# Patient Record
Sex: Female | Born: 1985 | Race: White | Hispanic: No | Marital: Married | Smoking: Never smoker
Health system: Southern US, Community
[De-identification: ages and names within clinical notes are randomized; demographics above are authoritative.]

## PROBLEM LIST (undated history)

## (undated) DIAGNOSIS — N200 Calculus of kidney: Secondary | ICD-10-CM

## (undated) DIAGNOSIS — N809 Endometriosis, unspecified: Secondary | ICD-10-CM

## (undated) DIAGNOSIS — F329 Major depressive disorder, single episode, unspecified: Secondary | ICD-10-CM

## (undated) DIAGNOSIS — K5190 Ulcerative colitis, unspecified, without complications: Secondary | ICD-10-CM

## (undated) DIAGNOSIS — K649 Unspecified hemorrhoids: Secondary | ICD-10-CM

## (undated) DIAGNOSIS — R519 Headache, unspecified: Secondary | ICD-10-CM

## (undated) DIAGNOSIS — R51 Headache: Secondary | ICD-10-CM

## (undated) DIAGNOSIS — R112 Nausea with vomiting, unspecified: Secondary | ICD-10-CM

## (undated) DIAGNOSIS — K219 Gastro-esophageal reflux disease without esophagitis: Secondary | ICD-10-CM

## (undated) DIAGNOSIS — N189 Chronic kidney disease, unspecified: Secondary | ICD-10-CM

## (undated) DIAGNOSIS — Z87442 Personal history of urinary calculi: Secondary | ICD-10-CM

## (undated) DIAGNOSIS — R109 Unspecified abdominal pain: Secondary | ICD-10-CM

## (undated) DIAGNOSIS — F419 Anxiety disorder, unspecified: Secondary | ICD-10-CM

## (undated) DIAGNOSIS — G43909 Migraine, unspecified, not intractable, without status migrainosus: Secondary | ICD-10-CM

## (undated) DIAGNOSIS — Z9889 Other specified postprocedural states: Secondary | ICD-10-CM

## (undated) DIAGNOSIS — T7840XA Allergy, unspecified, initial encounter: Secondary | ICD-10-CM

## (undated) DIAGNOSIS — F32A Depression, unspecified: Secondary | ICD-10-CM

## (undated) HISTORY — DX: Endometriosis, unspecified: N80.9

## (undated) HISTORY — DX: Calculus of kidney: N20.0

## (undated) HISTORY — DX: Major depressive disorder, single episode, unspecified: F32.9

## (undated) HISTORY — PX: DIAGNOSTIC LAPAROSCOPY: SUR761

## (undated) HISTORY — PX: TONSILLECTOMY: SHX5217

## (undated) HISTORY — PX: COSMETIC SURGERY: SHX468

## (undated) HISTORY — DX: Unspecified abdominal pain: R10.9

## (undated) HISTORY — DX: Migraine, unspecified, not intractable, without status migrainosus: G43.909

## (undated) HISTORY — DX: Ulcerative colitis, unspecified, without complications: K51.90

## (undated) HISTORY — DX: Anxiety disorder, unspecified: F41.9

## (undated) HISTORY — PX: TONSILLECTOMY: SUR1361

## (undated) HISTORY — DX: Gastro-esophageal reflux disease without esophagitis: K21.9

## (undated) HISTORY — DX: Depression, unspecified: F32.A

## (undated) HISTORY — DX: Allergy, unspecified, initial encounter: T78.40XA

## (undated) HISTORY — PX: ABDOMINAL HYSTERECTOMY: SHX81

## (undated) HISTORY — DX: Unspecified hemorrhoids: K64.9

## (undated) HISTORY — DX: Chronic kidney disease, unspecified: N18.9

---
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
## 2005-02-14 ENCOUNTER — Ambulatory Visit: Payer: Self-pay | Admitting: General Surgery

## 2006-05-23 ENCOUNTER — Ambulatory Visit: Payer: Self-pay | Admitting: Unknown Physician Specialty

## 2006-11-23 ENCOUNTER — Ambulatory Visit: Payer: Self-pay

## 2007-01-18 HISTORY — PX: PELVIC LAPAROSCOPY: SHX162

## 2007-07-13 ENCOUNTER — Ambulatory Visit: Payer: Self-pay | Admitting: Unknown Physician Specialty

## 2007-07-26 ENCOUNTER — Ambulatory Visit: Payer: Self-pay | Admitting: Unknown Physician Specialty

## 2007-11-30 ENCOUNTER — Ambulatory Visit: Payer: Self-pay | Admitting: Obstetrics and Gynecology

## 2007-12-06 ENCOUNTER — Ambulatory Visit: Payer: Self-pay | Admitting: Obstetrics and Gynecology

## 2008-08-17 HISTORY — PX: BUNIONECTOMY: SHX129

## 2008-11-04 ENCOUNTER — Ambulatory Visit: Payer: Self-pay | Admitting: Otolaryngology

## 2009-04-08 ENCOUNTER — Ambulatory Visit: Payer: Self-pay | Admitting: Unknown Physician Specialty

## 2011-01-18 HISTORY — PX: DILATION AND CURETTAGE OF UTERUS: SHX78

## 2011-01-18 HISTORY — PX: APPENDECTOMY: SHX54

## 2011-03-03 ENCOUNTER — Ambulatory Visit: Payer: Self-pay

## 2011-03-03 LAB — HCG, QUANTITATIVE, PREGNANCY: Beta Hcg, Quant.: <1 m[IU]/mL — ABNORMAL LOW

## 2011-03-10 ENCOUNTER — Ambulatory Visit: Payer: Self-pay

## 2011-03-14 LAB — PATHOLOGY REPORT

## 2011-03-29 ENCOUNTER — Ambulatory Visit: Payer: Self-pay | Admitting: General Practice

## 2011-06-24 HISTORY — PX: INTRAUTERINE DEVICE INSERTION: SHX323

## 2012-01-18 HISTORY — PX: COLONOSCOPY: SHX174

## 2012-06-19 ENCOUNTER — Ambulatory Visit: Payer: Self-pay | Admitting: Oncology

## 2012-06-19 LAB — CBC CANCER CENTER
Basophil #: 0.1 x10 3/mm (ref 0.0–0.1)
Eosinophil #: 0.1 x10 3/mm (ref 0.0–0.7)
HGB: 12.9 g/dL (ref 12.0–16.0)
Lymphocyte #: 3.3 x10 3/mm (ref 1.0–3.6)
Platelet: 270 x10 3/mm (ref 150–440)
WBC: 8.1 x10 3/mm (ref 3.6–11.0)

## 2012-06-19 LAB — PROTIME-INR
INR: 0.9
Prothrombin Time: 12.8 secs (ref 11.5–14.7)

## 2012-07-04 ENCOUNTER — Ambulatory Visit (INDEPENDENT_AMBULATORY_CARE_PROVIDER_SITE_OTHER): Payer: BC Managed Care – PPO | Admitting: Certified Nurse Midwife

## 2012-07-04 ENCOUNTER — Encounter: Payer: Self-pay | Admitting: Certified Nurse Midwife

## 2012-07-04 VITALS — BP 90/60 | HR 68 | Temp 98.6°F | Resp 16 | Ht 66.5 in | Wt 127.0 lb

## 2012-07-04 DIAGNOSIS — N760 Acute vaginitis: Secondary | ICD-10-CM

## 2012-07-04 DIAGNOSIS — Z202 Contact with and (suspected) exposure to infections with a predominantly sexual mode of transmission: Secondary | ICD-10-CM

## 2012-07-04 DIAGNOSIS — N390 Urinary tract infection, site not specified: Secondary | ICD-10-CM

## 2012-07-04 LAB — POCT URINALYSIS DIPSTICK
Ketones, UA: NEGATIVE
Leukocytes, UA: NEGATIVE
Protein, UA: NEGATIVE
Urobilinogen, UA: NEGATIVE
pH, UA: 5

## 2012-07-04 NOTE — Progress Notes (Signed)
27 y.o.SingleCaucasian female G0P0000 with a 14 day(s) history of the following:discharge described as grey, milky and with odor and slight vaginal soreness Sexually active: yes Last sexual activity:6 days ago. Pt also reports the following associated symptoms: urinary frequency and urinary urgency Patient has tried Diflucan X 2 she had on hand with minimal relief. History IC so thought urniary symptoms from that.  Denies fever or chills or pain with urination. New partner without condom use, desires GC,Chlamydia use.  Contraception Mirena IUD use  O: Healthy female WDWN Affect: normal, orientation x3 Skin: warm and dry Abdomen: not tender, negative suprapubic    Exam:  [id number]:[id number], Urethra, Skene's normal, slight edema noted of labia with healing abrasions, no lesions                Vag:no lesions, discharge: copious, malodorous and grey in color, pH 5.5, wet prep done                Cx:  normal appearance and non tender IUD string noted                Uterus:non-tender, normal shape and consistency, retroverted                Adnexa: normal adnexa and no mass, fullness, tenderness  Wet Prep shows:clue cells, negative for yeast or trich   A::Bacterial vaginosis 2-Urinary frequency history of IC R/O UTI 3-STD screening 4-Contraception Mirena IUD  P: Reviewed findings and importance of condom use especially with IUD contraception Rx Metrogel see order Lab:GC, Chlamydia Urine culture, aware of signs and symptoms of UTI  Symptomatic local care discussed. Transport planner distributed. Vaginal antibiotic see orders. Discussed safe sex.  RV prn   Reviewed, TL

## 2012-07-06 LAB — IPS N GONORRHOEA AND CHLAMYDIA BY PCR

## 2012-07-06 LAB — URINE CULTURE: Colony Count: NO GROWTH

## 2012-07-17 ENCOUNTER — Ambulatory Visit: Payer: Self-pay | Admitting: Oncology

## 2012-08-03 ENCOUNTER — Ambulatory Visit: Payer: Self-pay | Admitting: General Practice

## 2012-08-13 ENCOUNTER — Encounter: Payer: Self-pay | Admitting: *Deleted

## 2012-08-24 ENCOUNTER — Encounter: Payer: Self-pay | Admitting: Certified Nurse Midwife

## 2012-08-24 ENCOUNTER — Ambulatory Visit (INDEPENDENT_AMBULATORY_CARE_PROVIDER_SITE_OTHER): Payer: BC Managed Care – PPO | Admitting: Certified Nurse Midwife

## 2012-08-24 VITALS — BP 106/56 | HR 74 | Resp 14 | Ht 67.0 in | Wt 128.0 lb

## 2012-08-24 DIAGNOSIS — Z01419 Encounter for gynecological examination (general) (routine) without abnormal findings: Secondary | ICD-10-CM

## 2012-08-24 NOTE — Patient Instructions (Signed)
General topics  Next pap or exam is  due in 1 year Take a Women's multivitamin Take 1200 mg. of calcium daily - prefer dietary If any concerns in interim to call back  Breast Self-Awareness Practicing breast self-awareness may pick up problems early, prevent significant medical complications, and possibly save your life. By practicing breast self-awareness, you can become familiar with how your breasts look and feel and if your breasts are changing. This allows you to notice changes early. It can also offer you some reassurance that your breast health is good. One way to learn what is normal for your breasts and whether your breasts are changing is to do a breast self-exam. If you find a lump or something that was not present in the past, it is best to contact your caregiver right away. Other findings that should be evaluated by your caregiver include nipple discharge, especially if it is bloody; skin changes or reddening; areas where the skin seems to be pulled in (retracted); or new lumps and bumps. Breast pain is seldom associated with cancer (malignancy), but should also be evaluated by a caregiver. BREAST SELF-EXAM The best time to examine your breasts is 5 7 days after your menstrual period is over.  ExitCare Patient Information 2013 [employer].   Exercise to Stay Healthy Exercise helps you become and stay healthy. EXERCISE IDEAS AND TIPS Choose exercises that:  You enjoy.  Fit into your day. You do not need to exercise really hard to be healthy. You can do exercises at a slow or medium level and stay healthy. You can:  Stretch before and after working out.  Try yoga, Pilates, or tai chi.  Lift weights.  Walk fast, swim, jog, run, climb stairs, bicycle, dance, or rollerskate.  Take aerobic classes. Exercises that burn about 150 calories:  Running 1  miles in 15 minutes.  Playing volleyball for 45 to 60 minutes.  Washing and waxing a car for 45 to 60  minutes.  Playing touch football for 45 minutes.  Walking 1  miles in 35 minutes.  Pushing a stroller 1  miles in 30 minutes.  Playing basketball for 30 minutes.  Raking leaves for 30 minutes.  Bicycling 5 miles in 30 minutes.  Walking 2 miles in 30 minutes.  Dancing for 30 minutes.  Shoveling snow for 15 minutes.  Swimming laps for 20 minutes.  Walking up stairs for 15 minutes.  Bicycling 4 miles in 15 minutes.  Gardening for 30 to 45 minutes.  Jumping rope for 15 minutes.  Washing windows or floors for 45 to 60 minutes. Document Released: 02/05/2010 Document Revised: 03/28/2011 Document Reviewed: 02/05/2010 ExitCare Patient Information 2013 [employer].   Other topics ( that may be useful information):    Sexually Transmitted Disease Sexually transmitted disease (STD) refers to any infection that is passed from person to person during sexual activity. This may happen by way of saliva, semen, blood, vaginal mucus, or urine. Common STDs include:  Gonorrhea.  Chlamydia.  Syphilis.  HIV/AIDS.  Genital herpes.  Hepatitis B and C.  Trichomonas.  Human papillomavirus (HPV).  Pubic lice. CAUSES  An STD may be spread by bacteria, virus, or parasite. A person can get an STD by:  Sexual intercourse with an infected person.  Sharing sex toys with an infected person.  Sharing needles with an infected person.  Having intimate contact with the genitals, mouth, or rectal areas of an infected person. SYMPTOMS  Some people may not have any symptoms, but   they can still pass the infection to others. Different STDs have different symptoms. Symptoms include:  Painful or bloody urination.  Pain in the pelvis, abdomen, vagina, anus, throat, or eyes.  Skin rash, itching, irritation, growths, or sores (lesions). These usually occur in the genital or anal area.  Abnormal vaginal discharge.  Penile discharge in men.  Soft, flesh-colored skin growths in the  genital or anal area.  Fever.  Pain or bleeding during sexual intercourse.  Swollen glands in the groin area.  Yellow skin and eyes (jaundice). This is seen with hepatitis. DIAGNOSIS  To make a diagnosis, your caregiver may:  Take a medical history.  Perform a physical exam.  Take a specimen (culture) to be examined.  Examine a sample of discharge under a microscope.  Perform blood test TREATMENT   Chlamydia, gonorrhea, trichomonas, and syphilis can be cured with antibiotic medicine.  Genital herpes, hepatitis, and HIV can be treated, but not cured, with prescribed medicines. The medicines will lessen the symptoms.  Genital warts from HPV can be treated with medicine or by freezing, burning (electrocautery), or surgery. Warts may come back.  HPV is a virus and cannot be cured with medicine or surgery.However, abnormal areas may be followed very closely by your caregiver and may be removed from the cervix, vagina, or vulva through office procedures or surgery. If your diagnosis is confirmed, your recent sexual partners need treatment. This is true even if they are symptom-free or have a negative culture or evaluation. They should not have sex until their caregiver says it is okay. HOME CARE INSTRUCTIONS  All sexual partners should be informed, tested, and treated for all STDs.  Take your antibiotics as directed. Finish them even if you start to feel better.  Only take over-the-counter or prescription medicines for pain, discomfort, or fever as directed by your caregiver.  Rest.  Eat a balanced diet and drink enough fluids to keep your urine clear or pale yellow.  Do not have sex until treatment is completed and you have followed up with your caregiver. STDs should be checked after treatment.  Keep all follow-up appointments, Pap tests, and blood tests as directed by your caregiver.  Only use latex condoms and water-soluble lubricants during sexual activity. Do not use  petroleum jelly or oils.  Avoid alcohol and illegal drugs.  Get vaccinated for HPV and hepatitis. If you have not received these vaccines in the past, talk to your caregiver about whether one or both might be right for you.  Avoid risky sex practices that can break the skin. The only way to avoid getting an STD is to avoid all sexual activity.Latex condoms and dental dams (for oral sex) will help lessen the risk of getting an STD, but will not completely eliminate the risk. SEEK MEDICAL CARE IF:   You have a fever.  You have any new or worsening symptoms. Document Released: 03/26/2002 Document Revised: 03/28/2011 Document Reviewed: 04/02/2010 ExitCare Patient Information 2013 [employer].    Domestic Abuse You are being battered or abused if someone close to you hits, pushes, or physically hurts you in any way. You also are being abused if you are forced into activities. You are being sexually abused if you are forced to have sexual contact of any kind. You are being emotionally abused if you are made to feel worthless or if you are constantly threatened. It is important to remember that help is available. No one has the right to abuse you. PREVENTION OF FURTHER   ABUSE  Learn the warning signs of danger. This varies with situations but may include: the use of alcohol, threats, isolation from friends and family, or forced sexual contact. Leave if you feel that violence is going to occur.  If you are attacked or beaten, report it to the police so the abuse is documented. You do not have to press charges. The police can protect you while you or the attackers are leaving. Get the officer's name and badge number and a copy of the report.  Find someone you can trust and tell them what is happening to you: your caregiver, a nurse, clergy member, close friend or family member. Feeling ashamed is natural, but remember that you have done nothing wrong. No one deserves abuse. Document Released:  01/01/2000 Document Revised: 03/28/2011 Document Reviewed: 03/11/2010 ExitCare Patient Information 2013 [employer].    How Much is Too Much Alcohol? Drinking too much alcohol can cause injury, accidents, and health problems. These types of problems can include:   Car crashes.  Falls.  Family fighting (domestic violence).  Drowning.  Fights.  Injuries.  Burns.  Damage to certain organs.  Having a baby with birth defects. ONE DRINK CAN BE TOO MUCH WHEN YOU ARE:  Working.  Pregnant or breastfeeding.  Taking medicines. Ask your doctor.  Driving or planning to drive. If you or someone you know has a drinking problem, get help from a doctor.  Document Released: 10/30/2008 Document Revised: 03/28/2011 Document Reviewed: 10/30/2008 ExitCare Patient Information 2013 [employer].   Smoking Hazards Smoking cigarettes is extremely bad for your health. Tobacco smoke has over 200 known poisons in it. There are over 60 chemicals in tobacco smoke that cause cancer. Some of the chemicals found in cigarette smoke include:   Cyanide.  Benzene.  Formaldehyde.  Methanol (wood alcohol).  Acetylene (fuel used in welding torches).  Ammonia. Cigarette smoke also contains the poisonous gases nitrogen oxide and carbon monoxide.  Cigarette smokers have an increased risk of many serious medical problems and Smoking causes approximately:  90% of all lung cancer deaths in men.  80% of all lung cancer deaths in women.  90% of deaths from chronic obstructive lung disease. Compared with nonsmokers, smoking increases the risk of:  Coronary heart disease by 2 to 4 times.  Stroke by 2 to 4 times.  Men developing lung cancer by 23 times.  Women developing lung cancer by 13 times.  Dying from chronic obstructive lung diseases by 12 times.  . Smoking is the most preventable cause of death and disease in our society.  WHY IS SMOKING ADDICTIVE?  Nicotine is the chemical  agent in tobacco that is capable of causing addiction or dependence.  When you smoke and inhale, nicotine is absorbed rapidly into the bloodstream through your lungs. Nicotine absorbed through the lungs is capable of creating a powerful addiction. Both inhaled and non-inhaled nicotine may be addictive.  Addiction studies of cigarettes and spit tobacco show that addiction to nicotine occurs mainly during the teen years, when young people begin using tobacco products. WHAT ARE THE BENEFITS OF QUITTING?  There are many health benefits to quitting smoking.   Likelihood of developing cancer and heart disease decreases. Health improvements are seen almost immediately.  Blood pressure, pulse rate, and breathing patterns start returning to normal soon after quitting. QUITTING SMOKING   American Lung Association - 1-800-LUNGUSA  American Cancer Society - 1-800-ACS-2345 Document Released: 02/11/2004 Document Revised: 03/28/2011 Document Reviewed: 10/15/2008 ExitCare Patient Information 2013 ExitCare[employer]   LL[employer].   Stress Management Stress is a state of physical or mental tension that often results from changes in your life or normal routine. Some common causes of stress are:  Death of a loved one.  Injuries or severe illnesses.  Getting fired or changing jobs.  Moving into a new home. Other causes may be:  Sexual problems.  Business or financial losses.  Taking on a large debt.  Regular conflict with someone at home or at work.  Constant tiredness from lack of sleep. It is not just bad things that are stressful. It may be stressful to:  Win the lottery.  Get married.  Buy a new car. The amount of stress that can be easily tolerated varies from person to person. Changes generally cause stress, regardless of the types of change. Too much stress can affect your health. It may lead to physical or emotional problems. Too little stress (boredom) may also become stressful. SUGGESTIONS TO  REDUCE STRESS:  Talk things over with your family and friends. It often is helpful to share your concerns and worries. If you feel your problem is serious, you may want to get help from a professional counselor.  Consider your problems one at a time instead of lumping them all together. Trying to take care of everything at once may seem impossible. List all the things you need to do and then start with the most important one. Set a goal to accomplish 2 or 3 things each day. If you expect to do too many in a single day you will naturally fail, causing you to feel even more stressed.  Do not use alcohol or drugs to relieve stress. Although you may feel better for a short time, they do not remove the problems that caused the stress. They can also be habit forming.  Exercise regularly - at least 3 times per week. Physical exercise can help to relieve that "uptight" feeling and will relax you.  The shortest distance between despair and hope is often a good night's sleep.  Go to bed and get up on time allowing yourself time for appointments without being rushed.  Take a short "time-out" period from any stressful situation that occurs during the day. Close your eyes and take some deep breaths. Starting with the muscles in your face, tense them, hold it for a few seconds, then relax. Repeat this with the muscles in your neck, shoulders, hand, stomach, back and legs.  Take good care of yourself. Eat a balanced diet and get plenty of rest.  Schedule time for having fun. Take a break from your daily routine to relax. HOME CARE INSTRUCTIONS   Call if you feel overwhelmed by your problems and feel you can no longer manage them on your own.  Return immediately if you feel like hurting yourself or someone else. Document Released: 06/29/2000 Document Revised: 03/28/2011 Document Reviewed: 02/19/2007 ExitCare Patient Information 2013 [employer].   

## 2012-08-24 NOTE — Progress Notes (Signed)
27 y.o. G0P0000 Single Caucasian Fe here for annual exam. Periods so much better. No cramping at all!!  No partner at present, no STD concerns, screened in JU[place] 2014 negative. Happy with IUD choice. No. health issues today.  No LMP recorded.     Per patient 3/14     Sexually active: yes  The current method of family planning is OCP (estrogen/progesterone) and IUD.    Exercising: yes  cardio, weight training 5x/wk Smoker:  no  Health Maintenance: Pap:  08/17/11-NEG MMG:  none Colonoscopy:  03/2012 BMD:   none TDaP:  02/2010 Labs: PCP   reports that she has never smoked. She does not have any smokeless tobacco history on file. She reports that she does not drink alcohol or use illicit drugs.  Past Medical History  Diagnosis Date  . Ulcerative colitis   . Endometriosis   . GERD (gastroesophageal reflux disease)   . IC (interstitial cystitis)     Past Surgical History  Procedure Laterality Date  . Bunionectomy  08, 10  . Tonsillectomy    . Hernia repair  27  . Dilation and curettage of uterus  2013    polyp  . Appendectomy  2013    with laparoscopy @ [facility]  . Pelvic laparoscopy  2009    times 2    Current Outpatient Prescriptions  Medication Sig Dispense Refill  . ibuprofen (ADVIL,MOTRIN) 200 MG tablet Take 200 mg by mouth every 6 (six) hours as needed for pain.      Marland Kitchen levonorgestrel (MIRENA) 20 MCG/24HR IUD 1 each by Intrauterine route once.      . Meth-Hyo-M Bl-Na Phos-Ph Sal (URIBEL PO) Take by mouth as needed.      . Norethindrone-Ethinyl Estradiol-Fe Biphas (LO LOESTRIN FE) 1 MG-10 MCG / 10 MCG tablet Take 1 tablet by mouth daily.      . pantoprazole (PROTONIX) 40 MG tablet Take 40 mg by mouth daily as needed.       No current facility-administered medications for this visit.    Family History  Problem Relation Age of Onset  . Breast cancer Mother 48  . Cancer Paternal Grandmother     colon  . Diabetes Paternal Grandmother     ROS:  Pertinent items are  noted in HPI.  Otherwise, a comprehensive ROS was negative.  Exam:   There were no vitals taken for this visit.    Ht Readings from Last 3 Encounters:  07/04/12 5' 6.5" (1.689 m)    General appearance: alert, cooperative and appears stated age Head: Normocephalic, without obvious abnormality, atraumatic Neck: no adenopathy, supple, symmetrical, trachea midline and thyroid normal to inspection and palpation Lungs: clear to auscultation bilaterally Breasts: normal appearance, no masses or tenderness, No nipple retraction or dimpling, No nipple discharge or bleeding, No axillary or supraclavicular adenopathy Heart: regular rate and rhythm Abdomen: soft, non-tender; no masses,  no organomegaly Extremities: extremities normal, atraumatic, no cyanosis or edema Skin: Skin color, texture, turgor normal. No rashes or lesions Lymph nodes: Cervical, supraclavicular, and axillary nodes normal. No abnormal inguinal nodes palpated Neurologic: Grossly normal   Pelvic: External genitalia:  no lesions              Urethra:  normal appearing urethra with no masses, tenderness or lesions              Bartholin's and Skene's: normal                 Vagina: normal appearing  vagina with normal color and discharge, no lesions              Cervix: normal, IUD string visualized              Pap taken: no Bimanual Exam:  Uterus:  normal size, contour, position, consistency, mobility, non-tender and anteverted              Adnexa: normal adnexa and no mass, fullness, tenderness               Rectovaginal: Confirms               Anus:  normal sphincter tone, no lesions  A:  Well Woman with normal exam  Contraception Mirena IUD  History of Menorrhagia on OCP with PCP in her area   for  Management    P:  Reviewed health and wellness pertinent to exam  Aware of warning signs with IUD  Declines Rx  Pap smear as per guidelines   pap smear not taken today  counseled on breast self exam, STD prevention,  use and side effects of OCP's, adequate intake of calcium and vitamin D, diet and exercise  return annually or prn  An After Visit Summary was printed and given to the patient.

## 2012-08-25 NOTE — Progress Notes (Signed)
Note reviewed, agree with plan.  Maaliyah Adolph, MD  

## 2012-08-29 ENCOUNTER — Encounter: Payer: Self-pay | Admitting: General Surgery

## 2012-08-29 ENCOUNTER — Ambulatory Visit (INDEPENDENT_AMBULATORY_CARE_PROVIDER_SITE_OTHER): Payer: BC Managed Care – PPO | Admitting: General Surgery

## 2012-08-29 VITALS — BP 100/68 | HR 74 | Resp 12 | Ht 67.0 in | Wt 129.0 lb

## 2012-08-29 DIAGNOSIS — R109 Unspecified abdominal pain: Secondary | ICD-10-CM | POA: Insufficient documentation

## 2012-08-29 DIAGNOSIS — K649 Unspecified hemorrhoids: Secondary | ICD-10-CM | POA: Insufficient documentation

## 2012-08-29 DIAGNOSIS — R1011 Right upper quadrant pain: Secondary | ICD-10-CM

## 2012-08-29 NOTE — Progress Notes (Signed)
Patient ID: Bethany Harrison, female   DOB: 22-Apr-1985, 27 y.o.   MRN: [id number]  Chief Complaint  Patient presents with  . Other    Pain in old surgical site of abdomen    HPI Bethany Harrison is a 27 y.o. female who presents for an evaluation of abdomen pain located in old surgical site. The surgical site is from laparoscopic surgery for endometriosis.The patient states this has been ongoing for approximately 1 year. The pain comes and goes. The pain has not gotten worse but continues to to be bothersome.  The patient underwent laparoscopy and incidental appendectomy at [facility] last year. She's had tenderness in the right upper quadrant port site since that time. She reports that she was evaluated by the treating physician on at least 2 occasions without any satisfactory explanation.  The discomfort is local, usually worse with vigorous activities such as exercise.  She has had no GI symptoms.  HPI  Past Medical History  Diagnosis Date  . Ulcerative colitis   . Endometriosis   . GERD (gastroesophageal reflux disease)   . IC (interstitial cystitis)   . Kidney stone   . Hemorrhoid   . Abdominal pain     Past Surgical History  Procedure Laterality Date  . Bunionectomy  08, 10  . Tonsillectomy    . Hernia repair  47  . Dilation and curettage of uterus  2013    polyp  . Appendectomy  2013    with laparoscopy @ [facility]  . Pelvic laparoscopy  2009    times 2  . Colonoscopy  2014    Family History  Problem Relation Age of Onset  . Breast cancer Mother 88  . Cancer Paternal Grandmother     colon  . Diabetes Paternal Grandmother     Social History History  Substance Use Topics  . Smoking status: Never Smoker   . Smokeless tobacco: Not on file  . Alcohol Use: Yes    Allergies  Allergen Reactions  . Sulfa Antibiotics Nausea Only    Current Outpatient Prescriptions  Medication Sig Dispense Refill  . levonorgestrel (MIRENA) 20 MCG/24HR IUD 1 each by Intrauterine  route once.      . Meth-Hyo-M Bl-Na Phos-Ph Sal (URIBEL PO) Take by mouth as needed.      . Norethindrone-Ethinyl Estradiol-Fe Biphas (LO LOESTRIN FE) 1 MG-10 MCG / 10 MCG tablet Take 1 tablet by mouth daily.      . pantoprazole (PROTONIX) 40 MG tablet Take 40 mg by mouth daily as needed.       No current facility-administered medications for this visit.    Review of Systems Review of Systems  Constitutional: Negative.   Respiratory: Negative.   Cardiovascular: Negative.   Gastrointestinal: Positive for abdominal pain.    Blood pressure 100/68, pulse 74, resp. rate 12, height 5' 7"  (1.702 m), weight 129 lb (58.514 kg).  Physical Exam Physical Exam  Constitutional: She is oriented to person, place, and time. She appears well-developed and well-nourished.  Neck: No thyromegaly present.  Cardiovascular: Normal rate, regular rhythm and normal heart sounds.   No murmur heard. Pulmonary/Chest: Effort normal and breath sounds normal.  Abdominal: Soft. Normal appearance and bowel sounds are normal.  Lymphadenopathy:    She has no cervical adenopathy.  Neurological: She is alert and oriented to person, place, and time.  Skin: Skin is warm and dry.  The port sites are all well healed without focal thickening or point tenderness. No evidence of abdominal  wall hernias.  Data Reviewed CT scan of the abdomen and pelvis dated July 04, 2012 was reviewed. No area of soft tissue abnormality or muscular abnormality in the area of the right upper quadrant port site was identified. Mild changes are appreciated the hypogastric site. No bowel hernias were identified.  Assessment    Abdominal wall pain likely secondary to scarring between the lateral bowel wall musculature.     Plan    Observation for management were reviewed: 1) observation versus 2) local corticosteroid injection. The patient reports the pain is not severe enough at this time toward injection.  She contact the office if she has  any concerns.        Robert Bellow 08/29/2012, 10:45 PM

## 2012-08-29 NOTE — Patient Instructions (Signed)
Patient to return as needed. 

## 2012-11-22 ENCOUNTER — Other Ambulatory Visit: Payer: Self-pay

## 2013-03-15 ENCOUNTER — Emergency Department: Payer: Self-pay | Admitting: Emergency Medicine

## 2013-07-22 ENCOUNTER — Ambulatory Visit (INDEPENDENT_AMBULATORY_CARE_PROVIDER_SITE_OTHER): Payer: BC Managed Care – PPO | Admitting: Nurse Practitioner

## 2013-07-22 ENCOUNTER — Encounter: Payer: Self-pay | Admitting: Nurse Practitioner

## 2013-07-22 VITALS — BP 110/66 | HR 80 | Temp 99.0°F | Ht 67.0 in | Wt 121.0 lb

## 2013-07-22 DIAGNOSIS — N760 Acute vaginitis: Secondary | ICD-10-CM

## 2013-07-22 DIAGNOSIS — A499 Bacterial infection, unspecified: Secondary | ICD-10-CM

## 2013-07-22 DIAGNOSIS — B9689 Other specified bacterial agents as the cause of diseases classified elsewhere: Secondary | ICD-10-CM

## 2013-07-22 MED ORDER — METRONIDAZOLE 0.75 % VA GEL: 1.0000 | Freq: Every day | VAGINAL | Status: DC

## 2013-07-22 NOTE — Patient Instructions (Signed)
Bacterial Vaginosis Bacterial vaginosis is a vaginal infection that occurs when the normal balance of bacteria in the vagina is disrupted. It results from an overgrowth of certain bacteria. This is the most common vaginal infection in women of childbearing age. Treatment is important to prevent complications, especially in pregnant women, as it can cause a premature delivery. CAUSES  Bacterial vaginosis is caused by an increase in harmful bacteria that are normally present in smaller amounts in the vagina. Several different kinds of bacteria can cause bacterial vaginosis. However, the reason that the condition develops is not fully understood. RISK FACTORS Certain activities or behaviors can put you at an increased risk of developing bacterial vaginosis, including:  Having a new sex partner or multiple sex partners.  Douching.  Using an intrauterine device (IUD) for contraception. Women do not get bacterial vaginosis from toilet seats, bedding, swimming pools, or contact with objects around them. SIGNS AND SYMPTOMS  Some women with bacterial vaginosis have no signs or symptoms. Common symptoms include:  Grey vaginal discharge.  A fishlike odor with discharge, especially after sexual intercourse.  Itching or burning of the vagina and vulva.  Burning or pain with urination. DIAGNOSIS  Your health care provider will take a medical history and examine the vagina for signs of bacterial vaginosis. A sample of vaginal fluid may be taken. Your health care provider will look at this sample under a microscope to check for bacteria and abnormal cells. A vaginal pH test may also be done.  TREATMENT  Bacterial vaginosis may be treated with antibiotic medicines. These may be given in the form of a pill or a vaginal cream. A second round of antibiotics may be prescribed if the condition comes back after treatment.  HOME CARE INSTRUCTIONS   Only take over-the-counter or prescription medicines as  directed by your health care provider.  If antibiotic medicine was prescribed, take it as directed. Make sure you finish it even if you start to feel better.  Do not have sex until treatment is completed.  Tell all sexual partners that you have a vaginal infection. They should see their health care provider and be treated if they have problems, such as a mild rash or itching.  Practice safe sex by using condoms and only having one sex partner. SEEK MEDICAL CARE IF:   Your symptoms are not improving after 3 days of treatment.  You have increased discharge or pain.  You have a fever. MAKE SURE YOU:   Understand these instructions.  Will watch your condition.  Will get help right away if you are not doing well or get worse. FOR MORE INFORMATION  Centers for Disease Control and Prevention, Division of STD Prevention: www.cdc.gov/std American Sexual Health Association (ASHA): www.ashastd.org  Document Released: 01/03/2005 Document Revised: 10/24/2012 Document Reviewed: 08/15/2012 [employer] Patient Information 2015 [employer]. This information is not intended to replace advice given to you by your health care provider. Make sure you discuss any questions you have with your health care provider.  

## 2013-07-22 NOTE — Progress Notes (Signed)
28 y.o.Single Caucasian female [id number] with a 6 day(s) history of the following:discharge described as white, clear and and always spotting since IUD and local irritation.   Sexually active: Yes.   Last sexual activity:2 weeks ago.   No change in partner and always uses condoms.  No past problems with condoms or latex allergy.  She has these same symptoms usually every summer when she is busy and getting hot.  Pt also reports the following associated symptoms: none.  Has no UTI symptoms, fever or chills.   Patient has tried over the counter oatmeal baths x 2 days treatment with minimal relief.     Exam:  ERD:EYCXKG                YJE:HUDJSHFWY: scant, bloody and thin, blood, wet prep done                Cx:  normal appearance, IUD string visible                Uterus:normal size                Adnexa: no mass, fullness, tenderness  Wet Prep shows:PH: 5.5; NSS: + clue cells and RBC's, KOH: no yeast   Dx:     Bacterial Vaginosis  Mirena IUD   TX   :Vaginal antibiotic see orders.  Metrogel vaginal cream HS X 5  She has Diflucan RX on hand in case of yeast vaginitis

## 2013-07-23 NOTE — Progress Notes (Signed)
Encounter reviewed by Dr. Brook Silva.  

## 2013-07-26 ENCOUNTER — Telehealth: Payer: Self-pay | Admitting: Certified Nurse Midwife

## 2013-07-26 NOTE — Telephone Encounter (Signed)
Pt was seen on 07/22/13 and treated for bacterial infection and feels like she is not better after using the gel is this normal?

## 2013-07-26 NOTE — Telephone Encounter (Signed)
Spoke with patient. Advised patient will need to complete full five says of metrogel and that relief will be gradual. Advised we are treating with medication for one week and it may take body another week to regain balance. Advised is patient is still having symptoms after this point or if symptoms are worsening to give [place] a call back. Patient agreeable and verbalizes understanding.  Routing to provider for final review. Patient agreeable to disposition. Will close encounter

## 2013-07-30 ENCOUNTER — Telehealth: Payer: Self-pay | Admitting: *Deleted

## 2013-07-30 NOTE — Telephone Encounter (Signed)
Patient calling to speak with nurse about her symptoms "still not getting any better." Please advise.

## 2013-07-30 NOTE — Telephone Encounter (Signed)
She needs to use her Diflucan (she has on hand) as it sound like she has a yeast vaginitis following the Metrogel.  If needs RX for 2 tabs of Diflucan OK.  Again if not better to call back.

## 2013-07-30 NOTE — Telephone Encounter (Signed)
See next phone note for triage info since this encounter is already closed.

## 2013-07-30 NOTE — Telephone Encounter (Signed)
See phone note from 07-26-13 for prev call info. Patient seen 07-22-13 with Patty and given Metrogel X5 nights. She called 07-26-13 to say she was not better. She was instructed to give it more time and call back if not better. Patient calling today and states she is better but certainly not resolved. Thick white vaginal discharge, external irrtiation and discomfort with wiping.  Denies itching unless area is touched. Very irritated and tender. Denies pain or fever.  Please advise.

## 2013-07-30 NOTE — Telephone Encounter (Signed)
Call to patient with Bethany Harrison's instruction. Patient states she is on Diflucan from PCP for another issue.  Takes 2 tabs of 150mg  q week for three weeks, total of 6 tabs. Took this last Thursday and is scheduled to repeat it this Thursday and next. Advised since already treating with diflucan, may not need to do anything else. May need to give more time to allow this to clear and all meds to work. Will review with Bethany Harrison and call her back if additional instructions.   Please advise.

## 2013-08-01 NOTE — Telephone Encounter (Signed)
Patty out of office till Monday 08-05-13. Routed to dr Sabra Heck for review. Anything else needed?

## 2013-08-01 NOTE — Telephone Encounter (Signed)
Patty, was there any other info you wanted to tell patient. She is on weekly Diflucan so i did not give her any additional.

## 2013-08-02 ENCOUNTER — Ambulatory Visit (INDEPENDENT_AMBULATORY_CARE_PROVIDER_SITE_OTHER): Payer: BC Managed Care – PPO | Admitting: Certified Nurse Midwife

## 2013-08-02 ENCOUNTER — Encounter: Payer: Self-pay | Admitting: Certified Nurse Midwife

## 2013-08-02 VITALS — BP 110/74 | HR 68 | Resp 16 | Ht 67.0 in | Wt 122.0 lb

## 2013-08-02 DIAGNOSIS — N760 Acute vaginitis: Secondary | ICD-10-CM

## 2013-08-02 NOTE — Telephone Encounter (Signed)
Message left to return call to Alean Kromer at [phone].    

## 2013-08-02 NOTE — Progress Notes (Signed)
Reviewed personally.  M. Suzanne Shahzaib Azevedo, MD.  

## 2013-08-02 NOTE — Telephone Encounter (Signed)
Spoke with patient. She continues with vaginal discharge and irriation. Has taken Diflucan x 4.  Agreeable to office visit but cannot come for morning appointment. Offered office visit for 1530 with Regina Eck CNM, patient agreeable.  Routing to Dr. Sabra Heck and Regina Eck CNM  for update. Will close encounter.

## 2013-08-02 NOTE — Telephone Encounter (Signed)
Can you call and see if she wants to be seen today.

## 2013-08-02 NOTE — Progress Notes (Signed)
28 y.o. single white female g0p0 here with complaint of vaginal symptoms of increase discharge. Describes discharge as white with some brownish color, which is normal for her with IUD. Onset of symptoms 3 days ago. Denies new personal products. Patient was treated one week ago for BV with Metrogel and experienced relief of symptoms, but noticed some irritation and white discharge and felt she had a yeast infection. Patient on Diflucan for Tinea Vesicolor with dermatology. No STD concerns or partner change. Patient treated previous in year for staph infection and feels immune system down with all the medications. Denies abdominal pain or urinary issues   O:Healthy female WDWN Affect: normal, orientation x 3  Exam: Abdomen:non tender Lymph node: no enlargement or tenderness Pelvic exam: External genital: no redness or tenderness BUS: negative Vagina: scant white brownish non odorous discharge noted. Ph:3.5   ,Wet prep taken Cervix: normal, non tender, IUD string noted Uterus: normal, non tender Adnexa:normal, non tender, no masses or fullness noted   Wet Prep results: negative for yeast or BV, lactobacillus noted   A:BV resolved Normal vaginal discharge   P:Discussed findings of negative results. Patient reassured. Discussed Aveeno or baking soda sitz bath for comfort. Avoid moist clothes or pads for extended period of time. If working out in gym clothes or swim suits for long periods of time change underwear or bottoms of swimsuit if possible. Patient encouraged to limit thong use (wears all the time)  Encouraged starting on oral refrigerated probiotic daily to increase immune factors. Questions addressed  Rv prn

## 2013-08-17 LAB — HM MAMMOGRAPHY

## 2013-08-27 ENCOUNTER — Other Ambulatory Visit: Payer: Self-pay

## 2013-08-27 ENCOUNTER — Ambulatory Visit (INDEPENDENT_AMBULATORY_CARE_PROVIDER_SITE_OTHER): Payer: BC Managed Care – PPO | Admitting: Certified Nurse Midwife

## 2013-08-27 ENCOUNTER — Encounter: Payer: Self-pay | Admitting: Certified Nurse Midwife

## 2013-08-27 VITALS — BP 110/70 | HR 72 | Resp 16 | Ht 66.75 in | Wt 124.0 lb

## 2013-08-27 DIAGNOSIS — N6320 Unspecified lump in the left breast, unspecified quadrant: Secondary | ICD-10-CM

## 2013-08-27 DIAGNOSIS — Z01419 Encounter for gynecological examination (general) (routine) without abnormal findings: Secondary | ICD-10-CM

## 2013-08-27 DIAGNOSIS — N63 Unspecified lump in unspecified breast: Secondary | ICD-10-CM

## 2013-08-27 DIAGNOSIS — Z124 Encounter for screening for malignant neoplasm of cervix: Secondary | ICD-10-CM

## 2013-08-27 DIAGNOSIS — Z0000 Encounter for general adult medical examination without abnormal findings: Secondary | ICD-10-CM

## 2013-08-27 NOTE — Patient Instructions (Addendum)
General topics  Next pap or exam is  due in 1 year Take a Women's multivitamin Take 1200 mg. of calcium daily - prefer dietary If any concerns in interim to call back  Breast Self-Awareness Practicing breast self-awareness may pick up problems early, prevent significant medical complications, and possibly save your life. By practicing breast self-awareness, you can become familiar with how your breasts look and feel and if your breasts are changing. This allows you to notice changes early. It can also offer you some reassurance that your breast health is good. One way to learn what is normal for your breasts and whether your breasts are changing is to do a breast self-exam. If you find a lump or something that was not present in the past, it is best to contact your caregiver right away. Other findings that should be evaluated by your caregiver include nipple discharge, especially if it is bloody; skin changes or reddening; areas where the skin seems to be pulled in (retracted); or new lumps and bumps. Breast pain is seldom associated with cancer (malignancy), but should also be evaluated by a caregiver. BREAST SELF-EXAM The best time to examine your breasts is 5 7 days after your menstrual period is over.  ExitCare Patient Information 2013 [employer].   Exercise to Stay Healthy Exercise helps you become and stay healthy. EXERCISE IDEAS AND TIPS Choose exercises that:  You enjoy.  Fit into your day. You do not need to exercise really hard to be healthy. You can do exercises at a slow or medium level and stay healthy. You can:  Stretch before and after working out.  Try yoga, Pilates, or tai chi.  Lift weights.  Walk fast, swim, jog, run, climb stairs, bicycle, dance, or rollerskate.  Take aerobic classes. Exercises that burn about 150 calories:  Running 1  miles in 15 minutes.  Playing volleyball for 45 to 60 minutes.  Washing and waxing a car for 45 to 60  minutes.  Playing touch football for 45 minutes.  Walking 1  miles in 35 minutes.  Pushing a stroller 1  miles in 30 minutes.  Playing basketball for 30 minutes.  Raking leaves for 30 minutes.  Bicycling 5 miles in 30 minutes.  Walking 2 miles in 30 minutes.  Dancing for 30 minutes.  Shoveling snow for 15 minutes.  Swimming laps for 20 minutes.  Walking up stairs for 15 minutes.  Bicycling 4 miles in 15 minutes.  Gardening for 30 to 45 minutes.  Jumping rope for 15 minutes.  Washing windows or floors for 45 to 60 minutes. Document Released: 02/05/2010 Document Revised: 03/28/2011 Document Reviewed: 02/05/2010 [employer] Patient Information 2013 [employer].   Other topics ( that may be useful information):    Sexually Transmitted Disease Sexually transmitted disease (STD) refers to any infection that is passed from person to person during sexual activity. This may happen by way of saliva, semen, blood, vaginal mucus, or urine. Common STDs include:  Gonorrhea.  Chlamydia.  Syphilis.  HIV/AIDS.  Genital herpes.  Hepatitis B and C.  Trichomonas.  Human papillomavirus (HPV).  Pubic lice. CAUSES  An STD may be spread by bacteria, virus, or parasite. A person can get an STD by:  Sexual intercourse with an infected person.  Sharing sex toys with an infected person.  Sharing needles with an infected person.  Having intimate contact with the genitals, mouth, or rectal areas of an infected person. SYMPTOMS  Some people may not have any symptoms, but   they can still pass the infection to others. Different STDs have different symptoms. Symptoms include:  Painful or bloody urination.  Pain in the pelvis, abdomen, vagina, anus, throat, or eyes.  Skin rash, itching, irritation, growths, or sores (lesions). These usually occur in the genital or anal area.  Abnormal vaginal discharge.  Penile discharge in men.  Soft, flesh-colored skin growths in the  genital or anal area.  Fever.  Pain or bleeding during sexual intercourse.  Swollen glands in the groin area.  Yellow skin and eyes (jaundice). This is seen with hepatitis. DIAGNOSIS  To make a diagnosis, your caregiver may:  Take a medical history.  Perform a physical exam.  Take a specimen (culture) to be examined.  Examine a sample of discharge under a microscope.  Perform blood test TREATMENT   Chlamydia, gonorrhea, trichomonas, and syphilis can be cured with antibiotic medicine.  Genital herpes, hepatitis, and HIV can be treated, but not cured, with prescribed medicines. The medicines will lessen the symptoms.  Genital warts from HPV can be treated with medicine or by freezing, burning (electrocautery), or surgery. Warts may come back.  HPV is a virus and cannot be cured with medicine or surgery.However, abnormal areas may be followed very closely by your caregiver and may be removed from the cervix, vagina, or vulva through office procedures or surgery. If your diagnosis is confirmed, your recent sexual partners need treatment. This is true even if they are symptom-free or have a negative culture or evaluation. They should not have sex until their caregiver says it is okay. HOME CARE INSTRUCTIONS  All sexual partners should be informed, tested, and treated for all STDs.  Take your antibiotics as directed. Finish them even if you start to feel better.  Only take over-the-counter or prescription medicines for pain, discomfort, or fever as directed by your caregiver.  Rest.  Eat a balanced diet and drink enough fluids to keep your urine clear or pale yellow.  Do not have sex until treatment is completed and you have followed up with your caregiver. STDs should be checked after treatment.  Keep all follow-up appointments, Pap tests, and blood tests as directed by your caregiver.  Only use latex condoms and water-soluble lubricants during sexual activity. Do not use  petroleum jelly or oils.  Avoid alcohol and illegal drugs.  Get vaccinated for HPV and hepatitis. If you have not received these vaccines in the past, talk to your caregiver about whether one or both might be right for you.  Avoid risky sex practices that can break the skin. The only way to avoid getting an STD is to avoid all sexual activity.Latex condoms and dental dams (for oral sex) will help lessen the risk of getting an STD, but will not completely eliminate the risk. SEEK MEDICAL CARE IF:   You have a fever.  You have any new or worsening symptoms. Document Released: 03/26/2002 Document Revised: 03/28/2011 Document Reviewed: 04/02/2010 [facility] Patient Information 2013 [place].    Domestic Abuse You are being battered or abused if someone close to you hits, pushes, or physically hurts you in any way. You also are being abused if you are forced into activities. You are being sexually abused if you are forced to have sexual contact of any kind. You are being emotionally abused if you are made to feel worthless or if you are constantly threatened. It is important to remember that help is available. No one has the right to abuse you. PREVENTION OF FURTHER  ABUSE  Learn the warning signs of danger. This varies with situations but may include: the use of alcohol, threats, isolation from friends and family, or forced sexual contact. Leave if you feel that violence is going to occur.  If you are attacked or beaten, report it to the police so the abuse is documented. You do not have to press charges. The police can protect you while you or the attackers are leaving. Get the officer's name and badge number and a copy of the report.  Find someone you can trust and tell them what is happening to you: your caregiver, a nurse, clergy member, close friend or family member. Feeling ashamed is natural, but remember that you have done nothing wrong. No one deserves abuse. Document Released:  01/01/2000 Document Revised: 03/28/2011 Document Reviewed: 03/11/2010 Exit[employer] Patient Information 2013 [employer].    How Much is Too Much Alcohol? Drinking too much alcohol can cause injury, accidents, and health problems. These types of problems can include:   Car crashes.  Falls.  Family fighting (domestic violence).  Drowning.  Fights.  Injuries.  Burns.  Damage to certain organs.  Having a baby with birth defects. ONE DRINK CAN BE TOO MUCH WHEN YOU ARE:  Working.  Pregnant or breastfeeding.  Taking medicines. Ask your doctor.  Driving or planning to drive. If you or someone you know has a drinking problem, get help from a doctor.  Document Released: 10/30/2008 Document Revised: 03/28/2011 Document Reviewed: 10/30/2008 Exit[employer] Patient Information 2013 [employer].   Smoking Hazards Smoking cigarettes is extremely bad for your health. Tobacco smoke has over 200 known poisons in it. There are over 60 chemicals in tobacco smoke that cause cancer. Some of the chemicals found in cigarette smoke include:   Cyanide.  Benzene.  Formaldehyde.  Methanol (wood alcohol).  Acetylene (fuel used in welding torches).  Ammonia. Cigarette smoke also contains the poisonous gases nitrogen oxide and carbon monoxide.  Cigarette smokers have an increased risk of many serious medical problems and Smoking causes approximately:  90% of all lung cancer deaths in men.  80% of all lung cancer deaths in women.  90% of deaths from chronic obstructive lung disease. Compared with nonsmokers, smoking increases the risk of:  Coronary heart disease by 2 to 4 times.  Stroke by 2 to 4 times.  Men developing lung cancer by 23 times.  Women developing lung cancer by 13 times.  Dying from chronic obstructive lung diseases by 12 times.  . Smoking is the most preventable cause of death and disease in our society.  WHY IS SMOKING ADDICTIVE?  Nicotine is the chemical  agent in tobacco that is capable of causing addiction or dependence.  When you smoke and inhale, nicotine is absorbed rapidly into the bloodstream through your lungs. Nicotine absorbed through the lungs is capable of creating a powerful addiction. Both inhaled and non-inhaled nicotine may be addictive.  Addiction studies of cigarettes and spit tobacco show that addiction to nicotine occurs mainly during the teen years, when young people begin using tobacco products. WHAT ARE THE BENEFITS OF QUITTING?  There are many health benefits to quitting smoking.   Likelihood of developing cancer and heart disease decreases. Health improvements are seen almost immediately.  Blood pressure, pulse rate, and breathing patterns start returning to normal soon after quitting. QUITTING SMOKING   American Lung Association - 1-800-LUNGUSA  American Cancer Society - 1-800-ACS-2345 Document Released: 02/11/2004 Document Revised: 03/28/2011 Document Reviewed: 10/15/2008 ExitCare Patient Information 2013 ExitCare,   LLC.   Stress Management Stress is a state of physical or mental tension that often results from changes in your life or normal routine. Some common causes of stress are:  Death of a loved one.  Injuries or severe illnesses.  Getting fired or changing jobs.  Moving into a new home. Other causes may be:  Sexual problems.  Business or financial losses.  Taking on a large debt.  Regular conflict with someone at home or at work.  Constant tiredness from lack of sleep. It is not just bad things that are stressful. It may be stressful to:  Win the lottery.  Get married.  Buy a new car. The amount of stress that can be easily tolerated varies from person to person. Changes generally cause stress, regardless of the types of change. Too much stress can affect your health. It may lead to physical or emotional problems. Too little stress (boredom) may also become stressful. SUGGESTIONS TO  REDUCE STRESS:  Talk things over with your family and friends. It often is helpful to share your concerns and worries. If you feel your problem is serious, you may want to get help from a professional counselor.  Consider your problems one at a time instead of lumping them all together. Trying to take care of everything at once may seem impossible. List all the things you need to do and then start with the most important one. Set a goal to accomplish 2 or 3 things each day. If you expect to do too many in a single day you will naturally fail, causing you to feel even more stressed.  Do not use alcohol or drugs to relieve stress. Although you may feel better for a short time, they do not remove the problems that caused the stress. They can also be habit forming.  Exercise regularly - at least 3 times per week. Physical exercise can help to relieve that "uptight" feeling and will relax you.  The shortest distance between despair and hope is often a good night's sleep.  Go to bed and get up on time allowing yourself time for appointments without being rushed.  Take a short "time-out" period from any stressful situation that occurs during the day. Close your eyes and take some deep breaths. Starting with the muscles in your face, tense them, hold it for a few seconds, then relax. Repeat this with the muscles in your neck, shoulders, hand, stomach, back and legs.  Take good care of yourself. Eat a balanced diet and get plenty of rest.  Schedule time for having fun. Take a break from your daily routine to relax. HOME CARE INSTRUCTIONS   Call if you feel overwhelmed by your problems and feel you can no longer manage them on your own.  Return immediately if you feel like hurting yourself or someone else. Document Released: 06/29/2000 Document Revised: 03/28/2011 Document Reviewed: 02/19/2007 [facility] Patient Information 2013 [place].       General topics  Next pap or exam is  due in 1  year Take a Women's multivitamin Take 1200 mg. of calcium daily - prefer dietary If any concerns in interim to call back  Breast Self-Awareness Practicing breast self-awareness may pick up problems early, prevent significant medical complications, and possibly save your life. By practicing breast self-awareness, you can become familiar with how your breasts look and feel and if your breasts are changing. This allows you to notice changes early. It can also offer you some reassurance that your breast  health is good. One way to learn what is normal for your breasts and whether your breasts are changing is to do a breast self-exam. If you find a lump or something that was not present in the past, it is best to contact your caregiver right away. Other findings that should be evaluated by your caregiver include nipple discharge, especially if it is bloody; skin changes or reddening; areas where the skin seems to be pulled in (retracted); or new lumps and bumps. Breast pain is seldom associated with cancer (malignancy), but should also be evaluated by a caregiver. BREAST SELF-EXAM The best time to examine your breasts is 5 7 days after your menstrual period is over.  ExitCare Patient Information 2013 [employer]Care[employer].   Exercise to Stay Healthy Exercise helps you become and stay healthy. EXERCISE IDEAS AND TIPS Choose exercises that:  You enjoy.  Fit into your day. You do not need to exercise really hard to be healthy. You can do exercises at a slow or medium level and stay healthy. You can:  Stretch before and after working out.  Try yoga, Pilates, or tai chi.  Lift weights.  Walk fast, swim, jog, run, climb stairs, bicycle, dance, or rollerskate.  Take aerobic classes. Exercises that burn about 150 calories:  Running 1  miles in 15 minutes.  Playing volleyball for 45 to 60 minutes.  Washing and waxing a car for 45 to 60 minutes.  Playing touch football for 45 minutes.  Walking 1   miles in 35 minutes.  Pushing a stroller 1  miles in 30 minutes.  Playing basketball for 30 minutes.  Raking leaves for 30 minutes.  Bicycling 5 miles in 30 minutes.  Walking 2 miles in 30 minutes.  Dancing for 30 minutes.  Shoveling snow for 15 minutes.  Swimming laps for 20 minutes.  Walking up stairs for 15 minutes.  Bicycling 4 miles in 15 minutes.  Gardening for 30 to 45 minutes.  Jumping rope for 15 minutes.  Washing windows or floors for 45 to 60 minutes. Document Released: 02/05/2010 Document Revised: 03/28/2011 Document Reviewed: 02/05/2010 ExitCare Patient Information 2013 [employer]Care, [employer].   Other topics ( that may be useful information):    Sexually Transmitted Disease Sexually transmitted disease (STD) refers to any infection that is passed from person to person during sexual activity. This may happen by way of saliva, semen, blood, vaginal mucus, or urine. Common STDs include:  Gonorrhea.  Chlamydia.  Syphilis.  HIV/AIDS.  Genital herpes.  Hepatitis B and C.  Trichomonas.  Human papillomavirus (HPV).  Pubic lice. CAUSES  An STD may be spread by bacteria, virus, or parasite. A person can get an STD by:  Sexual intercourse with an infected person.  Sharing sex toys with an infected person.  Sharing needles with an infected person.  Having intimate contact with the genitals, mouth, or rectal areas of an infected person. SYMPTOMS  Some people may not have any symptoms, but they can still pass the infection to others. Different STDs have different symptoms. Symptoms include:  Painful or bloody urination.  Pain in the pelvis, abdomen, vagina, anus, throat, or eyes.  Skin rash, itching, irritation, growths, or sores (lesions). These usually occur in the genital or anal area.  Abnormal vaginal discharge.  Penile discharge in men.  Soft, flesh-colored skin growths in the genital or anal area.  Fever.  Pain or bleeding during  sexual intercourse.  Swollen glands in the groin area.  Yellow skin and eyes (jaundice).   This is seen with hepatitis. DIAGNOSIS  To make a diagnosis, your caregiver may:  Take a medical history.  Perform a physical exam.  Take a specimen (culture) to be examined.  Examine a sample of discharge under a microscope.  Perform blood test TREATMENT   Chlamydia, gonorrhea, trichomonas, and syphilis can be cured with antibiotic medicine.  Genital herpes, hepatitis, and HIV can be treated, but not cured, with prescribed medicines. The medicines will lessen the symptoms.  Genital warts from HPV can be treated with medicine or by freezing, burning (electrocautery), or surgery. Warts may come back.  HPV is a virus and cannot be cured with medicine or surgery.However, abnormal areas may be followed very closely by your caregiver and may be removed from the cervix, vagina, or vulva through office procedures or surgery. If your diagnosis is confirmed, your recent sexual partners need treatment. This is true even if they are symptom-free or have a negative culture or evaluation. They should not have sex until their caregiver says it is okay. HOME CARE INSTRUCTIONS  All sexual partners should be informed, tested, and treated for all STDs.  Take your antibiotics as directed. Finish them even if you start to feel better.  Only take over-the-counter or prescription medicines for pain, discomfort, or fever as directed by your caregiver.  Rest.  Eat a balanced diet and drink enough fluids to keep your urine clear or pale yellow.  Do not have sex until treatment is completed and you have followed up with your caregiver. STDs should be checked after treatment.  Keep all follow-up appointments, Pap tests, and blood tests as directed by your caregiver.  Only use latex condoms and water-soluble lubricants during sexual activity. Do not use petroleum jelly or oils.  Avoid alcohol and illegal  drugs.  Get vaccinated for HPV and hepatitis. If you have not received these vaccines in the past, talk to your caregiver about whether one or both might be right for you.  Avoid risky sex practices that can break the skin. The only way to avoid getting an STD is to avoid all sexual activity.Latex condoms and dental dams (for oral sex) will help lessen the risk of getting an STD, but will not completely eliminate the risk. SEEK MEDICAL CARE IF:   You have a fever.  You have any new or worsening symptoms. Document Released: 03/26/2002 Document Revised: 03/28/2011 Document Reviewed: 04/02/2010 [facility] Patient Information 2013 [place].    Domestic Abuse You are being battered or abused if someone close to you hits, pushes, or physically hurts you in any way. You also are being abused if you are forced into activities. You are being sexually abused if you are forced to have sexual contact of any kind. You are being emotionally abused if you are made to feel worthless or if you are constantly threatened. It is important to remember that help is available. No one has the right to abuse you. PREVENTION OF FURTHER ABUSE  Learn the warning signs of danger. This varies with situations but may include: the use of alcohol, threats, isolation from friends and family, or forced sexual contact. Leave if you feel that violence is going to occur.  If you are attacked or beaten, report it to the police so the abuse is documented. You do not have to press charges. The police can protect you while you or the attackers are leaving. Get the officer's name and badge number and a copy of the report.  Find someone you  can trust and tell them what is happening to you: your caregiver, a nurse, clergy member, close friend or family member. Feeling ashamed is natural, but remember that you have done nothing wrong. No one deserves abuse. Document Released: 01/01/2000 Document Revised: 03/28/2011 Document  Reviewed: 03/11/2010 [facility] Patient Information 2013 [place].    How Much is Too Much Alcohol? Drinking too much alcohol can cause injury, accidents, and health problems. These types of problems can include:   Car crashes.  Falls.  Family fighting (domestic violence).  Drowning.  Fights.  Injuries.  Burns.  Damage to certain organs.  Having a baby with birth defects. ONE DRINK CAN BE TOO MUCH WHEN YOU ARE:  Working.  Pregnant or breastfeeding.  Taking medicines. Ask your doctor.  Driving or planning to drive. If you or someone you know has a drinking problem, get help from a doctor.  Document Released: 10/30/2008 Document Revised: 03/28/2011 Document Reviewed: 10/30/2008 [facility] Patient Information 2013 [place].   Smoking Hazards Smoking cigarettes is extremely bad for your health. Tobacco smoke has over 200 known poisons in it. There are over 60 chemicals in tobacco smoke that cause cancer. Some of the chemicals found in cigarette smoke include:   Cyanide.  Benzene.  Formaldehyde.  Methanol (wood alcohol).  Acetylene (fuel used in welding torches).  Ammonia. Cigarette smoke also contains the poisonous gases nitrogen oxide and carbon monoxide.  Cigarette smokers have an increased risk of many serious medical problems and Smoking causes approximately:  90% of all lung cancer deaths in men.  80% of all lung cancer deaths in women.  90% of deaths from chronic obstructive lung disease. Compared with nonsmokers, smoking increases the risk of:  Coronary heart disease by 2 to 4 times.  Stroke by 2 to 4 times.  Men developing lung cancer by 23 times.  Women developing lung cancer by 13 times.  Dying from chronic obstructive lung diseases by 12 times.  . Smoking is the most preventable cause of death and disease in our society.  WHY IS SMOKING ADDICTIVE?  Nicotine is the chemical agent in tobacco that is capable of causing addiction  or dependence.  When you smoke and inhale, nicotine is absorbed rapidly into the bloodstream through your lungs. Nicotine absorbed through the lungs is capable of creating a powerful addiction. Both inhaled and non-inhaled nicotine may be addictive.  Addiction studies of cigarettes and spit tobacco show that addiction to nicotine occurs mainly during the teen years, when young people begin using tobacco products. WHAT ARE THE BENEFITS OF QUITTING?  There are many health benefits to quitting smoking.   Likelihood of developing cancer and heart disease decreases. Health improvements are seen almost immediately.  Blood pressure, pulse rate, and breathing patterns start returning to normal soon after quitting. QUITTING SMOKING   American Lung Association - 1-800-LUNGUSA  American Cancer Society - 1-800-ACS-2345 Document Released: 02/11/2004 Document Revised: 03/28/2011 Document Reviewed: 10/15/2008 [facility] Patient Information 2013 [place].   Stress Management Stress is a state of physical or mental tension that often results from changes in your life or normal routine. Some common causes of stress are:  Death of a loved one.  Injuries or severe illnesses.  Getting fired or changing jobs.  Moving into a new home. Other causes may be:  Sexual problems.  Business or financial losses.  Taking on a large debt.  Regular conflict with someone at home or at work.  Constant tiredness from lack of sleep. It is not  just bad things that are stressful. It may be stressful to:  Win the lottery.  Get married.  Buy a new car. The amount of stress that can be easily tolerated varies from person to person. Changes generally cause stress, regardless of the types of change. Too much stress can affect your health. It may lead to physical or emotional problems. Too little stress (boredom) may also become stressful. SUGGESTIONS TO REDUCE STRESS:  Talk things over with your family and  friends. It often is helpful to share your concerns and worries. If you feel your problem is serious, you may want to get help from a professional counselor.  Consider your problems one at a time instead of lumping them all together. Trying to take care of everything at once may seem impossible. List all the things you need to do and then start with the most important one. Set a goal to accomplish 2 or 3 things each day. If you expect to do too many in a single day you will naturally fail, causing you to feel even more stressed.  Do not use alcohol or drugs to relieve stress. Although you may feel better for a short time, they do not remove the problems that caused the stress. They can also be habit forming.  Exercise regularly - at least 3 times per week. Physical exercise can help to relieve that "uptight" feeling and will relax you.  The shortest distance between despair and hope is often a good night's sleep.  Go to bed and get up on time allowing yourself time for appointments without being rushed.  Take a short "time-out" period from any stressful situation that occurs during the day. Close your eyes and take some deep breaths. Starting with the muscles in your face, tense them, hold it for a few seconds, then relax. Repeat this with the muscles in your neck, shoulders, hand, stomach, back and legs.  Take good care of yourself. Eat a balanced diet and get plenty of rest.  Schedule time for having fun. Take a break from your daily routine to relax. HOME CARE INSTRUCTIONS   Call if you feel overwhelmed by your problems and feel you can no longer manage them on your own.  Return immediately if you feel like hurting yourself or someone else. Document Released: 06/29/2000 Document Revised: 03/28/2011 Document Reviewed: 02/19/2007 [facility] Patient Information 2013 [place].

## 2013-08-27 NOTE — Progress Notes (Signed)
28 y.o. G0P0 Single Caucasian Fe here for annual exam. Periods scant with Mirena IUD. Desires STD screening Gonorrhea/chlamydia only. Busy with work. Noted left breast lump about one month ago, saw PCP in her area and felt it was not anything to be concerned about and felt related to scant periods with IUD. Patient was to go back in one month, but preferred check here. She is still able to feel area and is concerned with her mothers history of breast cancer at age 77. Denies nipple discharge or skin change. Does drink caffeine frequently. No pain or tenderness noted. No other health issues today.  No LMP recorded. Patient is not currently having periods (Reason: IUD).          Sexually active: Yes.    The current method of family planning is IUD.    Exercising: Yes.    weights & cardio Smoker:  no  Health Maintenance: Pap: 08-17-11 neg MMG:  none Colonoscopy:  2014 BMD:   none TDaP:  2012 Labs: none Self breast exam: not done   reports that she has never smoked. She does not have any smokeless tobacco history on file. She reports that she drinks alcohol. She reports that she does not use illicit drugs.  Past Medical History  Diagnosis Date  . Ulcerative colitis   . Endometriosis   . GERD (gastroesophageal reflux disease)   . IC (interstitial cystitis)   . Kidney stone   . Hemorrhoid   . Abdominal pain     Past Surgical History  Procedure Laterality Date  . Bunionectomy  08, 10  . Tonsillectomy    . Hernia repair  25  . Dilation and curettage of uterus  2013    polyp  . Appendectomy  2013    with laparoscopy @ [facility]  . Pelvic laparoscopy  2009    times 2  . Colonoscopy  2014  . Intrauterine device insertion  06/24/2011    Mirena IUD    Current Outpatient Prescriptions  Medication Sig Dispense Refill  . buPROPion (WELLBUTRIN SR) 150 MG 12 hr tablet Take 150 mg by mouth daily.      Marland Kitchen levonorgestrel (MIRENA) 20 MCG/24HR IUD 1 each by Intrauterine route once.      .  pantoprazole (PROTONIX) 40 MG tablet Take 40 mg by mouth daily as needed.      . Probiotic Product (PROBIOTIC PO) Take by mouth daily.      Marland Kitchen topiramate (TOPAMAX) 25 MG tablet Take 25 mg by mouth daily.       No current facility-administered medications for this visit.    Family History  Problem Relation Age of Onset  . Breast cancer Mother 21  . Cancer Paternal Grandmother     colon  . Diabetes Paternal Grandmother     ROS:  Pertinent items are noted in HPI.  Otherwise, a comprehensive ROS was negative.  Exam:   BP 110/70  Pulse 72  Resp 16  Ht 5' 6.75" (1.695 m)  Wt 124 lb (56.246 kg)  BMI 19.58 kg/m2 Height: 5' 6.75" (169.5 cm)  Ht Readings from Last 3 Encounters:  08/27/13 5' 6.75" (1.695 m)  08/02/13 5\' 7"  (1.702 m)  07/22/13 5\' 7"  (1.702 m)    General appearance: alert, cooperative and appears stated age Head: Normocephalic, without obvious abnormality, atraumatic Neck: no adenopathy, supple, symmetrical, trachea midline and thyroid normal to inspection and palpation and non-palpable Lungs: clear to auscultation bilaterally Breasts: normal appearance, no masses or tenderness,  No nipple retraction or dimpling, No nipple discharge or bleeding, No axillary or supraclavicular adenopathy right breast. Left breast no skin change or nipple discharge, 1-2 cm cystic feel moveable mass at 12-1 o'clock at aerola edge, no axillary lymph node tenderness or enlargement. Heart: regular rate and rhythm Abdomen: soft, non-tender; no masses,  no organomegaly Extremities: extremities normal, atraumatic, no cyanosis or edema Skin: Skin color, texture, turgor normal. No rashes or lesions Lymph nodes: Cervical, supraclavicular, and axillary nodes normal. No abnormal inguinal nodes palpated Neurologic: Grossly normal   Pelvic: External genitalia:  no lesions              Urethra:  normal appearing urethra with no masses, tenderness or lesions              Bartholin's and Skene's: normal                  Vagina: normal appearing vagina with normal color and discharge, no lesions, removed piece of retained tampon              Cervix: non tender, normal, IUD string noted              Pap taken: Yes.   Bimanual Exam:  Uterus:  normal size, contour, position, consistency, mobility, non-tender and anteverted              Adnexa: normal adnexa and no mass, fullness, tenderness               Rectovaginal: Confirms               Anus:  Norma appearance     A. Well Woman with normal exam  Contraception Mirena IUD  Left breast cystic feel mass  Family History of breast cancer  STD screening  P:   Reviewed health and wellness pertinent to exam  Aware of waring signs with IUD  Discussed [place] evaluation with possible diagnostic mammogram if indicated. Patient agreeable. Patient will be contacted with information and date of appointment.  Pap smear taken today with HPV reflex  Lab GC,Chalmydia only   counseled on breast self exam, adequate intake of calcium and vitamin D, diet and exercise  return annually or prn  An After Visit Summary was printed and given to the patient.

## 2013-08-28 ENCOUNTER — Telehealth: Payer: Self-pay

## 2013-08-28 NOTE — Telephone Encounter (Signed)
Order to your desk for review and sign to fax to [place] so that we can schedule.

## 2013-08-28 NOTE — Telephone Encounter (Signed)
Message copied by Jasmine Awe on Wed Aug 28, 2013 10:51 AM ------      Message from: Regina Eck      Created: Tue Aug 27, 2013  5:28 PM       Please schedule above patient for left breast US and possible diagnostic mammogram and inform of time and place.      [place] please. Mother had breast cancer      Debbi ------

## 2013-08-28 NOTE — Progress Notes (Signed)
Reviewed personally.  M. Suzanne Mayzee Reichenbach, MD.  

## 2013-08-29 LAB — IPS N GONORRHOEA AND CHLAMYDIA BY PCR

## 2013-08-29 NOTE — Telephone Encounter (Signed)
Hilda from [facility] calling with pt's appointment. It is scheduled for 10:30 on Tuesday but if that is not good she can put her back in for today.

## 2013-08-29 NOTE — Telephone Encounter (Signed)
Spoke with Lenell Antu at [facility] who states their earliest available is next Friday at 7:45am. Asked if we could get patient seen sooner. Patient given work in appointment today at 10:30am. Spoke with patient to advised of this. Patient states she has an appointment for 8/18 at 10:30am. Called back to [facility] to verify as this was not mentioned upon scheduling for today. Advised patient does have appointment for 8/18 at 10:30am. Spoke with patient again patient would like to keep appointment for 8/18 at 10:30am.   Routing to provider for final review. Patient agreeable to disposition. Will close encounter

## 2013-08-30 LAB — IPS PAP TEST WITH HPV

## 2013-09-11 ENCOUNTER — Telehealth: Payer: Self-pay | Admitting: Emergency Medicine

## 2013-09-11 NOTE — Telephone Encounter (Signed)
Discussed results with patient from handwritten note from [facility] on [facility] Mammography reports from Diagnostic Mammogram and Ultrasound completed.  Advised breast imaging benign, patient aware.  Advised to continue monthly self breast exams. If notes any changes, needs office visit for evaluation.  Start screening mammograms in 5 years. Recommended 3D mammograms as breast tissue is category D, extremely dense.  Patient agreeable to instructions and verbalized understanding.  Routing to provider for final review. Patient agreeable to disposition. Will close encounter

## 2014-01-01 ENCOUNTER — Encounter: Payer: Self-pay | Admitting: Certified Nurse Midwife

## 2014-05-11 NOTE — Op Note (Signed)
PATIENT NAME:  Bethany Harrison, Bethany Harrison MR#:  [id number] DATE OF BIRTH:  [birth year]  DATE OF PROCEDURE:  03/10/2011  PREOPERATIVE DIAGNOSES: 1. Abnormal uterine bleeding. 2. Possible endometrial polyp.  POSTOPERATIVE DIAGNOSES:  1. Abnormal uterine bleeding. 2. Possible endometrial polyp.  OPERATION PERFORMED: Dilation and curettage, hysteroscopy.  SURGEON: Wonda Cheng. Rosenow, MD  OPERATIVE FINDINGS: Uterus sounded to 7 cm and dilated with ease. Visualization of the uterine cavity was somewhat limited secondary to bleeding; however, both tubal ostia were visualized. A possible endometrial polyp was visualized; however, with exploration using the uterine polyp forceps, I was unable to retrieve this.  DESCRIPTION OF PROCEDURE: After adequate general anesthesia, the patient was prepped and draped in routine fashion. The cervix was grasped with a Jacob's tenaculum and sounded to 7 cm. The cervix was dilated with ease. The uterine cavity was systematically visualized. Both tubal ostia were visible. Visualization was less than ideal secondary to bleeding. A possible endometrial polyp was visualized; however, I was unable to find an endometrial polyp with polyp forceps. Uterine curettage was performed with return of a small amount of tissue. The patient tolerated the procedure well and left the Operating Room in good condition. Sponge and needle counts were said to be correct at the end of the procedure. ____________________________ Wonda Cheng. Laurey Morale, MD pjr:slb D: 03/10/2011 13:03:58 ET      T: 03/10/2011 13:09:42 ET        JOB#: [id number] cc: Wonda Cheng. Laurey Morale, MD, <Dictator> Rosina Lowenstein MD ELECTRONICALLY SIGNED 03/23/2011 7:24

## 2014-06-26 ENCOUNTER — Encounter: Payer: Self-pay | Admitting: Obstetrics and Gynecology

## 2014-06-26 ENCOUNTER — Encounter
Admission: RE | Admit: 2014-06-26 | Discharge: 2014-06-26 | Disposition: A | Discharge status: Home / Self Care | Payer: BLUE CROSS/BLUE SHIELD | Source: Ambulatory Visit | Attending: Obstetrics and Gynecology | Admitting: Obstetrics and Gynecology

## 2014-06-26 ENCOUNTER — Ambulatory Visit (INDEPENDENT_AMBULATORY_CARE_PROVIDER_SITE_OTHER): Payer: BLUE CROSS/BLUE SHIELD | Admitting: Obstetrics and Gynecology

## 2014-06-26 ENCOUNTER — Other Ambulatory Visit: Payer: Self-pay

## 2014-06-26 VITALS — BP 102/72 | HR 59 | Ht 60.7 in | Wt 130.3 lb

## 2014-06-26 DIAGNOSIS — Z975 Presence of (intrauterine) contraceptive device: Secondary | ICD-10-CM | POA: Insufficient documentation

## 2014-06-26 DIAGNOSIS — K219 Gastro-esophageal reflux disease without esophagitis: Secondary | ICD-10-CM | POA: Diagnosis not present

## 2014-06-26 DIAGNOSIS — Z01818 Encounter for other preprocedural examination: Secondary | ICD-10-CM

## 2014-06-26 DIAGNOSIS — N809 Endometriosis, unspecified: Secondary | ICD-10-CM | POA: Diagnosis not present

## 2014-06-26 DIAGNOSIS — N200 Calculus of kidney: Secondary | ICD-10-CM | POA: Diagnosis not present

## 2014-06-26 DIAGNOSIS — K649 Unspecified hemorrhoids: Secondary | ICD-10-CM | POA: Diagnosis not present

## 2014-06-26 DIAGNOSIS — K5190 Ulcerative colitis, unspecified, without complications: Secondary | ICD-10-CM | POA: Insufficient documentation

## 2014-06-26 DIAGNOSIS — N3010 Interstitial cystitis (chronic) without hematuria: Secondary | ICD-10-CM | POA: Insufficient documentation

## 2014-06-26 DIAGNOSIS — Z01812 Encounter for preprocedural laboratory examination: Secondary | ICD-10-CM | POA: Insufficient documentation

## 2014-06-26 DIAGNOSIS — N800 Endometriosis of uterus: Secondary | ICD-10-CM | POA: Diagnosis not present

## 2014-06-26 HISTORY — DX: Headache: R51

## 2014-06-26 HISTORY — DX: Headache, unspecified: R51.9

## 2014-06-26 LAB — CBC WITH DIFFERENTIAL/PLATELET
BASOS ABS: 0 10*3/uL (ref 0–0.1)
BASOS PCT: 1 %
Eosinophils Absolute: 0 10*3/uL (ref 0–0.7)
Eosinophils Relative: 1 %
HEMATOCRIT: 39.2 % (ref 35.0–47.0)
HEMOGLOBIN: 13.3 g/dL (ref 12.0–16.0)
Lymphocytes Relative: 44 %
Lymphs Abs: 2.7 10*3/uL (ref 1.0–3.6)
MCH: 33.7 pg (ref 26.0–34.0)
MCHC: 34 g/dL (ref 32.0–36.0)
MCV: 99.1 fL (ref 80.0–100.0)
Monocytes Absolute: 0.3 10*3/uL (ref 0.2–0.9)
Monocytes Relative: 5 %
NEUTROS PCT: 49 %
Neutro Abs: 3.1 10*3/uL (ref 1.4–6.5)
PLATELETS: 282 10*3/uL (ref 150–440)
RBC: 3.96 MIL/uL (ref 3.80–5.20)
RDW: 12 % (ref 11.5–14.5)
WBC: 6.3 10*3/uL (ref 3.6–11.0)

## 2014-06-26 LAB — RAPID HIV SCREEN (HIV 1/2 AB+AG)
HIV 1/2 Antibodies: NONREACTIVE
HIV-1 P24 Antigen - HIV24: NONREACTIVE

## 2014-06-26 NOTE — Progress Notes (Signed)
Subjective:  PRE-OP H and P DOS: 06/30/2014   Patient is a 29 y.o. G0P62fmale scheduled for Operative laparoscopy with excision and fulguration of endometriosis, chromopertubation of fallopian tubes, and Mirena IUD removal.  The patient has been using Mirena IUD for contraception, cycle regulation, and dysmenorrhea control; she is in a monogamous relationship. She has a long history of symptomatic endometriosis in the form of chronic pelvic pain and unstable bladder symptoms.  She was initially diagnosed with endometriosis in 2009 through laparoscopy with peritoneal biopsies.  I performed the surgery at that time which demonstrated endometriosis in the cul-de-sac, left uterosacral ligament, and bladder.  She has since completed a six-month course of Lupron therapy shortly after diagnosis, she has been treated with multiple agents including Depo-Provera, various OCP regimens, which seems to be ineffective, and currently the Mirena IUD.  The patient has undergone hysteroscopy/D&C in the past without definitive pathology being identified; this did not regulate her heavy cycles. [facility] and had repeat laparoscopy done at that time; the op note from that surgery confirmed endometriosis on peritoneal biopsy with the endometriosis being seen in the right uterosacral ligament and on the bowel serosa; appendix was removed and demonstrated no pathology.  [facility].  Further discussion with the patient shows that she is interested in possibly attempting conception to have children.  Prior to definitive surgical management.  She will be getting engaged in the next several months with the intention of getting pregnant shortly thereafter.  The patient does have significant unstable bladder symptoms in the form of dysuria, intermittent frequency, and  worsening of symptoms just prior to menses.  She does not have nocturia or urgency.  She does not have chronic UTI's.  The patient does note that bowel movements tend to be worse just prior to entering menses.  She does have pre-menstrual tenesmus and an increased frequency of stools without evidence of blood per rectum or melena.  The patient's dysmenorrhea is sent for cramping with low back pain with radiation into the buttocks and left leg.  She also is experiencing deep thrusting dyspareunia.  These symptoms do coincide with the identification of endometriosis on laparoscopy.  Biopsies back in 2009.  Because the patient is still having significant symptoms of pain from endometriosis, and because she is interested in childbearing, she is opting to proceed with this third of laparoscopy to assess persistence of disease, treatment of disease, and evaluation of the fallopian tubes through chromopertubation.  She desires to attempt conception.  Shortly after proceeding with laparoscopy.   Pertinent Gynecological History: See HPI   Menstrual History: OB History    Gravida Para Term Preterm AB TAB SAB Ectopic Multiple Living   0               Menarche age: not stated Patient's last menstrual period was 05/28/2014 (exact date).    Past Medical History  Diagnosis Date  . Ulcerative colitis   . Endometriosis   . GERD (gastroesophageal reflux disease)   . IC (interstitial cystitis)   . Kidney stone   . Hemorrhoid   . Abdominal pain     Past Surgical History  Procedure Laterality Date  . Bunionectomy  08, 10  . Tonsillectomy    . Hernia repair  845 . Dilation and curettage of uterus  2013    polyp  .  Appendectomy  2013    with laparoscopy @ [facility]  . Pelvic laparoscopy  2009    times 2  . Colonoscopy  2014  . Intrauterine device insertion  06/24/2011    Mirena IUD    OB History  Gravida Para Term Preterm AB SAB TAB Ectopic Multiple Living  0                 History    Social History  . Marital Status: Single    Spouse Name: N/A  . Number of Children: N/A  . Years of Education: N/A   Occupational History  . [employer]   Social History Main Topics  . Smoking status: Never Smoker   . Smokeless tobacco: Never Used  . Alcohol Use: 0.0 oz/week    0 Standard drinks or equivalent per week     Comment: 3 a month  . Drug Use: No  . Sexual Activity:    Partners: Male    Birth Control/ Protection: IUD   Other Topics Concern  . None   Social History Narrative    Family History  Problem Relation Age of Onset  . Breast cancer Mother 21  . Cancer Paternal Grandmother     colon  . Diabetes Paternal Grandmother      (Not in a hospital admission)  Allergies  Allergen Reactions  . Sulfa Antibiotics Nausea Only    Review of Systems Constitutional: No recent fever/chills/sweats Respiratory: No recent cough/bronchitis Cardiovascular: No chest pain Gastrointestinal: No recent nausea/vomiting/diarrhea Genitourinary: No UTI symptoms Hematologic/lymphatic:No history of coagulopathy or recent blood thinner use    Objective:    BP 102/72 mmHg  Pulse 59  Ht 5' 0.7" (1.542 m)  Wt 130 lb 5 oz (59.109 kg)  BMI 24.86 kg/m2  LMP 05/28/2014 (Exact Date)  General:   Normal  Skin:   normal  HEENT:  Normal  Neck:  Supple without Adenopathy or Thyromegaly  Lungs:   Heart:              Breasts:   Abdomen:  Pelvis:  M/S   Extremeties:  Neuro:    clear to auscultation bilaterally   Normal without murmur   Not Examined   soft, non-tender; bowel sounds normal; no masses,  no organomegaly   Exam deferred to OR  No CVAT  Warm/Dry   Normal          Assessment:   1.  Symptomatic endometriosis   Plan:  1.  Laparoscopy with excision and fulguration of endometriosis. 2.  Chromopertubation of the fallopian tubes. 3.  Removal of Mirena IUD  Preoperative counseling: The patient is understanding of the planned procedures and is  aware of and is accepting of all surgical risks which include but are not limited to bleeding, infection, pelvic organ injury, need for repair, blood clots disorders, anesthesia risks, etc.  All questions have been answered.  Informed consent is given.  Patient is ready and willing to proceed with surgery as scheduled.  [name], MD

## 2014-06-26 NOTE — Patient Instructions (Signed)
  Your procedure is scheduled W[place]:SHUO 13, 2016 (M0nday) Report to Same Day Surgery. To find out your arrival time please call [phone] between 1PM - 3PM on June 27, 2014 (Friday).  Remember: Instructions that are not followed completely may result in serious medical risk, up to and including death, or upon the discretion of your surgeon and anesthesiologist your surgery may need to be rescheduled.    __x__ 1. Do not eat food or drink liquids after midnight. No gum chewing or hard candies.     __x_ 2. No Alcohol for 24 hours before or after surgery.   ____ 3. Bring all medications with you on the day of surgery if instructed.    __x__ 4. Notify your doctor if there is any change in your medical condition     (cold, fever, infections).     Do not wear jewelry, make-up, hairpins, clips or nail polish.  Do not wear lotions, powders, or perfumes. You may wear deodorant.  Do not shave 48 hours prior to surgery. Men may shave face and neck.  Do not bring valuables to the hospital.    [facility] is not responsible for any belongings or valuables.               Contacts, dentures or bridgework may not be worn into surgery.  Leave your suitcase in the car. After surgery it may be brought to your room.  For patients admitted to the hospital, discharge time is determined by your                treatment team.   Patients discharged the day of surgery will not be allowed to drive home.   Please read over the following fact sheets that you were given:   Surgical Site Infection Prevention   ____ Take these medicines the morning of surgery with A SIP OF WATER:    1. Protonix  2.   3.   4.  5.  6.  ____ Fleet Enema (as directed)   __x__ Use CHG Soap as directed  ____ Use inhalers on the day of surgery  ____ Stop metformin 2 days prior to surgery    ____ Take 1/2 of usual insulin dose the night before surgery and none on the morning of surgery.   ____ Stop Coumadin/Plavix/aspirin  on   ____ Stop Anti-inflammatories on    ____ Stop supplements until after surgery.    ____ Bring C-Pap to the hospital.

## 2014-06-27 LAB — RPR: RPR Ser Ql: NONREACTIVE

## 2014-06-30 ENCOUNTER — Encounter: Payer: Self-pay | Admitting: *Deleted

## 2014-06-30 ENCOUNTER — Ambulatory Visit
Admission: RE | Admit: 2014-06-30 | Discharge: 2014-06-30 | Disposition: A | Discharge status: Home / Self Care | Payer: BLUE CROSS/BLUE SHIELD | Source: Ambulatory Visit | Attending: Obstetrics and Gynecology | Admitting: Obstetrics and Gynecology

## 2014-06-30 ENCOUNTER — Ambulatory Visit: Payer: BLUE CROSS/BLUE SHIELD | Admitting: Anesthesiology

## 2014-06-30 ENCOUNTER — Encounter
Admission: RE | Disposition: A | Discharge status: Home / Self Care | Payer: Self-pay | Source: Ambulatory Visit | Attending: Obstetrics and Gynecology

## 2014-06-30 DIAGNOSIS — N946 Dysmenorrhea, unspecified: Secondary | ICD-10-CM | POA: Insufficient documentation

## 2014-06-30 DIAGNOSIS — R350 Frequency of micturition: Secondary | ICD-10-CM | POA: Insufficient documentation

## 2014-06-30 DIAGNOSIS — N803 Endometriosis of pelvic peritoneum: Secondary | ICD-10-CM | POA: Insufficient documentation

## 2014-06-30 DIAGNOSIS — N809 Endometriosis, unspecified: Secondary | ICD-10-CM

## 2014-06-30 DIAGNOSIS — K5190 Ulcerative colitis, unspecified, without complications: Secondary | ICD-10-CM | POA: Insufficient documentation

## 2014-06-30 DIAGNOSIS — K219 Gastro-esophageal reflux disease without esophagitis: Secondary | ICD-10-CM | POA: Diagnosis not present

## 2014-06-30 DIAGNOSIS — Z800 Family history of malignant neoplasm of digestive organs: Secondary | ICD-10-CM | POA: Diagnosis not present

## 2014-06-30 DIAGNOSIS — Z882 Allergy status to sulfonamides status: Secondary | ICD-10-CM | POA: Diagnosis not present

## 2014-06-30 DIAGNOSIS — Z803 Family history of malignant neoplasm of breast: Secondary | ICD-10-CM | POA: Diagnosis not present

## 2014-06-30 DIAGNOSIS — N941 Dyspareunia: Secondary | ICD-10-CM | POA: Insufficient documentation

## 2014-06-30 DIAGNOSIS — R300 Dysuria: Secondary | ICD-10-CM | POA: Insufficient documentation

## 2014-06-30 DIAGNOSIS — Z87442 Personal history of urinary calculi: Secondary | ICD-10-CM | POA: Insufficient documentation

## 2014-06-30 DIAGNOSIS — Z833 Family history of diabetes mellitus: Secondary | ICD-10-CM | POA: Diagnosis not present

## 2014-06-30 HISTORY — PX: IUD REMOVAL: SHX5392

## 2014-06-30 HISTORY — PX: LAPAROSCOPY: SHX197

## 2014-06-30 HISTORY — PX: CHROMOPERTUBATION: SHX6288

## 2014-06-30 SURGERY — CHROMOPERTUBATION, FALLOPIAN TUBE
Anesthesia: General

## 2014-06-30 MED ORDER — FENTANYL CITRATE (PF) 100 MCG/2ML IJ SOLN
INTRAMUSCULAR | Status: DC | PRN
  Administered 2014-06-30: 50 ug via INTRAVENOUS
  Administered 2014-06-30: 100 ug via INTRAVENOUS
  Administered 2014-06-30: 50 ug via INTRAVENOUS

## 2014-06-30 MED ORDER — ROCURONIUM BROMIDE 100 MG/10ML IV SOLN
INTRAVENOUS | Status: DC | PRN
  Administered 2014-06-30: 40 mg via INTRAVENOUS

## 2014-06-30 MED ORDER — KETOROLAC TROMETHAMINE 30 MG/ML IJ SOLN
INTRAMUSCULAR | Status: DC | PRN
  Administered 2014-06-30: 30 mg via INTRAVENOUS

## 2014-06-30 MED ORDER — DEXAMETHASONE SODIUM PHOSPHATE 4 MG/ML IJ SOLN
INTRAMUSCULAR | Status: DC | PRN
  Administered 2014-06-30: 10 mg via INTRAVENOUS

## 2014-06-30 MED ORDER — FENTANYL CITRATE (PF) 100 MCG/2ML IJ SOLN
25.0000 ug | INTRAMUSCULAR | Status: AC | PRN
  Administered 2014-06-30 (×6): 25 ug via INTRAVENOUS

## 2014-06-30 MED ORDER — METHYLENE BLUE 1 % INJ SOLN
INTRAMUSCULAR | Status: AC
  Filled 2014-06-30: qty 10

## 2014-06-30 MED ORDER — BUPIVACAINE-EPINEPHRINE (PF) 0.5% -1:200000 IJ SOLN
INTRAMUSCULAR | Status: DC | PRN
  Administered 2014-06-30: 9 mL

## 2014-06-30 MED ORDER — FENTANYL CITRATE (PF) 100 MCG/2ML IJ SOLN
INTRAMUSCULAR | Status: AC
  Administered 2014-06-30: 25 ug via INTRAVENOUS
  Filled 2014-06-30: qty 2

## 2014-06-30 MED ORDER — PROPOFOL 10 MG/ML IV BOLUS
INTRAVENOUS | Status: DC | PRN
  Administered 2014-06-30: 30 mg via INTRAVENOUS
  Administered 2014-06-30: 150 mg via INTRAVENOUS

## 2014-06-30 MED ORDER — IBUPROFEN 800 MG PO TABS: 800.0000 mg | ORAL_TABLET | Freq: Three times a day (TID) | ORAL | Status: DC

## 2014-06-30 MED ORDER — LACTATED RINGERS IV SOLN
INTRAVENOUS | Status: DC | PRN
  Administered 2014-06-30: 10:00:00 via INTRAVENOUS

## 2014-06-30 MED ORDER — OXYCODONE-ACETAMINOPHEN 5-325 MG PO TABS: 1.0000 | ORAL_TABLET | ORAL | Status: DC | PRN

## 2014-06-30 MED ORDER — EPHEDRINE SULFATE 50 MG/ML IJ SOLN
INTRAMUSCULAR | Status: DC | PRN
  Administered 2014-06-30: 10 mg via INTRAVENOUS

## 2014-06-30 MED ORDER — ONDANSETRON HCL 4 MG/2ML IJ SOLN
INTRAMUSCULAR | Status: DC | PRN
  Administered 2014-06-30: 4 mg via INTRAVENOUS

## 2014-06-30 MED ORDER — MIDAZOLAM HCL 2 MG/2ML IJ SOLN
INTRAMUSCULAR | Status: DC | PRN
  Administered 2014-06-30: 2 mg via INTRAVENOUS

## 2014-06-30 MED ORDER — SUGAMMADEX SODIUM 200 MG/2ML IV SOLN
INTRAVENOUS | Status: DC | PRN
  Administered 2014-06-30: 120 mg via INTRAVENOUS

## 2014-06-30 MED ORDER — OXYCODONE-ACETAMINOPHEN 5-325 MG PO TABS
ORAL_TABLET | ORAL | Status: AC
  Administered 2014-06-30: 1
  Filled 2014-06-30: qty 1

## 2014-06-30 MED ORDER — BUPIVACAINE-EPINEPHRINE (PF) 0.5% -1:200000 IJ SOLN
INTRAMUSCULAR | Status: AC
  Filled 2014-06-30: qty 30

## 2014-06-30 MED ORDER — SCOPOLAMINE 1 MG/3DAYS TD PT72
MEDICATED_PATCH | TRANSDERMAL | Status: AC
  Filled 2014-06-30: qty 1

## 2014-06-30 MED ORDER — HYDROMORPHONE HCL 1 MG/ML IJ SOLN: 0.2500 mg | INTRAMUSCULAR | Status: DC | PRN

## 2014-06-30 MED ORDER — SODIUM CHLORIDE 0.9 % IJ SOLN
INTRAMUSCULAR | Status: AC
  Filled 2014-06-30: qty 50

## 2014-06-30 MED ORDER — ONDANSETRON HCL 4 MG/2ML IJ SOLN: 4.0000 mg | Freq: Once | INTRAMUSCULAR | Status: DC | PRN

## 2014-06-30 SURGICAL SUPPLY — 33 items
BLADE SURG SZ11 CARB STEEL (BLADE) ×3 IMPLANT
BNDG ADH 2 X3.75 FABRIC TAN LF (GAUZE/BANDAGES/DRESSINGS) IMPLANT
CANISTER SUCT 1200ML W/VALVE (MISCELLANEOUS) ×3 IMPLANT
CATH ROBINSON RED A/P 16FR (CATHETERS) ×3 IMPLANT
CHLORAPREP W/TINT 26ML (MISCELLANEOUS) ×3 IMPLANT
GLOVE BIO SURGEON STRL SZ8 (GLOVE) ×6 IMPLANT
GLOVE INDICATOR 8.0 STRL GRN (GLOVE) ×6 IMPLANT
GOWN STRL REUS W/ TWL LRG LVL3 (GOWN DISPOSABLE) ×1 IMPLANT
GOWN STRL REUS W/ TWL XL LVL3 (GOWN DISPOSABLE) ×1 IMPLANT
GOWN STRL REUS W/TWL LRG LVL3 (GOWN DISPOSABLE) ×2
GOWN STRL REUS W/TWL XL LVL3 (GOWN DISPOSABLE) ×2
IRRIGATION STRYKERFLOW (MISCELLANEOUS) ×1 IMPLANT
IRRIGATOR STRYKERFLOW (MISCELLANEOUS) ×3
IV LACTATED RINGERS 1000ML (IV SOLUTION) ×3 IMPLANT
KIT RM TURNOVER CYSTO AR (KITS) ×3 IMPLANT
LABEL OR SOLS (LABEL) IMPLANT
NS IRRIG 1000ML POUR BTL (IV SOLUTION) IMPLANT
NS IRRIG 500ML POUR BTL (IV SOLUTION) ×3 IMPLANT
PACK GYN LAPAROSCOPIC (MISCELLANEOUS) ×3 IMPLANT
PAD OB MATERNITY 4.3X12.25 (PERSONAL CARE ITEMS) ×3 IMPLANT
PAD PREP 24X41 OB/GYN DISP (PERSONAL CARE ITEMS) ×3 IMPLANT
POUCH ENDO CATCH 10MM SPEC (MISCELLANEOUS) IMPLANT
SCISSORS METZENBAUM CVD 33 (INSTRUMENTS) IMPLANT
SLEEVE ENDOPATH XCEL 5M (ENDOMECHANICALS) ×3 IMPLANT
SUT PLAIN 4 0 FS 2 27 (SUTURE) IMPLANT
SUT VIC AB 0 CT2 27 (SUTURE) IMPLANT
SUT VIC AB 0 UR5 27 (SUTURE) IMPLANT
SUT VIC AB 4-0 SH 27 (SUTURE) ×2
SUT VIC AB 4-0 SH 27XANBCTRL (SUTURE) ×1 IMPLANT
SYR 30ML LL (SYRINGE) ×3 IMPLANT
TROCAR XCEL NON-BLD 5MMX100MML (ENDOMECHANICALS) ×3 IMPLANT
TUBING ART PRESS 48 MALE/FEM (TUBING) ×3 IMPLANT
TUBING INSUFFLATOR HI FLOW (MISCELLANEOUS) ×3 IMPLANT

## 2014-06-30 NOTE — Progress Notes (Signed)
Date of Initial H&P: 06/26/2014  History reviewed, patient examined, no change in status, stable for surgery.

## 2014-06-30 NOTE — Anesthesia Procedure Notes (Signed)
Procedure Name: Intubation Date/Time: 06/30/2014 9:50 AM Performed by: Jonna Clark Pre-anesthesia Checklist: Emergency Drugs available, Patient identified, Suction available, Patient being monitored and Timeout performed Patient Re-evaluated:Patient Re-evaluated prior to inductionOxygen Delivery Method: Circle system utilized Preoxygenation: Pre-oxygenation with 100% oxygen Intubation Type: IV induction Ventilation: Mask ventilation without difficulty Laryngoscope Size: Miller and 2 Grade View: Grade I Tube type: Oral Tube size: 7.0 mm Number of attempts: 1 Placement Confirmation: ETT inserted through vocal cords under direct vision,  positive ETCO2 and breath sounds checked- equal and bilateral Secured at: 22 cm Tube secured with: Tape Dental Injury: Teeth and Oropharynx as per pre-operative assessment

## 2014-06-30 NOTE — Discharge Instructions (Signed)

## 2014-06-30 NOTE — Anesthesia Preprocedure Evaluation (Signed)
Anesthesia Evaluation  Patient identified by MRN, date of birth, ID band Patient awake    Reviewed: Allergy & Precautions, NPO status , Patient's Chart, lab work & pertinent test results  History of Anesthesia Complications (+) PONV  Airway Mallampati: I  TM Distance: >3 FB Neck ROM: Full    Dental no notable dental hx.    Pulmonary neg pulmonary ROS,  breath sounds clear to auscultation  Pulmonary exam normal       Cardiovascular Exercise Tolerance: Good negative cardio ROS Normal cardiovascular examRhythm:Regular Rate:Normal     Neuro/Psych  Headaches, negative psych ROS   GI/Hepatic Neg liver ROS, PUD, GERD-  Medicated and Controlled,  Endo/Other  negative endocrine ROS  Renal/GU negative Renal ROS  negative genitourinary   Musculoskeletal negative musculoskeletal ROS (+)   Abdominal   Peds negative pediatric ROS (+)  Hematology negative hematology ROS (+)   Anesthesia Other Findings   Reproductive/Obstetrics negative OB ROS                             Anesthesia Physical Anesthesia Plan  ASA: II  Anesthesia Plan: General   Post-op Pain Management:    Induction: Intravenous  Airway Management Planned: Oral ETT  Additional Equipment:   Intra-op Plan:   Post-operative Plan: Extubation in OR  Informed Consent: I have reviewed the patients History and Physical, chart, labs and discussed the procedure including the risks, benefits and alternatives for the proposed anesthesia with the patient or authorized representative who has indicated his/her understanding and acceptance.   Dental advisory given  Plan Discussed with: CRNA and Surgeon  Anesthesia Plan Comments:         Anesthesia Quick Evaluation

## 2014-06-30 NOTE — Op Note (Signed)
Bethany Harrison PROCEDURE DATE: 06/30/2014 10:44 AM  PREOPERATIVE DIAGNOSIS: endometriosis POSTOPERATIVE DIAGNOSIS: same as pre op PROCEDURE: Procedure(s): CHROMOPERTUBATION (N/A)  with excision of endoemtriosis and biopsy (N/A) INTRAUTERINE DEVICE (IUD) REMOVAL (N/A) SURGEON:  Dr. Hassell Done A Mattheus Rauls ASSISTANT: none ANESTHESIA: General Endotracheal  INDICATIONS: 29 y.o. G0P0 with history of chronic pelvic pain concerning for endometriosis desiring surgical evaluation.   Please see preoperative notes for further details.   FINDINGS:  Small uterus, normal ovaries and fallopian tubes bilaterally.  There is diffuse evidence of red-colored endometriosis lesions and scarring in the posterior cul-de-sac; and thin adhesions which were lysed.  Peritoneal biopsies were taken and sent to pathology. No other abdominal/pelvic abnormality.  Normal upper abdomen. Fallopian tubes were patent bilaterally on chromopertubation.   I/O's: Total I/O In: -  Out: 50 [Urine:50] SPECIMENS: Peritoneal biopsies-cul-de-sac COMPLICATIONS: None immediate COUNTS:  YES  PROCEDURE IN DETAIL: The patient was brought to the operating room where she was placed in the supine position.  General endotracheal anesthesia was induced without difficulty.  She was placed in the dorsal lithotomy position using bumblebee stirrups.  A Chlorpro and Betadine, abdominal, perineal, intravaginal prep and drape was performed in standard fashion.  The timeout was completed.  The red Robison catheter was used to drain the bladder of clear urine.  A weighted speculum was placed into the vagina and a Cohen's cannula was placed onto the cervix to facilitate uterine manipulation and chromopertubation. Connection tubing was attached to the end of the cannula along with a 30 mL syringe filled with methylene blue dye diluted in normal saline.. Laparoscopy was performed in standard fashion.  The Opti view 5 mm trocar and sleeve were placed through a 5  mm subumbilical incision directly into the abdominal pelvic cavity, without evidence of bowel or vascular injury. One additional 5 mm port was placed in the lower abdomen under direct visualization.  The above noted findings were photo documented. Cul-de-sac lesions were excised using biopsy forceps. Additional adhesions between the bowel and right lower quadrant where also lysed. Minimal bleeding was encountered. Chromopertubation was then performed with evidence of bilateral spill from the fallopian tubes. The pelvis was then copiously irrigated with saline solution and the irrigant fluid was aspirated. Upon completion of the procedures and inspection for hemostasis, the surgery was complete with all instrumentation being removed from the abdominal pelvic cavity.  The pneumoperitoneum was released.  The incisions were closed sutures. Tegaderm dressing applied.  The patient was awakened, extubated, and taken to the recovery room in satisfactory condition.  [name], MD [facility]

## 2014-06-30 NOTE — Transfer of Care (Signed)
Immediate Anesthesia Transfer of Care Note  Patient: Bethany Harrison  Procedure(s) Performed: Procedure(s): CHROMOPERTUBATION (N/A)  with excision of endoemtriosis and biopsy (N/A) INTRAUTERINE DEVICE (IUD) REMOVAL (N/A)  Patient Location: PACU  Anesthesia Type:General  Level of Consciousness: awake, alert  and oriented  Airway & Oxygen Therapy: Patient Spontanous Breathing and Patient connected to face mask oxygen  Post-op Assessment: Report given to RN and Post -op Vital signs reviewed and stable  Post vital signs: Reviewed and stable  Last Vitals:  Filed Vitals:   06/30/14 1054  BP: 113/67  Pulse: 103  Temp: 36.5 C  Resp: 20    Complications: No apparent anesthesia complications

## 2014-06-30 NOTE — Anesthesia Postprocedure Evaluation (Signed)
  Anesthesia Post-op Note  Patient: Bethany Harrison  Procedure(s) Performed: Procedure(s): CHROMOPERTUBATION (N/A)  with excision of endoemtriosis and biopsy (N/A) INTRAUTERINE DEVICE (IUD) REMOVAL (N/A)  Anesthesia type:General  Patient location: PACU  Post pain: Pain level controlled  Post assessment: Post-op Vital signs reviewed, Patient's Cardiovascular Status Stable, Respiratory Function Stable, Patent Airway and No signs of Nausea or vomiting  Post vital signs: Reviewed and stable  Last Vitals:  Filed Vitals:   06/30/14 1140  BP: 101/60  Pulse: 75  Temp: 36.9 C  Resp: 12    Level of consciousness: awake, alert  and patient cooperative  Complications: No apparent anesthesia complications

## 2014-07-01 LAB — SURGICAL PATHOLOGY

## 2014-07-02 LAB — POCT PREGNANCY, URINE: Preg Test, Ur: NEGATIVE

## 2014-07-08 ENCOUNTER — Encounter: Payer: Self-pay | Admitting: Obstetrics and Gynecology

## 2014-07-08 ENCOUNTER — Ambulatory Visit (INDEPENDENT_AMBULATORY_CARE_PROVIDER_SITE_OTHER): Payer: BLUE CROSS/BLUE SHIELD | Admitting: Obstetrics and Gynecology

## 2014-07-08 VITALS — BP 105/64 | HR 73 | Ht 67.0 in | Wt 129.7 lb

## 2014-07-08 DIAGNOSIS — N809 Endometriosis, unspecified: Secondary | ICD-10-CM

## 2014-07-08 DIAGNOSIS — Z9889 Other specified postprocedural states: Secondary | ICD-10-CM

## 2014-07-08 DIAGNOSIS — Z09 Encounter for follow-up examination after completed treatment for conditions other than malignant neoplasm: Secondary | ICD-10-CM

## 2014-07-08 DIAGNOSIS — R102 Pelvic and perineal pain unspecified side: Secondary | ICD-10-CM

## 2014-07-08 NOTE — Progress Notes (Signed)
Patient ID: Bethany Harrison, female   DOB: 06-12-1985, 29 y.o.   MRN: [id number]  8 day post op  s/p lap with excision fulguration, chromopertubation fallopian tubes, remove iud. Incision- healing No fevers Bowel/bladder normal Spotting only after surgery No pain med needed  Subjective:     Bethany Harrison is a 29 y.o. female who presents to the clinic 1 weeks status post lysis of adhesions, laparoscopy and Chromopertubation for infertility, pelvic pain and endometriosis. Eating a regular diet without difficulty. Bowel movements are normal. The patient is not having any pain.  The following portions of the patient's history were reviewed and updated as appropriate: allergies, current medications, past family history, past medical history, past social history, past surgical history and problem list.  Review of Systems Pertinent items are noted in HPI.    Objective:    BP 105/64 mmHg  Pulse 73  Ht 5' 7"  (1.702 m)  Wt 129 lb 11.2 oz (58.832 kg)  BMI 20.31 kg/m2  LMP 06/27/2014 (Exact Date) General:  alert and cooperative  Abdomen: bowel sounds active, non-tender, no abnormal masses  Incision:   healing well, no dehiscence, incision well approximated     Assessment:    Doing well postoperatively. Operative findings again reviewed. Pathology report discussed.    Plan:    1. Start PNV's. 2. Wound care discussed. 3. Activity restrictions: Wait 1 cycle before trying to conceive. 4. Anticipated return to work: 1-2 weeks. 5. Follow up: 2 months for Annual Exam.

## 2014-07-22 ENCOUNTER — Ambulatory Visit: Payer: BLUE CROSS/BLUE SHIELD | Admitting: Obstetrics and Gynecology

## 2014-09-02 ENCOUNTER — Ambulatory Visit: Payer: Self-pay | Admitting: Certified Nurse Midwife

## 2014-09-04 ENCOUNTER — Encounter: Payer: Self-pay | Admitting: Obstetrics and Gynecology

## 2014-09-04 ENCOUNTER — Ambulatory Visit (INDEPENDENT_AMBULATORY_CARE_PROVIDER_SITE_OTHER): Payer: BLUE CROSS/BLUE SHIELD | Admitting: Obstetrics and Gynecology

## 2014-09-04 VITALS — BP 94/63 | HR 82 | Ht 65.5 in | Wt 131.8 lb

## 2014-09-04 DIAGNOSIS — Z01419 Encounter for gynecological examination (general) (routine) without abnormal findings: Secondary | ICD-10-CM | POA: Diagnosis not present

## 2014-09-04 DIAGNOSIS — N809 Endometriosis, unspecified: Secondary | ICD-10-CM | POA: Diagnosis not present

## 2014-09-04 DIAGNOSIS — N3010 Interstitial cystitis (chronic) without hematuria: Secondary | ICD-10-CM | POA: Diagnosis not present

## 2014-09-04 NOTE — Progress Notes (Signed)
Patient ID: Bethany Harrison, female   DOB: 06/10/1985, 29 y.o.   MRN: [id number] ANNUAL [facility] C[facility] GYN  ENCOUNTER NOTE  Subjective:       Bethany Harrison is a 29 y.o. G0P0 female here for a routine annual gynecologic exam.  Current complaints: 1.  none  2.  Endometriosis. 3.  Possible interstitial cystitis.  Patient is a 29 year old white female, para 0, using condoms for contraception, who presents for routine physical.  Past medical history is complex.  Patient has long history of symptomatic endometriosis, form of chronic pelvic pain and unstable bladder symptoms. She was initially diagnosed with endometriosis in 2009 through laparoscopy with peritoneal biopsies.  I performed the procedure.  At that time which demonstrated endometriosis on pathology in biopsies of the cul-de-sac, left uterosacral ligament, and bladder.  She has since completed a six-month course of Lupron therapy shortly after diagnosis.  She has been treated with multiple agents in the past including Depo-Provera, various OCP regimens, which seemed ineffective, and most recently the Mirena IUD; the IUD was taken out in the past 6 months. The patient has undergone hysteroscopy/D&C in the past without definitive pathology being identified; this did not regulate her heavy menstrual cycles. The patient also has been seen at [facility] in there.  Chronic pain clinic and had repeat laparoscopy done; note is unavailable for review.  Findings at that time. Patient has most recently been at [facility] where discussion of possible hysterectomy ensued.  Further discussion with the patient at her last visit demonstrates that she is interested in possibly attempting conception to have children.  Prior to definitive surgical management. Patient has had interval laparoscopy inJune 2016 with laparoscopy showing endometriosis type lesions in the cul-de-sac; biopsies showed benign fibroadenomatous tissue without obvious  endometriosis;, her Mirena IUD was removed at that time; Chromopertubation of the fallopian tubes demonstrated the tubes to be patent..  The patient does have significant unstable bladder symptoms in the form of dysuria, intermittent frequency, and a worsening of symptoms just prior to menses.  She does not have nocturia or urgency.  She does not have chronic UTIs.  Evaluation by urology has not demonstrated any endometriosis within the bladder on cystoscopy.  The patient does note bowel movements, which tend to be worse just prior to menses onset.  She does have premenstrual tenesmus and has increased frequency of stools without evidence of blood per rectum or melena.  The patient's dysmenorrhea is typically central cramping with low back pain with radiation into the buttocks and left leg.  She also is experiencing deep thrusting dyspareunia.  The patient does have family history of breast cancer and mom was diagnosed at age 28; she has had a mammogram, which was benign in the past several years.  There is no family history of ovarian cancer.     Gynecologic History Patient's last menstrual period was 08/23/2014 (exact date). Contraception: none Last Pap: 2015 wnl. Results were: normal Last mammogram: n/a. Results were: n/a  Obstetric History OB History  Gravida Para Term Preterm AB SAB TAB Ectopic Multiple Living  0                 Past Medical History  Diagnosis Date  . Ulcerative colitis   . Endometriosis   . GERD (gastroesophageal reflux disease)   . IC (interstitial cystitis)   . Kidney stone   . Hemorrhoid   . Abdominal pain   . Headache     Past Surgical History  Procedure Laterality Date  . Bunionectomy  08, 10  . Tonsillectomy    . Dilation and curettage of uterus  2013    polyp  . Appendectomy  2013    with laparoscopy @ [facility]  . Pelvic laparoscopy  2009    times 2  . Colonoscopy  2014  . Intrauterine device insertion  06/24/2011    Mirena IUD  .  Tonsillectomy    . Diagnostic laparoscopy    . Hernia repair  77    umbilical  . Chromopertubation N/A 06/30/2014    Procedure: CHROMOPERTUBATION;  Surgeon: [name], MD;  Location: [facility] ORS;  Service: Gynecology;  Laterality: N/A;  . Laparoscopy N/A 06/30/2014    Procedure:  with excision of endoemtriosis and biopsy;  Surgeon: [name], MD;  Location: [facility] ORS;  Service: Gynecology;  Laterality: N/A;  . Iud removal N/A 06/30/2014    Procedure: INTRAUTERINE DEVICE (IUD) REMOVAL;  Surgeon: [name], MD;  Location: [facility] ORS;  Service: Gynecology;  Laterality: N/A;    Current Outpatient Prescriptions on File Prior to Visit  Medication Sig Dispense Refill  . butalbital-acetaminophen-caffeine (FIORICET, ESGIC) 50-325-40 MG per tablet Take 1 tablet by mouth daily as needed for headache.     . fluticasone (FLONASE) 50 MCG/ACT nasal spray Place 2 sprays into both nostrils as needed for allergies or rhinitis.    . Meth-Hyo-M Bl-Na Phos-Ph Sal (URIBEL PO) Take 1 tablet by mouth daily as needed (bladder pain).     . topiramate (TOPAMAX) 25 MG tablet Take 25 mg by mouth daily.     No current facility-administered medications on file prior to visit.    Allergies  Allergen Reactions  . Sulfa Antibiotics Nausea Only    Social History   Social History  . Marital Status: Single    Spouse Name: N/A  . Number of Children: N/A  . Years of Education: N/A   Occupational History  . [employer]   Social History Main Topics  . Smoking status: Never Smoker   . Smokeless tobacco: Never Used  . Alcohol Use: 0.0 oz/week    0 Standard drinks or equivalent per week     Comment: 3 a month  . Drug Use: No  . Sexual Activity:    Partners: Male    Birth Control/ Protection: None   Other Topics Concern  . Not on file   Social History Narrative    Family History  Problem Relation Age of Onset  . Breast cancer Mother 19  . Cancer Paternal Grandmother      colon  . Diabetes Paternal Grandmother   . Heart disease Neg Hx   . Ovarian cancer Neg Hx     The following portions of the patient's history were reviewed and updated as appropriate: allergies, current medications, past family history, past medical history, past social history, past surgical history and problem list.  Review of Systems ROS Review of Systems - General ROS: negative for - chills, fatigue, fever, hot flashes, night sweats, weight gain or weight loss Psychological ROS: negative for - anxiety, decreased libido, depression, mood swings, physical abuse or sexual abuse Ophthalmic ROS: negative for - blurry vision, eye pain or loss of vision ENT ROS: negative for - headaches, hearing change, visual changes or vocal changes Allergy and Immunology ROS: negative for - hives, itchy/watery eyes or seasonal allergies Hematological and Lymphatic ROS: negative for - bleeding problems, bruising, swollen lymph nodes or weight loss Endocrine ROS:  negative for - galactorrhea, hair pattern changes, hot flashes, malaise/lethargy, mood swings, palpitations, polydipsia/polyuria, skin changes, temperature intolerance or unexpected weight changes Breast ROS: negative for - new or changing breast lumps or nipple discharge Respiratory ROS: negative for - cough or shortness of breath Cardiovascular ROS: negative for - chest pain, irregular heartbeat, palpitations or shortness of breath Gastrointestinal ROS: no abdominal pain, change in bowel habits, or black or bloody stools Genito-Urinary ROS: no dysuria, trouble voiding, or hematuria Musculoskeletal ROS: negative for - joint pain or joint stiffness Neurological ROS: negative for - bowel and bladder control changes Dermatological ROS: negative for rash and skin lesion changes   Objective:   BP 94/63 mmHg  Pulse 82  Ht 5' 5.5" (1.664 m)  Wt 131 lb 12.8 oz (59.784 kg)  BMI 21.59 kg/m2  LMP 08/23/2014 (Exact Date) CONSTITUTIONAL: Well-developed,  well-nourished female in no acute distress.  PSYCHIATRIC: Normal mood and affect. Normal behavior. Normal judgment and thought content. [place]: Alert and oriented to person, place, and time. Normal muscle tone coordination. No cranial nerve deficit noted. HENT:  Normocephalic, atraumatic, External right and left ear normal. Oropharynx is clear and moist EYES: Conjunctivae and EOM are normal. Pupils are equal, round, and reactive to light. No scleral icterus.  NECK: Normal range of motion, supple, no masses.  Normal thyroid.  SKIN: Skin is warm and dry. No rash noted. Not diaphoretic. No erythema. No pallor. CARDIOVASCULAR: Normal heart rate noted, regular rhythm, no murmur. RESPIRATORY: Clear to auscultation bilaterally. Effort and breath sounds normal, no problems with respiration noted. BREASTS: Symmetric in size. No masses, skin changes, nipple drainage, or lymphadenopathy. ABDOMEN: Soft, normal bowel sounds, no distention noted.  No tenderness, rebound or guarding.  BLADDER: Normal PELVIC:  External Genitalia: Normal  BUS: Normal  Vagina: Normal  Cervix: Normal; minimal cervical motion tenderness  Uterus: Normal; minimal uterine tenderness; midplane, normal size, shape  Adnexa: Normal without palpable mass; slight left lower quadrant tenderness  RV: External Exam NormaI  MUSCULOSKELETAL: Normal range of motion. No tenderness.  No cyanosis, clubbing, or edema.  2+ distal pulses. LYMPHATIC: No Axillary, Supraclavicular, or Inguinal Adenopathy.    Assessment:   Annual gynecologic examination 29 y.o. Contraception: Condoms Normal BMI Endometriosis, stable off medication  Plan:  Pap: Pap Co Test Mammogram: n/a Stool Guaiac Testing:  Not Ordered Labs: thru employer Routine preventative health maintenance measures emphasized: Exercise/Diet/Weight control, Tobacco Warnings, Alcohol/Substance use risks, Stress Management and Safe Sex Continue with prenatal vitamins  daily. Contraception-condoms. Return after 12 months of attempted conception if unsuccessful, because of the history of endometriosis and her partner's low sperm count. Return to [place], [place] [name], MD

## 2014-09-04 NOTE — Patient Instructions (Signed)
1.  Pap smear done. 2.  Continue self breast exam monthly. 3.  Continue taking prenatal vitamins. 4.  Return in 1 year or as needed.

## 2014-09-07 LAB — PAP IG AND HPV HIGH-RISK: PAP Smear Comment: 0

## 2014-12-24 ENCOUNTER — Ambulatory Visit: Payer: BLUE CROSS/BLUE SHIELD | Admitting: Podiatry

## 2015-01-18 HISTORY — PX: BREAST CYST EXCISION: SHX579

## 2015-01-22 ENCOUNTER — Other Ambulatory Visit: Payer: Self-pay | Admitting: Family Medicine

## 2015-01-22 DIAGNOSIS — N63 Unspecified lump in unspecified breast: Secondary | ICD-10-CM

## 2015-01-30 ENCOUNTER — Ambulatory Visit
Admission: RE | Admit: 2015-01-30 | Discharge: 2015-01-30 | Disposition: A | Discharge status: Home / Self Care | Payer: BLUE CROSS/BLUE SHIELD | Source: Ambulatory Visit | Attending: Family Medicine | Admitting: Family Medicine

## 2015-01-30 DIAGNOSIS — N63 Unspecified lump in unspecified breast: Secondary | ICD-10-CM

## 2015-01-30 DIAGNOSIS — L723 Sebaceous cyst: Secondary | ICD-10-CM | POA: Insufficient documentation

## 2015-05-25 ENCOUNTER — Other Ambulatory Visit: Payer: Self-pay | Admitting: Family Medicine

## 2015-05-25 DIAGNOSIS — R599 Enlarged lymph nodes, unspecified: Secondary | ICD-10-CM

## 2015-05-27 ENCOUNTER — Ambulatory Visit
Admission: RE | Admit: 2015-05-27 | Discharge: 2015-05-27 | Disposition: A | Discharge status: Home / Self Care | Payer: BLUE CROSS/BLUE SHIELD | Source: Ambulatory Visit | Attending: Family Medicine | Admitting: Family Medicine

## 2015-05-27 DIAGNOSIS — R599 Enlarged lymph nodes, unspecified: Secondary | ICD-10-CM

## 2015-09-07 NOTE — Progress Notes (Signed)
ANNUAL PREVENTATIVE CARE GYN  ENCOUNTER NOTE  Subjective:       Bethany Harrison is a 30 y.o. G0P0 female here for a routine annual gynecologic exam.  Current complaints: 1.   Discuss hyst  Patient has long history of symptomatic endometriosis and interstitial cystitis. Review of medical history:    Patient has long history of symptomatic endometriosis, form of chronic pelvic pain and unstable bladder symptoms. She was initially diagnosed with endometriosis in 2009 through laparoscopy with peritoneal biopsies.  I performed the procedure.  At that time which demonstrated endometriosis on pathology in biopsies of the cul-de-sac, left uterosacral ligament, and bladder.  She has since completed a six-month course of Lupron therapy shortly after diagnosis.  She has been treated with multiple agents in the past including Depo-Provera, various OCP regimens, which seemed ineffective, and most recently the Mirena IUD; the IUD was taken out in the past 6 months. The patient has undergone hysteroscopy/D&C in the past without definitive pathology being identified; this did not regulate her heavy menstrual cycles. The patient also has been seen at [facility] in there.  Chronic pain clinic and had repeat laparoscopy done; note is unavailable for review.  Findings at that time. Patient has most recently been at [facility] where discussion of possible hysterectomy ensued.  Further discussion with the patient at her last visit demonstrates that she is interested in possibly attempting conception to have children.  Prior to definitive surgical management. Patient has had interval laparoscopy inJune 2016 with laparoscopy showing endometriosis type lesions in the cul-de-sac; biopsies showed benign fibroadenomatous tissue without obvious endometriosis;, her Mirena IUD was removed at that time; Chromopertubation of the fallopian tubes demonstrated the tubes to be patent..  The patient does have significant  unstable bladder symptoms in the form of dysuria, intermittent frequency, and a worsening of symptoms just prior to menses.  She does not have nocturia or urgency.  She does not have chronic UTIs.  Evaluation by urology has not demonstrated any endometriosis within the bladder on cystoscopy.  The patient does note bowel movements, which tend to be worse just prior to menses onset.  She does have premenstrual tenesmus and has increased frequency of stools without evidence of blood per rectum or melena.  The patient's dysmenorrhea is typically central cramping with low back pain with radiation into the buttocks and left leg.  She also is experiencing deep thrusting dyspareunia.   Gynecologic History No LMP recorded. Contraception:none Last Pap: 08/2014 neg/neg. Results were: normal Last mammogram: n/a. Results were:   Obstetric History OB History  Gravida Para Term Preterm AB Living  0            SAB TAB Ectopic Multiple Live Births                   Past Medical History:  Diagnosis Date  . Abdominal pain   . Endometriosis   . GERD (gastroesophageal reflux disease)   . Headache   . Hemorrhoid   . IC (interstitial cystitis)   . Kidney stone   . Ulcerative colitis     Past Surgical History:  Procedure Laterality Date  . APPENDECTOMY  2013   with laparoscopy @ [facility]  . BUNIONECTOMY  08, 10  . CHROMOPERTUBATION N/A 06/30/2014   Procedure: CHROMOPERTUBATION;  Surgeon: [name], MD;  Location: [facility] ORS;  Service: Gynecology;  Laterality: N/A;  . COLONOSCOPY  2014  . DIAGNOSTIC LAPAROSCOPY    . DILATION AND  CURETTAGE OF UTERUS  2013   polyp  . HERNIA REPAIR  89   umbilical  . INTRAUTERINE DEVICE INSERTION  06/24/2011   Mirena IUD  . IUD REMOVAL N/A 06/30/2014   Procedure: INTRAUTERINE DEVICE (IUD) REMOVAL;  Surgeon: [name], MD;  Location: [facility] ORS;  Service: Gynecology;  Laterality: N/A;  . LAPAROSCOPY N/A 06/30/2014   Procedure:  with excision  of endoemtriosis and biopsy;  Surgeon: [name], MD;  Location: [facility] ORS;  Service: Gynecology;  Laterality: N/A;  . PELVIC LAPAROSCOPY  2009   times 2  . TONSILLECTOMY    . TONSILLECTOMY      Current Outpatient Prescriptions on File Prior to Visit  Medication Sig Dispense Refill  . butalbital-acetaminophen-caffeine (FIORICET, ESGIC) 50-325-40 MG per tablet Take 1 tablet by mouth daily as needed for headache.     . fluticasone (FLONASE) 50 MCG/ACT nasal spray Place 2 sprays into both nostrils as needed for allergies or rhinitis.    . Meth-Hyo-M Bl-Na Phos-Ph Sal (URIBEL PO) Take 1 tablet by mouth daily as needed (bladder pain).     . topiramate (TOPAMAX) 25 MG tablet Take 25 mg by mouth daily.     No current facility-administered medications on file prior to visit.     Allergies  Allergen Reactions  . Sulfa Antibiotics Nausea Only    Social History   Social History  . Marital status: Single    Spouse name: N/A  . Number of children: N/A  . Years of education: N/A   Occupational History  . [employer]   Social History Main Topics  . Smoking status: Never Smoker  . Smokeless tobacco: Never Used  . Alcohol use 0.0 oz/week     Comment: 3 a month  . Drug use: No  . Sexual activity: Yes    Partners: Male    Birth control/ protection: None   Other Topics Concern  . Not on file   Social History Narrative  . No narrative on file    Family History  Problem Relation Age of Onset  . Breast cancer Mother 76  . Cancer Paternal Grandmother     colon  . Diabetes Paternal Grandmother   . Heart disease Neg Hx   . Ovarian cancer Neg Hx     The following portions of the patient's history were reviewed and updated as appropriate: allergies, current medications, past family history, past medical history, past social history, past surgical history and problem list.  Review of Systems ROS Review of Systems - General ROS: negative for - chills, fatigue,  fever, hot flashes, night sweats, weight gain or weight loss Psychological ROS: negative for - anxiety, decreased libido, depression, mood swings, physical abuse or sexual abuse Ophthalmic ROS: negative for - blurry vision, eye pain or loss of vision ENT ROS: negative for - headaches, hearing change, visual changes or vocal changes Allergy and Immunology ROS: negative for - hives, itchy/watery eyes or seasonal allergies Hematological and Lymphatic ROS: negative for - bleeding problems, bruising, swollen lymph nodes or weight loss Endocrine ROS: negative for - galactorrhea, hair pattern changes, hot flashes, malaise/lethargy, mood swings, palpitations, polydipsia/polyuria, skin changes, temperature intolerance or unexpected weight changes Breast ROS: negative for - new or changing breast lumps or nipple discharge Respiratory ROS: negative for - cough or shortness of breath Cardiovascular ROS: negative for - chest pain, irregular heartbeat, palpitations or shortness of breath Gastrointestinal ROS: no abdominal pain, change in bowel habits, or black or bloody  stools Genito-Urinary ROS: no dysuria, trouble voiding, or hematuria Musculoskeletal ROS: negative for - joint pain or joint stiffness Neurological ROS: negative for - bowel and bladder control changes Dermatological ROS: negative for rash and skin lesion changes   Objective:   BP 137/80   Pulse 93   Ht 5\' 5"  (1.651 m)   Wt 135 lb 11.2 oz (61.6 kg)   LMP 08/28/2015 (Exact Date)   BMI 22.58 kg/m  CONSTITUTIONAL: Well-developed, well-nourished female in no acute distress.  PSYCHIATRIC: Normal mood and affect. Normal behavior. Normal judgment and thought content. [place]: Alert and oriented to person, place, and time. Normal muscle tone coordination. No cranial nerve deficit noted. HENT:  Normocephalic, atraumatic, External right and left ear normal. Oropharynx is clear and moist EYES: Conjunctivae and EOM are normal. No scleral  icterus.  NECK: Normal range of motion, supple, no masses.  Normal thyroid.  SKIN: Skin is warm and dry. No rash noted. Not diaphoretic. No erythema. No pallor. CARDIOVASCULAR: Normal heart rate noted, regular rhythm, no murmur. RESPIRATORY: Clear to auscultation bilaterally. Effort and breath sounds normal, no problems with respiration noted. BREASTS: Symmetric in size. No masses, skin changes, nipple drainage, or lymphadenopathy. ABDOMEN: Soft, normal bowel sounds, no distention noted.  No tenderness, rebound or guarding.  BLADDER: Normal PELVIC:  External Genitalia: Normal  BUS: Normal  Vagina: Normal  Cervix: Normal; mild cervical motion tenderness 1/4  Uterus: Normal; midplane to retroverted; mildly tender 1/4; mobile  Adnexa: Normal; nonpalpable and nontender  RV: External Exam NormaI  MUSCULOSKELETAL: Normal range of motion. No tenderness.  No cyanosis, clubbing, or edema.  2+ distal pulses. LYMPHATIC: No Axillary, Supraclavicular, or Inguinal Adenopathy.    Assessment:   Annual gynecologic examination 30 y.o. Contraception:none Normal BMI Problem List Items Addressed This Visit    None    Visit Diagnoses    Well woman exam with routine gynecological exam    -  Primary   Endometriosis        Symptomatic endometriosis; patient contemplating hysterectomy given the fact that she and spouse had unprotected intercourse for 6 years without success (husband and low sperm count). At this time the couple is content with not having children and is willing to proceed with improving quality of life through definitive surgery. Patient is an LAVH candidate  Plan:  Pap: due 08/2017 Mammogram: due age 54 Stool Guaiac Testing:  Not Indicated Labs: thru pcp Routine preventative health maintenance measures emphasized: Exercise/Diet/Weight control, Tobacco Warnings, Alcohol/Substance use risks, Stress Management and Peer Pressure Issues Patient is to contact us if definitive surgery for  hysterectomy is desired. LAVH RSO and left salpingectomy is recommended if procedure is to be completed. Return to [place], [place]  [name], MD  Note: This dictation was prepared with Dragon dictation along with smaller phrase technology. Any transcriptional errors that result from this process are unintentional.

## 2015-09-10 ENCOUNTER — Ambulatory Visit (INDEPENDENT_AMBULATORY_CARE_PROVIDER_SITE_OTHER): Payer: BLUE CROSS/BLUE SHIELD | Admitting: Obstetrics and Gynecology

## 2015-09-10 ENCOUNTER — Encounter: Payer: Self-pay | Admitting: Obstetrics and Gynecology

## 2015-09-10 VITALS — BP 137/80 | HR 93 | Ht 65.0 in | Wt 135.7 lb

## 2015-09-10 DIAGNOSIS — N809 Endometriosis, unspecified: Secondary | ICD-10-CM | POA: Diagnosis not present

## 2015-09-10 DIAGNOSIS — Z01419 Encounter for gynecological examination (general) (routine) without abnormal findings: Secondary | ICD-10-CM | POA: Diagnosis not present

## 2015-09-10 NOTE — Patient Instructions (Signed)
1. No Pap smear today (not indicated) 2. Self breast exam encouraged monthly basis 3. Continue with healthy eating and exercise 4. Return in 1 year for annual exam 5. Discussed the pros and cons of hysterectomy for management of symptomatic endometriosis. Patient is a candidate for LAVH RSO and left salpingectomy. Patient is to call if she wishes to set up surgery  Laparoscopically Assisted Vaginal Hysterectomy A laparoscopically assisted vaginal hysterectomy (LAVH) is a surgical procedure to remove the uterus and cervix, and sometimes the ovaries and fallopian tubes. During an LAVH, some of the surgical removal is done through the vagina, and the rest is done through a few small surgical cuts (incisions) in the abdomen.  This procedure is usually considered in women when a vaginal hysterectomy is not an option. Your health care provider will discuss the risks and benefits of the different surgical techniques at your appointment. Generally, recovery time is faster and there are fewer complications after laparoscopic procedures than after open incisional procedures. LET [facility] CARE PROVIDER KNOW ABOUT:   Any allergies you have.  All medicines you are taking, including vitamins, herbs, eye drops, creams, and over-the-counter medicines.  Previous problems you or members of your family have had with the use of anesthetics.  Any blood disorders you have.  Previous surgeries you have had.  Medical conditions you have. RISKS AND COMPLICATIONS Generally, this is a safe procedure. However, as with any procedure, complications can occur. Possible complications include:  Allergies to medicines.  Difficulty breathing.  Bleeding.  Infection.  Damage to other structures near your uterus and cervix. BEFORE THE PROCEDURE  Ask your health care provider about changing or stopping your regular medicines.  Take certain medicines, such as a colon-emptying preparation, as directed.  Do not eat  or drink anything for at least 8 hours before your surgery.  Stop smoking if you smoke. Stopping will improve your health after surgery.  Arrange for a ride home after surgery and for help at home during recovery. PROCEDURE   An IV tube will be put into one of your veins in order to give you fluids and medicines.  You will receive medicines to relax you and medicines that make you sleep (general anesthetic).  You may have a flexible tube (catheter) put into your bladder to drain urine.  You may have a tube put through your nose or mouth that goes into your stomach (nasogastric tube). The nasogastric tube removes digestive fluids and prevents you from feeling nauseated and from vomiting.  Tight-fitting (compression) stockings will be placed on your legs to promote circulation.  Three to four small incisions will be made in your abdomen. An incision also will be made in your vagina. Probes and tools will be inserted into the small incisions. The uterus and cervix are removed (and possibly your ovaries and fallopian tubes) through your vagina as well as through the small incisions that were made in the abdomen.  Your vagina is then sewn back to normal. AFTER THE PROCEDURE  You may have a liquid diet temporarily. You will most likely return to, and tolerate, your usual diet the day after surgery.  You will be passing urine through a catheter. It will be removed the day after surgery.  Your temperature, breathing rate, heart rate, blood pressure, and oxygen level will be monitored regularly.  You will still wear compression stockings on your legs until you are able to move around.  You will use a special device or do breathing exercises  to keep your lungs clear.  You will be encouraged to walk as soon as possible.   This information is not intended to replace advice given to you by your health care provider. Make sure you discuss any questions you have with your health care provider.    Document Released: 12/23/2010 Document Revised: 01/24/2014 Document Reviewed: 07/19/2012 Elsevier Interactive Patient Education [employer].

## 2016-03-15 ENCOUNTER — Encounter: Payer: Self-pay | Admitting: *Deleted

## 2016-03-15 ENCOUNTER — Emergency Department
Admission: EM | Admit: 2016-03-15 | Discharge: 2016-03-15 | Disposition: A | Discharge status: Home / Self Care | Payer: BLUE CROSS/BLUE SHIELD | Attending: Emergency Medicine | Admitting: Emergency Medicine

## 2016-03-15 DIAGNOSIS — G43801 Other migraine, not intractable, with status migrainosus: Secondary | ICD-10-CM | POA: Insufficient documentation

## 2016-03-15 DIAGNOSIS — G43909 Migraine, unspecified, not intractable, without status migrainosus: Secondary | ICD-10-CM | POA: Diagnosis present

## 2016-03-15 MED ORDER — PROCHLORPERAZINE EDISYLATE 5 MG/ML IJ SOLN
10.0000 mg | Freq: Once | INTRAMUSCULAR | Status: AC
  Administered 2016-03-15: 10 mg via INTRAVENOUS

## 2016-03-15 MED ORDER — KETOROLAC TROMETHAMINE 30 MG/ML IJ SOLN
30.0000 mg | Freq: Once | INTRAMUSCULAR | Status: AC
  Administered 2016-03-15: 30 mg via INTRAVENOUS

## 2016-03-15 MED ORDER — DIPHENHYDRAMINE HCL 50 MG/ML IJ SOLN
25.0000 mg | Freq: Once | INTRAMUSCULAR | Status: AC
  Administered 2016-03-15: 25 mg via INTRAVENOUS

## 2016-03-15 MED ORDER — PROCHLORPERAZINE EDISYLATE 5 MG/ML IJ SOLN
INTRAMUSCULAR | Status: AC
  Filled 2016-03-15: qty 2

## 2016-03-15 MED ORDER — DIPHENHYDRAMINE HCL 50 MG/ML IJ SOLN
INTRAMUSCULAR | Status: AC
  Filled 2016-03-15: qty 1

## 2016-03-15 MED ORDER — SODIUM CHLORIDE 0.9 % IV BOLUS (SEPSIS)
1000.0000 mL | Freq: Once | INTRAVENOUS | Status: AC
  Administered 2016-03-15: 1000 mL via INTRAVENOUS

## 2016-03-15 MED ORDER — KETOROLAC TROMETHAMINE 30 MG/ML IJ SOLN
INTRAMUSCULAR | Status: AC
  Filled 2016-03-15: qty 1

## 2016-03-15 NOTE — ED Provider Notes (Signed)
[facility] Emergency Department Provider Note  ____________________________________________  Time seen: Approximately 9:30 PM  I have reviewed the triage vital signs and the nursing notes.   HISTORY  Chief Complaint Migraine    HPI Bethany Harrison is a 31 y.o. female with a history of migraines presenting with a migraine. The patient reports that she has a hormonal trigger to her migraines and just is finishing her period. Since yesterday, she has had a progressively worsening headache in the left frontal scalp and when she walks she gets sharp pains across the forehead. She has had photosensitivity but no visual changes. She has had nausea but no vomiting. No fevers, chills, neck pain or stiffness, trauma, lightheadedness or syncope. No numbness tingling or weakness. Crying makes her headache worse, Fioricet did not improve her pain today. She reports multiple monthly headaches with occasional more severe migraines requiring ED evaluation in the past.   Past Medical History:  Diagnosis Date  . Abdominal pain   . Endometriosis   . GERD (gastroesophageal reflux disease)   . Headache   . Hemorrhoid   . IC (interstitial cystitis)   . Kidney stone   . Ulcerative colitis [facility])     Patient Active Problem List   Diagnosis Date Noted  . Interstitial cystitis 09/04/2014  . Endometriosis determined by laparoscopy 06/30/2014  . Hemorrhoid   . Abdominal pain     Past Surgical History:  Procedure Laterality Date  . APPENDECTOMY  2013   with laparoscopy @ [facility]  . BUNIONECTOMY  08, 10  . CHROMOPERTUBATION N/A 06/30/2014   Procedure: CHROMOPERTUBATION;  Surgeon: [name], MD;  Location: [facility] ORS;  Service: Gynecology;  Laterality: N/A;  . COLONOSCOPY  2014  . DIAGNOSTIC LAPAROSCOPY    . DILATION AND CURETTAGE OF UTERUS  2013   polyp  . HERNIA REPAIR  87   umbilical  . INTRAUTERINE DEVICE INSERTION  06/24/2011   Mirena IUD  . IUD REMOVAL N/A  06/30/2014   Procedure: INTRAUTERINE DEVICE (IUD) REMOVAL;  Surgeon: [name], MD;  Location: [facility] ORS;  Service: Gynecology;  Laterality: N/A;  . LAPAROSCOPY N/A 06/30/2014   Procedure:  with excision of endoemtriosis and biopsy;  Surgeon: [name], MD;  Location: [facility] ORS;  Service: Gynecology;  Laterality: N/A;  . PELVIC LAPAROSCOPY  2009   times 2  . TONSILLECTOMY    . TONSILLECTOMY      Current Outpatient Rx  . Order #: [id number] Class: Historical Med  . Order #: [id number] Class: Historical Med    Allergies Sulfa antibiotics  Family History  Problem Relation Age of Onset  . Breast cancer Mother 19  . Cancer Paternal Grandmother     colon  . Diabetes Paternal Grandmother   . Heart disease Neg Hx   . Ovarian cancer Neg Hx     Social History Social History  Substance Use Topics  . Smoking status: Never Smoker  . Smokeless tobacco: Never Used  . Alcohol use 0.0 oz/week     Comment: 3 a month    Review of Systems Constitutional: No fever/chills.No lightheadedness or syncope. No trauma. Eyes: No visual changes. No blurred or double vision. Positive photosensitivity. ENT: No sore throat. No congestion or rhinorrhea. Positive photosensitivity. No ear pain. Cardiovascular: Denies chest pain. Denies palpitations. Respiratory: Denies shortness of breath.  No cough. Gastrointestinal: No abdominal pain.  No nausea, no vomiting.  No diarrhea.  No constipation. Genitourinary: Negative for dysuria. Musculoskeletal: Negative for back  pain. Skin: Negative for rash. Neurological: Positive for headaches. No focal numbness, tingling or weakness. No changes in speech or vision. No changes in mental status. No difficulty walking.  10-point ROS otherwise negative.  ____________________________________________   PHYSICAL EXAM:  VITAL SIGNS: ED Triage Vitals  Enc Vitals Group     BP 03/15/16 2122 108/78     Pulse Rate 03/15/16 2122 71     Resp 03/15/16 2122  16     Temp 03/15/16 2122 98.1 F (36.7 C)     Temp Source 03/15/16 2122 Oral     SpO2 03/15/16 2122 100 %     Weight 03/15/16 2122 138 lb (62.6 kg)     Height 03/15/16 2122 5\' 6"  (1.676 m)     Head Circumference --      Peak Flow --      Pain Score 03/15/16 2123 9     Pain Loc --      Pain Edu? --      Excl. in [place] and oriented and answering questions appropriate. The patient is uncomfortable appearing but nontoxic. Eyes: Conjunctivae are injected bilaterally.  EOMI. PERRLA. No horizontal or vertical nystagmus. No scleral icterus. Head: Atraumatic. No raccoon eyes or Battle sign. Nose: No congestion/rhinnorhea. Mouth/Throat: Mucous membranes are moist.  Neck: No stridor.  Supple.  No JVD. No meningismus. Cardiovascular: Normal rate, regular rhythm. No murmurs, rubs or gallops.  Respiratory: Normal respiratory effort.  No accessory muscle use or retractions. Lungs CTAB.  No wheezes, rales or ronchi. Gastrointestinal: Soft, nontender and nondistended.  No guarding or rebound.  No peritoneal signs. Musculoskeletal: No LE edema. No ttp in the calves or palpable cords.  Negative Homan's sign. Neurologic:  A&Ox3.  Speech is clear.  Face and smile are symmetric.  EOMI.  PERRLA. No horizontal or vertical nystagmus. Normal gait without ataxia. Moves all extremities well. Skin:  Skin is warm, dry and intact. No rash noted. Psychiatric: Mood and affect are normal. Speech and behavior are normal.  Normal judgement.  ____________________________________________   LABS (all labs ordered are listed, but only abnormal results are displayed)  Labs Reviewed - No data to display ____________________________________________  EKG  Not indicated ____________________________________________  RADIOLOGY  No results found.  ____________________________________________   PROCEDURES  Procedure(s) performed: None  Procedures  Critical Care performed:  No ____________________________________________   INITIAL IMPRESSION / ASSESSMENT AND PLAN / ED COURSE  Pertinent labs & imaging results that were available during my care of the patient were reviewed by me and considered in my medical decision making (see chart for details).  31 y.o. female with a history of migraines presenting with migraine that is not responsive to her home Fioricet treatment. Overall, the patient has reassuring vital signs and is afebrile. She has no evidence of meningismus. She has no history that would be suggestive of acute trauma, and intracranial mass or injury including subarachnoid hemorrhage is very unlikely. At this time, I'll treat her symptomatically and reevaluate her for final disposition.  ----------------------------------------- 10:19 PM on 03/15/2016 -----------------------------------------  The patient's headache has resolved, she is much more comfortable and able to keep down liquids. At this time, plan discharge. Return precautions as well as follow-up instructions were discussed. ____________________________________________  FINAL CLINICAL IMPRESSION(S) / ED DIAGNOSES  Final diagnoses:  Other migraine with status migrainosus, not intractable         NEW MEDICATIONS STARTED DURING THIS VISIT:  New Prescriptions   No medications on file  Eula Listen, MD 03/15/16 2219

## 2016-03-15 NOTE — ED Triage Notes (Signed)
Pt c/o headache since yesterday. Pt states hx of migraines. Pt last took [place] at 1800 today w/o relief. Pt dnies n/v at [facility] time. Pt c/o photosensitivity.

## 2016-03-15 NOTE — ED Notes (Signed)
Dr Mariea Clonts at bedside - pt states hx of migraine - tonight starts with severe headache that runs across forehead to the back of head - c/o nausea without vomiting - reports pain in bilat eyes and no relief from [employer]

## 2016-03-15 NOTE — Discharge Instructions (Signed)
Please drink plenty of fluids stay well-hydrated, get plenty of rest, eat small regular meals throughout the day. If you begin to develop a migraine, please take your medications early as it is easier to treat her migraine earlier than waiting until it is full-blown.  Please return to the emergency department if you develop severe pain, numbness tingling or weakness, visual or speech changes, fever, neck pain or stiffness, or any other symptoms concerning to you.

## 2016-03-23 ENCOUNTER — Encounter: Payer: Self-pay | Admitting: Obstetrics and Gynecology

## 2016-03-23 ENCOUNTER — Ambulatory Visit (INDEPENDENT_AMBULATORY_CARE_PROVIDER_SITE_OTHER): Payer: BLUE CROSS/BLUE SHIELD | Admitting: Obstetrics and Gynecology

## 2016-03-23 VITALS — BP 93/61 | HR 66 | Ht 66.0 in | Wt 140.4 lb

## 2016-03-23 DIAGNOSIS — Z309 Encounter for contraceptive management, unspecified: Secondary | ICD-10-CM | POA: Diagnosis not present

## 2016-03-23 DIAGNOSIS — R102 Pelvic and perineal pain: Secondary | ICD-10-CM | POA: Diagnosis not present

## 2016-03-23 DIAGNOSIS — Z8669 Personal history of other diseases of the nervous system and sense organs: Secondary | ICD-10-CM | POA: Insufficient documentation

## 2016-03-23 DIAGNOSIS — N809 Endometriosis, unspecified: Secondary | ICD-10-CM

## 2016-03-23 DIAGNOSIS — Z30430 Encounter for insertion of intrauterine contraceptive device: Secondary | ICD-10-CM

## 2016-03-23 LAB — POCT URINE PREGNANCY: Preg Test, Ur: NEGATIVE

## 2016-03-23 NOTE — Patient Instructions (Signed)
1. Return in 4 weeks for IUD string check   Intrauterine Device Insertion, Care After This sheet gives you information about how to care for yourself after your procedure. Your health care provider may also give you more specific instructions. If you have problems or questions, contact your health care provider. What can I expect after the procedure? After the procedure, it is common to have:  Cramps and pain in the abdomen.  Light bleeding (spotting) or heavier bleeding that is like your menstrual period. This may last for up to a few days.  Lower back pain.  Dizziness.  Headaches.  Nausea. Follow these instructions at home:  Before resuming sexual activity, check to make sure that you can feel the IUD string(s). You should be able to feel the end of the string(s) below the opening of your cervix. If your IUD string is in place, you may resume sexual activity.  If you had a hormonal IUD inserted more than 7 days after your most recent period started, you will need to use a backup method of birth control for 7 days after IUD insertion. Ask your health care provider whether this applies to you.  Continue to check that the IUD is still in place by feeling for the string(s) after every menstrual period, or once a month.  Take over-the-counter and prescription medicines only as told by your health care provider.  Do not drive or use heavy machinery while taking prescription pain medicine.  Keep all follow-up visits as told by your health care provider. This is important. Contact a health care provider if:  You have bleeding that is heavier or lasts longer than a normal menstrual cycle.  You have a fever.  You have cramps or abdominal pain that get worse or do not get better with medicine.  You develop abdominal pain that is new or is not in the same area of earlier cramping and pain.  You feel lightheaded or weak.  You have abnormal or bad-smelling discharge from your  vagina.  You have pain during sexual activity.  You have any of the following problems with your IUD string(s):  The string bothers or hurts you or your sexual partner.  You cannot feel the string.  The string has gotten longer.  You can feel the IUD in your vagina.  You think you may be pregnant, or you miss your menstrual period.  You think you may have an STI (sexually transmitted infection). Get help right away if:  You have flu-like symptoms.  You have a fever and chills.  You can feel that your IUD has slipped out of place. Summary  After the procedure, it is common to have cramps and pain in the abdomen. It is also common to have light bleeding (spotting) or heavier bleeding that is like your menstrual period.  Continue to check that the IUD is still in place by feeling for the string(s) after every menstrual period, or once a month.  Keep all follow-up visits as told by your health care provider. This is important.  Contact your health care provider if you have problems with your IUD string(s), such as the string getting longer or bothering you or your sexual partner. This information is not intended to replace advice given to you by your health care provider. Make sure you discuss any questions you have with your health care provider. Document Released: 09/01/2010 Document Revised: 11/25/2015 Document Reviewed: 11/25/2015 Elsevier Interactive Patient Education  2017 [employer].

## 2016-03-23 NOTE — Progress Notes (Signed)
Chief complaint: 1. Endometriosis 2. Contraceptive management  Patient presents for 6 month follow-up. She is not currently on any contraception. She has been recently diagnosed with migraine headaches and is to be started on Topamax. Her neurologist recommends that she get on a hormonal contraceptive before going on the Topamax. Review of prior hormonal contraception used by the patient reveals that the only option available for this patient is Mirena IUD. Patient was intolerant of continuous oral contraceptives, Depo-Provera; she is not interested in the implantable progestin. Husband is not willing to give vasectomy. Patient does not want to proceed with surgery for tubal ligation. Patient opts for the Mirena IUD which will help her with cycle regulation, pelvic pain, and contraception so that she may go on Topamax.  Interstitial cystitis symptoms are stable.  Past medical history, past surgical history, problems, medications, and allergies are reviewed  OBJECTIVE: BP 93/61   Pulse 66   Ht 5' 6"  (1.676 m)   Wt 140 lb 6.4 oz (63.7 kg)   LMP 03/10/2016 (Exact Date)   BMI 22.66 kg/m  Pleasant female in no acute distress. Alert and oriented. Affect is appropriate. Back: No CVA tenderness Abdomen: Soft, nontender, without organomegaly Pelvic exam: Bimanual exam-midplane uterus of normal size and shape, nontender, mobile; no cervical motion tenderness; adnexa nonpalpable and nontender  PROCEDURE:  IUD Insertion Procedure Note utilizing paracervical block  Pre-operative Diagnosis: Contraception; history of endometriosis; new onset migraine headaches  Post-operative Diagnosis: same  Indications: contraception  Procedure Details  Urine pregnancy test was done  and result was negative.  The risks (including infection, bleeding, pain, and uterine perforation) and benefits of the procedure were explained to the patient and Verbal informed consent was obtained.    Cervix cleansed with  Betadine. Paracervical block was placed using 1% lidocaine without epinephrine, 10 cc, injected at the 3 and 9:00 positions respectively. CO2 tenaculum was placed onto the anterior lip of the cervix. Uterus sounded to 8 cm. IUD inserted without difficulty. String visible and trimmed to 3 cm. Patient tolerated procedure well.  IUD Information: Mirena, Lot # [id number], Expiration date 08/20.  Condition: Stable  Complications: None  Plan:  The patient was advised to call for any fever or for prolonged or severe pain or bleeding. She was advised to use OTC acetaminophen and OTC ibuprofen as needed for mild to moderate pain.   Attending Physician Documentation: [name], MD   ASSESSMENT: 1. Endometriosis,Stable 2. New diagnosis of migraine headaches 3. Desires Mirena IUD after consideration of all options for contraception  PLAN: 1. Paracervical block 2. Mirena IUD insertion 3. Return in 4 weeks for IUD string check  A total of 25 minutes were spent face-to-face with the patient during this encounter and over half of that time involved counseling and coordination of care.  [name], MD  Note: This dictation was prepared with Dragon dictation along with smaller phrase technology. Any transcriptional errors that result from this process are unintentional.

## 2016-04-20 ENCOUNTER — Ambulatory Visit (INDEPENDENT_AMBULATORY_CARE_PROVIDER_SITE_OTHER): Payer: BLUE CROSS/BLUE SHIELD | Admitting: Obstetrics and Gynecology

## 2016-04-20 ENCOUNTER — Encounter: Payer: Self-pay | Admitting: Obstetrics and Gynecology

## 2016-04-20 VITALS — BP 91/61 | HR 54 | Ht 66.0 in | Wt 136.2 lb

## 2016-04-20 DIAGNOSIS — Z30431 Encounter for routine checking of intrauterine contraceptive device: Secondary | ICD-10-CM

## 2016-04-20 DIAGNOSIS — N809 Endometriosis, unspecified: Secondary | ICD-10-CM | POA: Diagnosis not present

## 2016-04-20 DIAGNOSIS — R102 Pelvic and perineal pain: Secondary | ICD-10-CM

## 2016-04-20 NOTE — Progress Notes (Signed)
Chief complaint: 1. IUD string check 2. Follow-up on pelvic pain 3. History of endometriosis  Patient presents one month after Mirena IUD insertion for checkup. Patient has mild chronic spotting. No significant exacerbation of pelvic pain. No untoward side effects from the IUD have been noted.  OBJECTIVE: BP 91/61   Pulse (!) 54   Ht 5' 6"  (1.676 m)   Wt 136 lb 3.2 oz (61.8 kg)   LMP 04/12/2016 (Approximate)   BMI 21.98 kg/m  Pleasant female in no distress. Oriented. Abdomen: Soft, tender on pelvic: External genitalia normal BUS-vagina-minimal brown discharge/blood Cervix-IUD string 2 cm; no cervical motion tenderness Uterus-midplane to retroverted, nontender, normal size and shape Adnexa-nonpalpable and nontender Rectovaginal-normal external exam  ASSESSMENT: 1. Normal IUD string check 2. Endometriosis, stable 3. Spotting status post Mirena IUD insertion  PLAN: 1. Maintain menstrual calendar monitoring 2. Follow-up in August 2018 for annual exam as scheduled  [name], MD  Note: This dictation was prepared with Dragon dictation along with smaller phrase technology. Any transcriptional errors that result from this process are unintentional.

## 2016-04-20 NOTE — Patient Instructions (Signed)
1. Return in August 2018 for annual exam 2. Continue monitoring abnormal uterine bleeding

## 2016-05-17 DIAGNOSIS — G43009 Migraine without aura, not intractable, without status migrainosus: Secondary | ICD-10-CM | POA: Insufficient documentation

## 2016-05-17 DIAGNOSIS — J302 Other seasonal allergic rhinitis: Secondary | ICD-10-CM | POA: Insufficient documentation

## 2016-09-15 ENCOUNTER — Encounter: Payer: BLUE CROSS/BLUE SHIELD | Admitting: Obstetrics and Gynecology

## 2016-09-16 NOTE — Progress Notes (Signed)
ANNUAL PREVENTATIVE CARE GYN  ENCOUNTER NOTE  Subjective:       Bethany Harrison is a 31 y.o. G0P0 female here for a routine annual gynecologic exam.  Current complaints: 1.  Last 2 months pain with ovulation   Mirena IUD was placed in March 2018. She is having regular cycles, but light. Over the past 2 months she has noted ovulation pain on the right side. These discomforts typically last for a few hours and occur midcycle. The patient does have lessened dysmenorrhea and persistent dyspareunia (less severe than prior to IUD insertion)  Patient has long history of symptomatic endometriosis and interstitial cystitis. Summary of medical history:  Patient has long history of symptomatic endometriosis, form of chronic pelvic pain and unstable bladder symptoms. She was initially diagnosed with endometriosis in 2009 through laparoscopy with peritoneal biopsies.  I performed the procedure.  At that time which demonstrated endometriosis on pathology in biopsies of the cul-de-sac, left uterosacral ligament, and bladder.  She has since completed a six-month course of Lupron therapy shortly after diagnosis.  She has been treated with multiple agents in the past including Depo-Provera, various OCP regimens, which seemed ineffective, and most recently the Mirena IUD; the IUD was taken out in the past 6 months. The patient has undergone hysteroscopy/D&C in the past without definitive pathology being identified; this did not regulate her heavy menstrual cycles. The patient also has been seen at [facility] in there.  Chronic pain clinic and had repeat laparoscopy done; note is unavailable for review.  Findings at that time. Patient has most recently been at [facility] where discussion of possible hysterectomy ensued.  Further discussion with the patient at her last visit demonstrates that she is interested in possibly attempting conception to have children.  Prior to definitive surgical management. Patient has  had interval laparoscopy inJune 2016 with laparoscopy showing endometriosis type lesions in the cul-de-sac; biopsies showed benign fibroadenomatous tissue without obvious endometriosis;, her Mirena IUD was removed at that time; Chromopertubation of the fallopian tubes demonstrated the tubes to be patent..  The patient does have significant unstable bladder symptoms in the form of dysuria, intermittent frequency, and a worsening of symptoms just prior to menses.  She does not have nocturia or urgency.  She does not have chronic UTIs.  Evaluation by urology has not demonstrated any endometriosis within the bladder on cystoscopy.  The patient does note bowel movements, which tend to be worse just prior to menses onset.  She does have premenstrual tenesmus and has increased frequency of stools without evidence of blood per rectum or melena.  The patient's dysmenorrhea is typically central cramping with low back pain with radiation into the buttocks and left leg.  She also is experiencing deep thrusting dyspareunia.   Gynecologic History lmp 8/20/018 Contraception:iud mirena 03/2016 Last Pap: 08/2014 neg/neg. Results were: normal Last mammogram: n/a. Results were:   Obstetric History OB History  Gravida Para Term Preterm AB Living  0            SAB TAB Ectopic Multiple Live Births                   Past Medical History:  Diagnosis Date  . Abdominal pain   . Endometriosis   . GERD (gastroesophageal reflux disease)   . Headache   . Hemorrhoid   . IC (interstitial cystitis)   . Kidney stone   . Ulcerative colitis [facility])     Past Surgical History:  Procedure  Laterality Date  . APPENDECTOMY  2013   with laparoscopy @ [facility]  . BUNIONECTOMY  08, 10  . CHROMOPERTUBATION N/A 06/30/2014   Procedure: CHROMOPERTUBATION;  Surgeon: [name], MD;  Location: [facility] ORS;  Service: Gynecology;  Laterality: N/A;  . COLONOSCOPY  2014  . DIAGNOSTIC LAPAROSCOPY    . DILATION AND  CURETTAGE OF UTERUS  2013   polyp  . HERNIA REPAIR  87   umbilical  . INTRAUTERINE DEVICE INSERTION  06/24/2011   Mirena IUD  . IUD REMOVAL N/A 06/30/2014   Procedure: INTRAUTERINE DEVICE (IUD) REMOVAL;  Surgeon: [name], MD;  Location: [facility] ORS;  Service: Gynecology;  Laterality: N/A;  . LAPAROSCOPY N/A 06/30/2014   Procedure:  with excision of endoemtriosis and biopsy;  Surgeon: [name], MD;  Location: [facility] ORS;  Service: Gynecology;  Laterality: N/A;  . PELVIC LAPAROSCOPY  2009   times 2  . TONSILLECTOMY    . TONSILLECTOMY      Current Outpatient Prescriptions on File Prior to Visit  Medication Sig Dispense Refill  . butalbital-acetaminophen-caffeine (FIORICET, ESGIC) 50-325-40 MG per tablet Take 1 tablet by mouth daily as needed for headache.     . levonorgestrel (MIRENA) 20 MCG/24HR IUD 1 each by Intrauterine route once.    . montelukast (SINGULAIR) 10 MG tablet Take 10 mg by mouth at bedtime.    . topiramate (TOPAMAX) 25 MG tablet Take 25 mg by mouth 2 (two) times daily.     No current facility-administered medications on file prior to visit.     Allergies  Allergen Reactions  . Sulfa Antibiotics Nausea Only    Social History   Social History  . Marital status: Single    Spouse name: N/A  . Number of children: N/A  . Years of education: N/A   Occupational History  . [employer]   Social History Main Topics  . Smoking status: Never Smoker  . Smokeless tobacco: Never Used  . Alcohol use 0.0 oz/week     Comment: 3 a month  . Drug use: No  . Sexual activity: Yes    Partners: Male    Birth control/ protection: IUD   Other Topics Concern  . Not on file   Social History Narrative  . No narrative on file    Family History  Problem Relation Age of Onset  . Breast cancer Mother 61  . Cancer Paternal Grandmother        colon  . Diabetes Paternal Grandmother   . Heart disease Neg Hx   . Ovarian cancer Neg Hx     The  following portions of the patient's history were reviewed and updated as appropriate: allergies, current medications, past family history, past medical history, past social history, past surgical history and problem list.  Review of Systems Review of Systems  Constitutional: Negative.   HENT: Negative.   Eyes: Negative.   Respiratory: Negative.   Cardiovascular: Negative.   Gastrointestinal:       Mild discomfort with bowel movements just prior to menses  Genitourinary:       Mild irritative voiding symptoms just prior to menses  Musculoskeletal: Negative.   Skin: Negative.   Neurological: Negative.   Endo/Heme/Allergies: Negative.   Psychiatric/Behavioral: Negative.     Objective:   BP 95/60   Pulse 79   Ht 5' 6"  (1.676 m)   Wt 128 lb 11.2 oz (58.4 kg)   LMP 09/05/2016 (Approximate)   BMI 20.77  kg/m  CONSTITUTIONAL: Well-developed, well-nourished female in no acute distress.  PSYCHIATRIC: Normal mood and affect. Normal behavior. Normal judgment and thought content. [place]: Alert and oriented to person, place, and time. Normal muscle tone coordination. No cranial nerve deficit noted. HENT:  Normocephalic, atraumatic, External right and left ear normal. Oropharynx is clear and moist EYES: Conjunctivae and EOM are normal. No scleral icterus.  NECK: Normal range of motion, supple, no masses.  Normal thyroid.  SKIN: Skin is warm and dry. No rash noted. Not diaphoretic. No erythema. No pallor. CARDIOVASCULAR: Normal heart rate noted, regular rhythm, no murmur. RESPIRATORY: Clear to auscultation bilaterally. Effort and breath sounds normal, no problems with respiration noted. BREASTS: Symmetric in size. No masses, skin changes, nipple drainage, or lymphadenopathy. ABDOMEN: Soft, normal bowel sounds, no distention noted.  No tenderness, rebound or guarding.  BLADDER: Normal PELVIC:  External Genitalia: Normal  BUS: Normal  Vagina: Normal  Cervix: Normal; mild cervical motion  tenderness 1/4; IUD strings 2 cm long  Uterus: Normal; midplane to retroverted; mildly tender 1/4; mobile  Adnexa: Normal; nonpalpable and nontender  RV: External Exam NormaI  MUSCULOSKELETAL: Normal range of motion. No tenderness.  No cyanosis, clubbing, or edema.  2+ distal pulses. LYMPHATIC: No Axillary, Supraclavicular, or Inguinal Adenopathy.    Assessment:   Annual gynecologic examination 31 y.o. Contraception:iud Normal BMI Problem List Items Addressed This Visit    Endometriosis   Hx of migraine headaches    Other Visit Diagnoses    Well woman exam with routine gynecological exam    -  Primary   Encounter for routine checking of intrauterine contraceptive device (IUD)        Endometriosis stable. Patient is an LAVH candidate if hysterectomy is desired  Plan:  Pap: due 08/2017 Mammogram: due age 37 Stool Guaiac Testing:  Not Indicated Labs: thru pcp Routine preventative health maintenance measures emphasized: Exercise/Diet/Weight control, Tobacco Warnings, Alcohol/Substance use risks, Stress Management and Peer Pressure Issues Patient is to contact us if definitive surgery for hysterectomy is desired. LAVH RSO and left salpingectomy is recommended if procedure is to be completed. Contraception-Mirena IUD Return to [place], [place]  [name], MD   Note: This dictation was prepared with Dragon dictation along with smaller phrase technology. Any transcriptional errors that result from this process are unintentional.

## 2016-09-22 ENCOUNTER — Ambulatory Visit (INDEPENDENT_AMBULATORY_CARE_PROVIDER_SITE_OTHER): Payer: BC Managed Care – PPO | Admitting: Obstetrics and Gynecology

## 2016-09-22 ENCOUNTER — Encounter: Payer: Self-pay | Admitting: Obstetrics and Gynecology

## 2016-09-22 VITALS — BP 95/60 | HR 79 | Ht 66.0 in | Wt 128.7 lb

## 2016-09-22 DIAGNOSIS — Z30431 Encounter for routine checking of intrauterine contraceptive device: Secondary | ICD-10-CM

## 2016-09-22 DIAGNOSIS — Z01419 Encounter for gynecological examination (general) (routine) without abnormal findings: Secondary | ICD-10-CM | POA: Diagnosis not present

## 2016-09-22 DIAGNOSIS — N809 Endometriosis, unspecified: Secondary | ICD-10-CM

## 2016-09-22 DIAGNOSIS — N940 Mittelschmerz: Secondary | ICD-10-CM | POA: Diagnosis not present

## 2016-09-22 DIAGNOSIS — Z8669 Personal history of other diseases of the nervous system and sense organs: Secondary | ICD-10-CM | POA: Diagnosis not present

## 2016-09-22 NOTE — Patient Instructions (Addendum)
1. Pap smear is due in 2019 2. Self breast awareness is encouraged 3. Contraception-IUD 4. Screening labs are to be done through primary care 5. Continue with healthy eating and exercise 6. Return in 1 year for annual exam  Health Maintenance, Female Adopting a healthy lifestyle and getting preventive care can go a long way to promote health and wellness. Talk with your health care provider about what schedule of regular examinations is right for you. This is a good chance for you to check in with your provider about disease prevention and staying healthy. In between checkups, there are plenty of things you can do on your own. Experts have done a lot of research about which lifestyle changes and preventive measures are most likely to keep you healthy. Ask your health care provider for more information. Weight and diet Eat a healthy diet  Be sure to include plenty of vegetables, fruits, low-fat dairy products, and lean protein.  Do not eat a lot of foods high in solid fats, added sugars, or salt.  Get regular exercise. This is one of the most important things you can do for your health. ? Most adults should exercise for at least 150 minutes each week. The exercise should increase your heart rate and make you sweat (moderate-intensity exercise). ? Most adults should also do strengthening exercises at least twice a week. This is in addition to the moderate-intensity exercise.  Maintain a healthy weight  Body mass index (BMI) is a measurement that can be used to identify possible weight problems. It estimates body fat based on height and weight. Your health care provider can help determine your BMI and help you achieve or maintain a healthy weight.  For females 51 years of age and older: ? A BMI below 18.5 is considered underweight. ? A BMI of 18.5 to 24.9 is normal. ? A BMI of 25 to 29.9 is considered overweight. ? A BMI of 30 and above is considered obese.  Watch levels of cholesterol  and blood lipids  You should start having your blood tested for lipids and cholesterol at 33 years of age, then have this test every 5 years.  You may need to have your cholesterol levels checked more often if: ? Your lipid or cholesterol levels are high. ? You are older than 33 years of age. ? You are at high risk for heart disease.  Cancer screening Lung Cancer  Lung cancer screening is recommended for adults 39-69 years old who are at high risk for lung cancer because of a history of smoking.  A yearly low-dose CT scan of the lungs is recommended for people who: ? Currently smoke. ? Have quit within the past 15 years. ? Have at least a 30-pack-year history of smoking. A pack year is smoking an average of one pack of cigarettes a day for 1 year.  Yearly screening should continue until it has been 15 years since you quit.  Yearly screening should stop if you develop a health problem that would prevent you from having lung cancer treatment.  Breast Cancer  Practice breast self-awareness. This means understanding how your breasts normally appear and feel.  It also means doing regular breast self-exams. Let your health care provider know about any changes, no matter how small.  If you are in your 20s or 30s, you should have a clinical breast exam (CBE) by a health care provider every 1-3 years as part of a regular health exam.  If you are 40 or  older, have a CBE every year. Also consider having a breast X-ray (mammogram) every year.  If you have a family history of breast cancer, talk to your health care provider about genetic screening.  If you are at high risk for breast cancer, talk to your health care provider about having an MRI and a mammogram every year.  Breast cancer gene (BRCA) assessment is recommended for women who have family members with BRCA-related cancers. BRCA-related cancers include: ? Breast. ? Ovarian. ? Tubal. ? Peritoneal cancers.  Results of the  assessment will determine the need for genetic counseling and BRCA1 and BRCA2 testing.  Cervical Cancer Your health care provider may recommend that you be screened regularly for cancer of the pelvic organs (ovaries, uterus, and vagina). This screening involves a pelvic examination, including checking for microscopic changes to the surface of your cervix (Pap test). You may be encouraged to have this screening done every 3 years, beginning at age 51.  For women ages 18-65, health care providers may recommend pelvic exams and Pap testing every 3 years, or they may recommend the Pap and pelvic exam, combined with testing for human papilloma virus (HPV), every 5 years. Some types of HPV increase your risk of cervical cancer. Testing for HPV may also be done on women of any age with unclear Pap test results.  Other health care providers may not recommend any screening for nonpregnant women who are considered low risk for pelvic cancer and who do not have symptoms. Ask your health care provider if a screening pelvic exam is right for you.  If you have had past treatment for cervical cancer or a condition that could lead to cancer, you need Pap tests and screening for cancer for at least 20 years after your treatment. If Pap tests have been discontinued, your risk factors (such as having a new sexual partner) need to be reassessed to determine if screening should resume. Some women have medical problems that increase the chance of getting cervical cancer. In these cases, your health care provider may recommend more frequent screening and Pap tests.  Colorectal Cancer  This type of cancer can be detected and often prevented.  Routine colorectal cancer screening usually begins at 33 years of age and continues through 33 years of age.  Your health care provider may recommend screening at an earlier age if you have risk factors for colon cancer.  Your health care provider may also recommend using home test  kits to check for hidden blood in the stool.  A small camera at the end of a tube can be used to examine your colon directly (sigmoidoscopy or colonoscopy). This is done to check for the earliest forms of colorectal cancer.  Routine screening usually begins at age 57.  Direct examination of the colon should be repeated every 5-10 years through 33 years of age. However, you may need to be screened more often if early forms of precancerous polyps or small growths are found.  Skin Cancer  Check your skin from head to toe regularly.  Tell your health care provider about any new moles or changes in moles, especially if there is a change in a mole's shape or color.  Also tell your health care provider if you have a mole that is larger than the size of a pencil eraser.  Always use sunscreen. Apply sunscreen liberally and repeatedly throughout the day.  Protect yourself by wearing long sleeves, pants, a wide-brimmed hat, and sunglasses whenever you are outside.  Heart disease, diabetes, and high blood pressure  High blood pressure causes heart disease and increases the risk of stroke. High blood pressure is more likely to develop in: ? People who have blood pressure in the high end of the normal range (130-139/85-89 mm Hg). ? People who are overweight or obese. ? People who are African American.  If you are 15-77 years of age, have your blood pressure checked every 3-5 years. If you are 53 years of age or older, have your blood pressure checked every year. You should have your blood pressure measured twice-once when you are at a hospital or clinic, and once when you are not at a hospital or clinic. Record the average of the two measurements. To check your blood pressure when you are not at a hospital or clinic, you can use: ? An automated blood pressure machine at a pharmacy. ? A home blood pressure monitor.  If you are between 32 years and 53 years old, ask your health care provider if you  should take aspirin to prevent strokes.  Have regular diabetes screenings. This involves taking a blood sample to check your fasting blood sugar level. ? If you are at a normal weight and have a low risk for diabetes, have this test once every three years after 33 years of age. ? If you are overweight and have a high risk for diabetes, consider being tested at a younger age or more often. Preventing infection Hepatitis B  If you have a higher risk for hepatitis B, you should be screened for this virus. You are considered at high risk for hepatitis B if: ? You were born in a country where hepatitis B is common. Ask your health care provider which countries are considered high risk. ? Your parents were born in a high-risk country, and you have not been immunized against hepatitis B (hepatitis B vaccine). ? You have HIV or AIDS. ? You use needles to inject street drugs. ? You live with someone who has hepatitis B. ? You have had sex with someone who has hepatitis B. ? You get hemodialysis treatment. ? You take certain medicines for conditions, including cancer, organ transplantation, and autoimmune conditions.  Hepatitis C  Blood testing is recommended for: ? Everyone born from [birth year] through 1967. ? Anyone with known risk factors for hepatitis C.  Sexually transmitted infections (STIs)  You should be screened for sexually transmitted infections (STIs) including gonorrhea and chlamydia if: ? You are sexually active and are younger than 33 years of age. ? You are older than 33 years of age and your health care provider tells you that you are at risk for this type of infection. ? Your sexual activity has changed since you were last screened and you are at an increased risk for chlamydia or gonorrhea. Ask your health care provider if you are at risk.  If you do not have HIV, but are at risk, it may be recommended that you take a prescription medicine daily to prevent HIV infection. This is  called pre-exposure prophylaxis (PrEP). You are considered at risk if: ? You are sexually active and do not regularly use condoms or know the HIV status of your partner(s). ? You take drugs by injection. ? You are sexually active with a partner who has HIV.  Talk with your health care provider about whether you are at high risk of being infected with HIV. If you choose to begin PrEP, you should first be tested for HIV.  You should then be tested every 3 months for as long as you are taking PrEP. Pregnancy  If you are premenopausal and you may become pregnant, ask your health care provider about preconception counseling.  If you may become pregnant, take 400 to 800 micrograms (mcg) of folic acid every day.  If you want to prevent pregnancy, talk to your health care provider about birth control (contraception). Osteoporosis and menopause  Osteoporosis is a disease in which the bones lose minerals and strength with aging. This can result in serious bone fractures. Your risk for osteoporosis can be identified using a bone density scan.  If you are 26 years of age or older, or if you are at risk for osteoporosis and fractures, ask your health care provider if you should be screened.  Ask your health care provider whether you should take a calcium or vitamin D supplement to lower your risk for osteoporosis.  Menopause may have certain physical symptoms and risks.  Hormone replacement therapy may reduce some of these symptoms and risks. Talk to your health care provider about whether hormone replacement therapy is right for you. Follow these instructions at home:  Schedule regular health, dental, and eye exams.  Stay current with your immunizations.  Do not use any tobacco products including cigarettes, chewing tobacco, or electronic cigarettes.  If you are pregnant, do not drink alcohol.  If you are breastfeeding, limit how much and how often you drink alcohol.  Limit alcohol intake to  no more than 1 drink per day for nonpregnant women. One drink equals 12 ounces of beer, 5 ounces of wine, or 1 ounces of hard liquor.  Do not use street drugs.  Do not share needles.  Ask your health care provider for help if you need support or information about quitting drugs.  Tell your health care provider if you often feel depressed.  Tell your health care provider if you have ever been abused or do not feel safe at home. This information is not intended to replace advice given to you by your health care provider. Make sure you discuss any questions you have with your health care provider. Document Released: 07/19/2010 Document Revised: 06/11/2015 Document Reviewed: 10/07/2014 Elsevier Interactive Patient Education  [employer].

## 2016-10-27 ENCOUNTER — Encounter: Payer: Self-pay | Admitting: Family Medicine

## 2016-10-27 ENCOUNTER — Ambulatory Visit (INDEPENDENT_AMBULATORY_CARE_PROVIDER_SITE_OTHER): Payer: BC Managed Care – PPO | Admitting: Family Medicine

## 2016-10-27 VITALS — BP 100/62 | HR 80 | Ht 67.0 in | Wt 128.0 lb

## 2016-10-27 DIAGNOSIS — G43709 Chronic migraine without aura, not intractable, without status migrainosus: Secondary | ICD-10-CM

## 2016-10-27 DIAGNOSIS — Z7689 Persons encountering health services in other specified circumstances: Secondary | ICD-10-CM

## 2016-10-27 NOTE — Progress Notes (Signed)
Name: Bethany Harrison   MRN: [id number]    DOB: Feb 02, 1985   Date:10/27/2016       Progress Note  Subjective  Chief Complaint  Chief Complaint  Patient presents with  . Establish Care    moving from [facility]    Patient presents establishment of care.     Migraine   This is a recurrent problem. Episode onset: for eval of recurrent headache. The problem occurs monthly. The pain is located in the left unilateral and retro-orbital (recent right event) region. The pain does not radiate. The pain quality is similar to prior headaches. The quality of the pain is described as throbbing. The pain is at a severity of 7/10. The pain is moderate. Associated symptoms include eye pain, eye watering, nausea, photophobia, scalp tenderness and sinus pressure. Pertinent negatives include no abdominal pain, back pain, blurred vision, coughing, dizziness, ear pain, facial sweating, fever, hearing loss, insomnia, loss of balance, neck pain, numbness, phonophobia, rhinorrhea, seizures, sore throat, tingling, tinnitus, visual change, weakness or weight loss. Associated symptoms comments: olefactory abn/"foggy brain". The symptoms are aggravated by menstrual cycle (endometiosis/interstial ?). She has tried triptans (topamax) for the symptoms. The treatment provided mild relief. Her past medical history is significant for migraine headaches.    No problem-specific Assessment & Plan notes found for this encounter.   Past Medical History:  Diagnosis Date  . Abdominal pain   . Anxiety   . Depression   . Endometriosis   . GERD (gastroesophageal reflux disease)   . Headache   . Hemorrhoid   . IC (interstitial cystitis)   . Kidney stone   . Ulcerative colitis [facility])     Past Surgical History:  Procedure Laterality Date  . APPENDECTOMY  2013   with laparoscopy @ [facility]  . BUNIONECTOMY  08, 10  . CHROMOPERTUBATION N/A 06/30/2014   Procedure: CHROMOPERTUBATION;  Surgeon: [name], MD;   Location: [facility] ORS;  Service: Gynecology;  Laterality: N/A;  . COLONOSCOPY  2014  . DIAGNOSTIC LAPAROSCOPY    . DILATION AND CURETTAGE OF UTERUS  2013   polyp  . HERNIA REPAIR  87   umbilical  . INTRAUTERINE DEVICE INSERTION  06/24/2011   Mirena IUD  . IUD REMOVAL N/A 06/30/2014   Procedure: INTRAUTERINE DEVICE (IUD) REMOVAL;  Surgeon: [name], MD;  Location: [facility] ORS;  Service: Gynecology;  Laterality: N/A;  . LAPAROSCOPY N/A 06/30/2014   Procedure:  with excision of endoemtriosis and biopsy;  Surgeon: [name], MD;  Location: [facility] ORS;  Service: Gynecology;  Laterality: N/A;  . PELVIC LAPAROSCOPY  2009   times 2  . TONSILLECTOMY    . TONSILLECTOMY      Family History  Problem Relation Age of Onset  . Breast cancer Mother 6  . Cancer Mother   . Diabetes Mother   . Cancer Paternal Grandmother        colon  . Diabetes Paternal Grandmother   . Diabetes Maternal Grandmother   . Heart disease Neg Hx   . Ovarian cancer Neg Hx     Social History   Social History  . Marital status: Single    Spouse name: N/A  . Number of children: N/A  . Years of education: N/A   Occupational History  . [employer]   Social History Main Topics  . Smoking status: Never Smoker  . Smokeless tobacco: Never Used  . Alcohol use 0.0 oz/week  Comment: occas  . Drug use: No  . Sexual activity: Yes    Partners: Male    Birth control/ protection: IUD   Other Topics Concern  . Not on file   Social History Narrative  . No narrative on file    Allergies  Allergen Reactions  . Sulfa Antibiotics Nausea Only    Outpatient Medications Prior to Visit  Medication Sig Dispense Refill  . butalbital-acetaminophen-caffeine (FIORICET, ESGIC) 50-325-40 MG per tablet Take 1 tablet by mouth daily as needed for headache. GYN    . levonorgestrel (MIRENA) 20 MCG/24HR IUD 1 each by Intrauterine route once.    . montelukast (SINGULAIR) 10 MG tablet Take 10 mg by mouth  at bedtime.    . topiramate (TOPAMAX) 25 MG tablet Take 25 mg by mouth 3 (three) times daily. 1 in the am and 2 in pm     No facility-administered medications prior to visit.     Review of Systems  Constitutional: Negative for chills, fever, malaise/fatigue and weight loss.  HENT: Positive for sinus pressure. Negative for ear discharge, ear pain, hearing loss, rhinorrhea, sore throat and tinnitus.   Eyes: Positive for photophobia and pain. Negative for blurred vision.  Respiratory: Negative for cough, sputum production, shortness of breath and wheezing.   Cardiovascular: Negative for chest pain, palpitations and leg swelling.  Gastrointestinal: Positive for nausea. Negative for abdominal pain, blood in stool, constipation, diarrhea, heartburn and melena.  Genitourinary: Negative for dysuria, frequency, hematuria and urgency.  Musculoskeletal: Negative for back pain, joint pain, myalgias and neck pain.  Skin: Negative for rash.  Neurological: Positive for headaches. Negative for dizziness, tingling, tremors, sensory change, speech change, focal weakness, seizures, loss of consciousness, weakness, numbness and loss of balance.  Endo/Heme/Allergies: Negative for environmental allergies and polydipsia. Does not bruise/bleed easily.  Psychiatric/Behavioral: Negative for depression and suicidal ideas. The patient is not nervous/anxious and does not have insomnia.      Objective  Vitals:   10/27/16 1443  BP: 100/62  Pulse: 80  Weight: 128 lb (58.1 kg)  Height: 5\' 7"  (1.702 m)    Physical Exam  Constitutional: She is well-developed, well-nourished, and in no distress. No distress.  HENT:  Head: Normocephalic and atraumatic.  Right Ear: Tympanic membrane and external ear normal.  Left Ear: Tympanic membrane and external ear normal.  Nose: Nose normal.  Mouth/Throat: Oropharynx is clear and moist. No oropharyngeal exudate, posterior oropharyngeal edema or posterior oropharyngeal  erythema.  Eyes: Right eye visual fields normal and left eye visual fields normal. Pupils are equal, round, and reactive to light. Conjunctivae and EOM are normal. Right eye exhibits no discharge. Left eye exhibits no discharge.  Fundoscopic exam:      The right eye shows no papilledema.       The left eye shows no papilledema.  Neck: Normal range of motion. Neck supple. Normal carotid pulses, no hepatojugular reflux and no JVD present. Carotid bruit is not present. No thyromegaly present.  Cardiovascular: Normal rate, regular rhythm, normal heart sounds and intact distal pulses.  Exam reveals no gallop and no friction rub.   No murmur heard. Pulmonary/Chest: Effort normal and breath sounds normal. She has no wheezes. She has no rales.  Abdominal: Soft. Bowel sounds are normal. She exhibits no mass. There is no tenderness. There is no guarding.  Musculoskeletal: Normal range of motion. She exhibits no edema.  Lymphadenopathy:    She has no cervical adenopathy.  Neurological: She is alert. She has normal  sensation, normal strength, normal reflexes and intact cranial nerves. She has a normal Romberg Test.  Skin: Skin is warm and dry. She is not diaphoretic.  Psychiatric: Mood and affect normal.  Nursing note and vitals reviewed.     Assessment & Plan  Problem List Items Addressed This Visit    None    Visit Diagnoses    Establishing care with new doctor, encounter for    -  Primary   Chronic migraine without aura without status migrainosus, not intractable       mensrual trigger   Relevant Medications   SUMAtriptan (IMITREX) 50 MG tablet   Other Relevant Orders   Ambulatory referral to Neurology      No orders of the defined types were placed in this encounter.     Dr. Macon [facility]  10/27/16

## 2017-02-02 DIAGNOSIS — R519 Headache, unspecified: Secondary | ICD-10-CM | POA: Insufficient documentation

## 2017-02-14 ENCOUNTER — Other Ambulatory Visit: Payer: Self-pay | Admitting: Acute Care

## 2017-02-14 DIAGNOSIS — R519 Headache, unspecified: Secondary | ICD-10-CM

## 2017-02-14 DIAGNOSIS — R51 Headache: Principal | ICD-10-CM

## 2017-02-21 ENCOUNTER — Ambulatory Visit
Admission: RE | Admit: 2017-02-21 | Discharge: 2017-02-21 | Disposition: A | Discharge status: Home / Self Care | Payer: BC Managed Care – PPO | Source: Ambulatory Visit | Attending: Acute Care | Admitting: Acute Care

## 2017-02-21 DIAGNOSIS — R51 Headache: Secondary | ICD-10-CM | POA: Insufficient documentation

## 2017-02-21 DIAGNOSIS — R519 Headache, unspecified: Secondary | ICD-10-CM

## 2017-03-10 IMAGING — US US BREAST LTD UNI RIGHT INC AXILLA
1 series · 7 of 7 positions shown · non-contrast
Comparison: None.

CLINICAL DATA: 29-year-old female presenting for evaluation of a
palpable area of concern in the inferior right breast. Two weeks
ago, a known sebaceous cyst at this location become markedly
increased in size and more painful. A small amount of material could
be expressed. The patient has been on a course of doxycycline, with
marked improvement.

EXAM:
ULTRASOUND OF THE RIGHT BREAST

[Series 1: us breast ltd uni right inc axilla · 0.03mm/px · 7 of 7 slices shown]
[im 1/7]
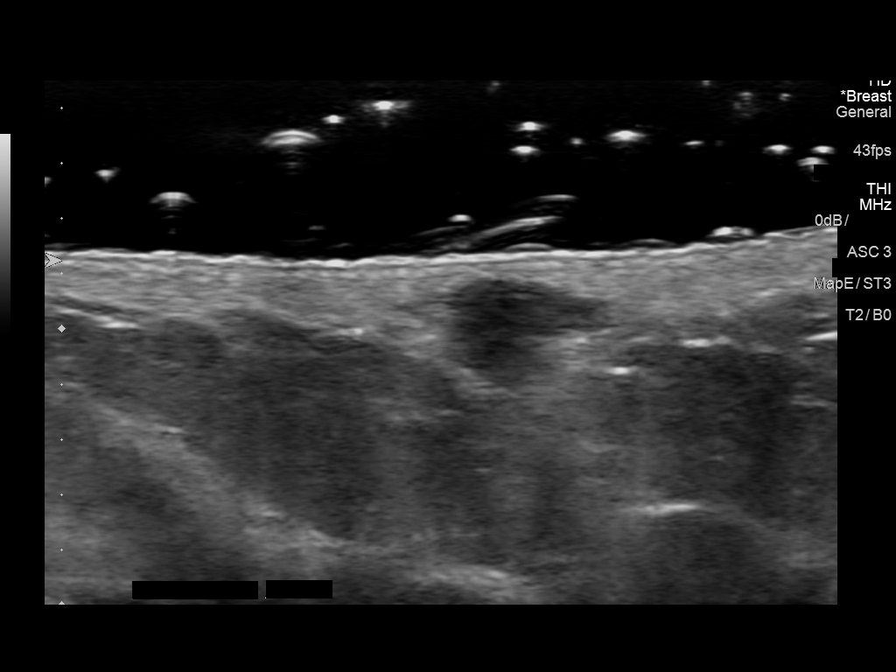
[im 2/7]
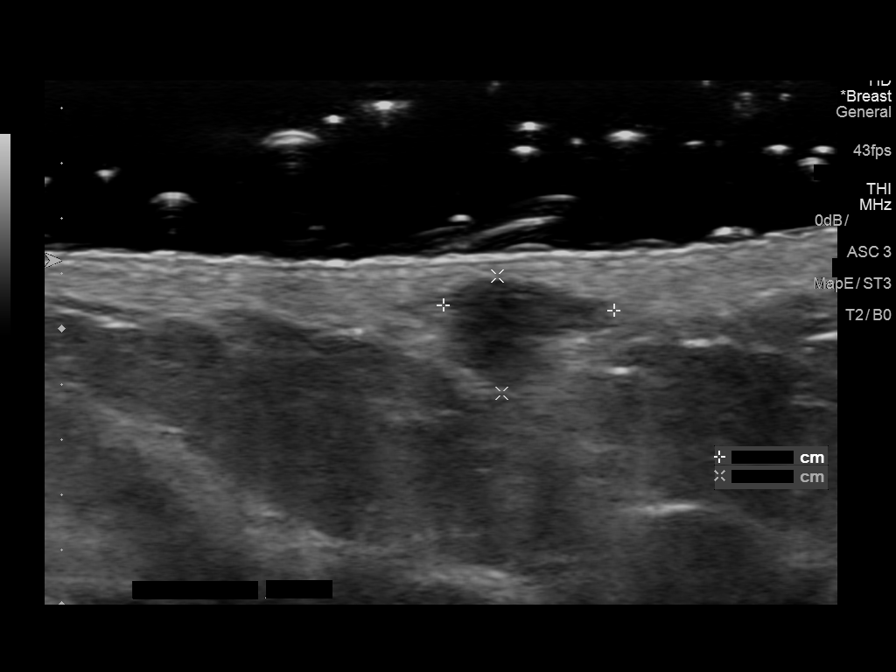
[im 3/7]
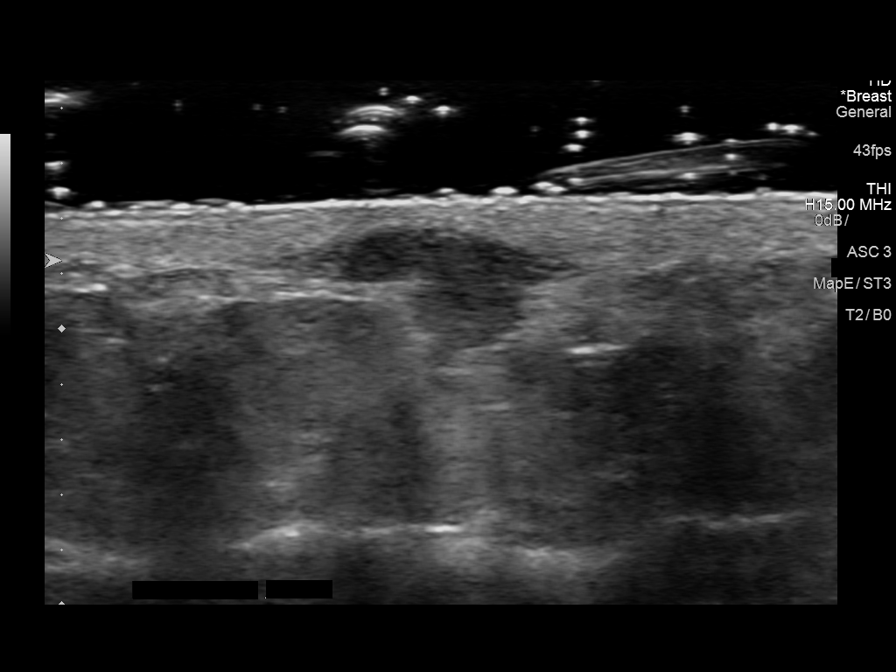
[im 4/7]
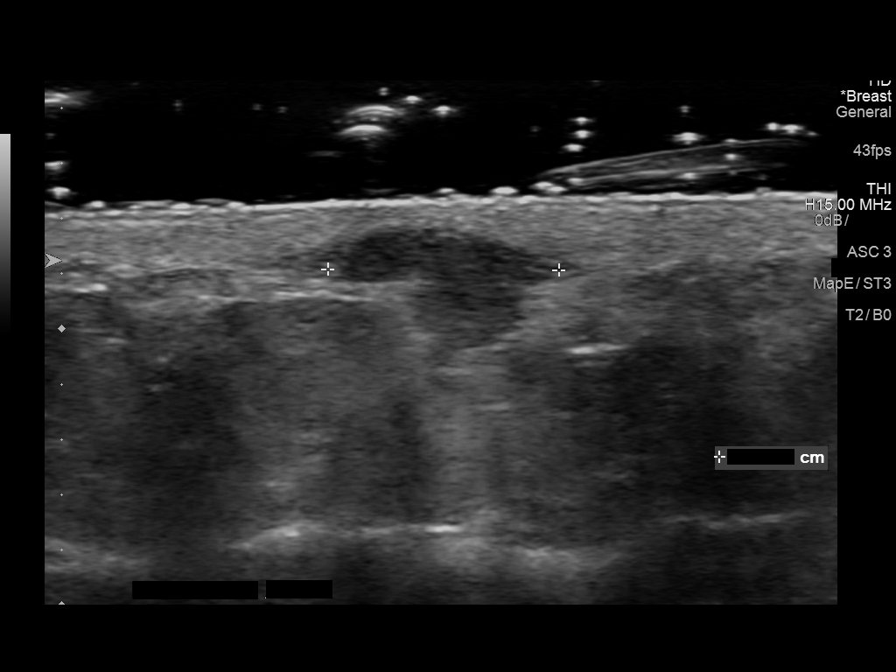
[im 5/7]
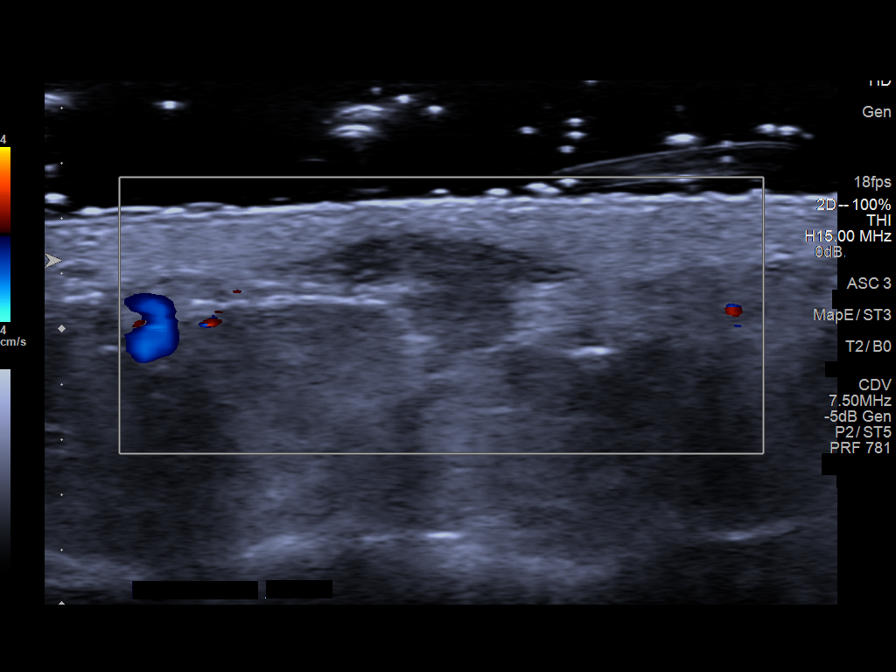
[im 6/7]
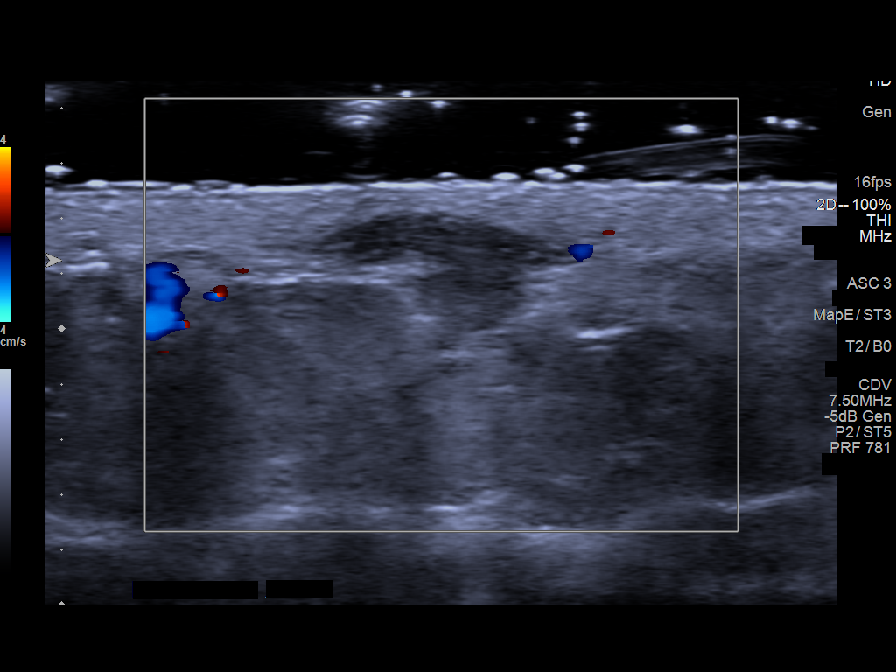
[im 7/7]
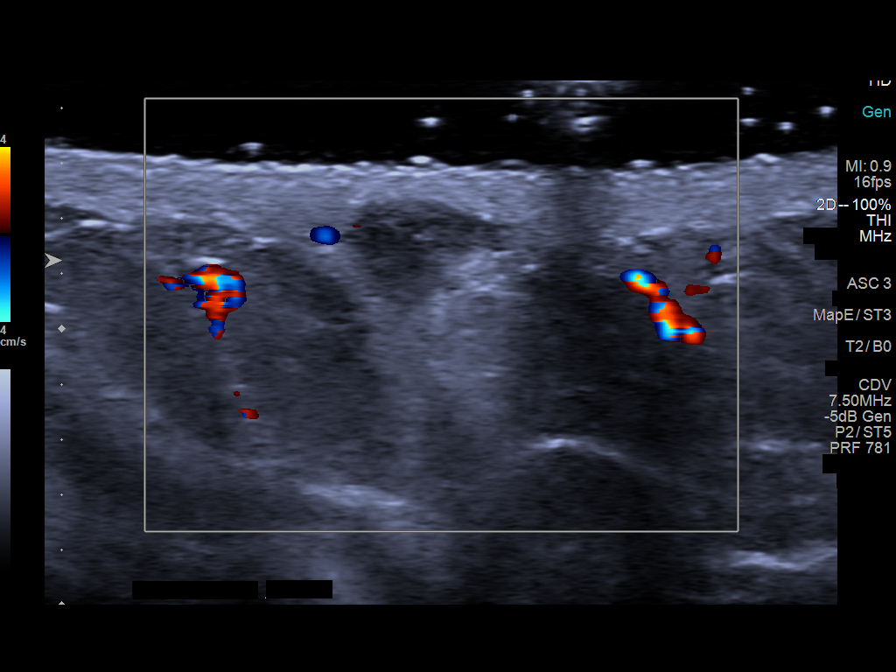

[7 of 7 positions shown; findings below may reference images not displayed]

FINDINGS: The patient declined a mammogram today, stating that she had a
mammogram last Monday August, 2014 (not available at this time for
correlation). The fat mammogram was performed for a palpable area
which was determined to be normal/benign by the patient's
description. The purpose for mammography was explained to the
patient including evaluation of findings not visible by ultrasound
(e.g. calcifications). She expressed understanding but preferred to
proceed with ultrasound only.

On physical exam, a small palpable superficial area is noted in the
inferior right breast near the inframammary fold at approximately 6
o'clock. A small dark skin pore is seen at this site.

Targeted ultrasound is performed, showing a hypoechoic mass within
the skin corresponding to the palpable area, compatible with a
benign sebaceous cyst. No abscess is identified.
IMPRESSION: 1. The palpable area of concern in the inferior right breast
corresponds with a benign sebaceous cyst.

RECOMMENDATION:
1. Clinical follow-up recommended for the improving superinfected
sebaceous cyst in the right breast.

2. Screening mammogram at age 40 unless there are persistent or
intervening clinical concerns. (Code:[id number]-V6Y)

I have discussed the findings and recommendations with the patient.
Results were also provided in writing at the conclusion of the
visit. If applicable, a reminder letter will be sent to the patient
regarding the next appointment.

BI-RADS CATEGORY  2: Benign Finding(s)

## 2017-09-27 ENCOUNTER — Encounter: Payer: BC Managed Care – PPO | Admitting: Obstetrics and Gynecology

## 2017-09-29 NOTE — Progress Notes (Signed)
ANNUAL PREVENTATIVE CARE GYN  ENCOUNTER NOTE  Subjective:       Bethany Harrison is a 32 y.o. G0P0 female here for a routine annual gynecologic exam.  Current complaints:  1.  no current complaints  Mirena IUD was placed in March 2018. She is having spotting only.  Patient has long history of symptomatic endometriosis and interstitial cystitis and ulcerative colitis.  Summary of medical history (updated 10/05/2017):  Patient has long history of symptomatic endometriosis, form of chronic pelvic pain and unstable bladder symptoms. She was initially diagnosed with endometriosis in 2009 through laparoscopy with peritoneal biopsies.  I performed the procedure.  At that time which demonstrated endometriosis on pathology in biopsies of the cul-de-sac, left uterosacral ligament, and bladder.  She has since completed a six-month course of Lupron therapy shortly after diagnosis.  She has been treated with multiple agents in the past including Depo-Provera, various OCP regimens, which seemed ineffective, and most recently the Mirena IUD; the IUD was taken out in the past 6 months. The patient has undergone hysteroscopy/D&C in the past without definitive pathology being identified; this did not regulate her heavy menstrual cycles. The patient also has been seen at [facility] in there [facility] and had repeat laparoscopy done; note is unavailable for review of findings at that time. Patient has most recently been at [facility] where discussion of possible hysterectomy ensued.    Currently she is not interested in possibly attempting conception to have children prior to definitive surgical management.  She is contemplating hysterectomy possibly next year in 2020 Patient has had interval laparoscopy in June 2016 with laparoscopy showing endometriosis type lesions in the cul-de-sac; biopsies showed benign fibroadenomatous tissue without obvious endometriosis;, her Mirena IUD was removed at that  time; Chromopertubation of the fallopian tubes demonstrated the tubes to be patent..  The patient does have significant unstable bladder symptoms in the form of dysuria, intermittent frequency, and a worsening of symptoms just prior to menses.  She does not have nocturia or urgency.  She does not have chronic UTIs.  Evaluation by urology has not demonstrated any endometriosis within the bladder on cystoscopy.  The patient does note bowel movements, which tend to be worse just prior to menses onset.  She does have premenstrual tenesmus and has increased frequency of stools without evidence of blood per rectum or melena.  Patient did have a colonoscopy this past week without evidence of active ulcerative colitis; however some biopsies were taken near the rectum-pathology is pending.  The patient's dysmenorrhea is typically central cramping with low back pain with radiation into the buttocks and left leg.  She continues to experiencedeep thrusting dyspareunia.   Gynecologic History lmp -spotting only with iud  Contraception:iud mirena 03/2016 Last Pap: 08/2014 neg/neg. Results were: normal Last mammogram: n/a. Results were:   Obstetric History OB History  Gravida Para Term Preterm AB Living  0            SAB TAB Ectopic Multiple Live Births               Past Medical History:  Diagnosis Date  . Abdominal pain   . Anxiety   . Depression   . Endometriosis   . GERD (gastroesophageal reflux disease)   . Headache   . Hemorrhoid   . IC (interstitial cystitis)   . Kidney stone   . Ulcerative colitis [facility])     Past Surgical History:  Procedure Laterality Date  . APPENDECTOMY  2013   with laparoscopy @ [facility]  . BUNIONECTOMY  08, 10  . CHROMOPERTUBATION N/A 06/30/2014   Procedure: CHROMOPERTUBATION;  Surgeon: [name], MD;  Location: [facility] ORS;  Service: Gynecology;  Laterality: N/A;  . COLONOSCOPY  2014  . DIAGNOSTIC LAPAROSCOPY    . DILATION AND CURETTAGE OF UTERUS   2013   polyp  . HERNIA REPAIR  87   umbilical  . INTRAUTERINE DEVICE INSERTION  06/24/2011   Mirena IUD  . IUD REMOVAL N/A 06/30/2014   Procedure: INTRAUTERINE DEVICE (IUD) REMOVAL;  Surgeon: [name], MD;  Location: [facility] ORS;  Service: Gynecology;  Laterality: N/A;  . LAPAROSCOPY N/A 06/30/2014   Procedure:  with excision of endoemtriosis and biopsy;  Surgeon: [name], MD;  Location: [facility] ORS;  Service: Gynecology;  Laterality: N/A;  . PELVIC LAPAROSCOPY  2009   times 2  . TONSILLECTOMY    . TONSILLECTOMY      Current Outpatient Medications on File Prior to Visit  Medication Sig Dispense Refill  . butalbital-acetaminophen-caffeine (FIORICET, ESGIC) 50-325-40 MG per tablet Take 1 tablet by mouth daily as needed for headache. GYN    . fluticasone (FLONASE) 50 MCG/ACT nasal spray Place 1 spray into both nostrils daily. ENT/ otc    . levonorgestrel (MIRENA) 20 MCG/24HR IUD 1 each by Intrauterine route once.    . montelukast (SINGULAIR) 10 MG tablet Take 10 mg by mouth at bedtime.    . promethazine (PHENERGAN) 25 MG tablet Take 25 mg by mouth as needed for nausea or vomiting. migraines    . SUMAtriptan (IMITREX) 50 MG tablet Take 50 mg by mouth as needed for migraine. May repeat in 2 hours if headache persists or recurs.    . topiramate (TOPAMAX) 25 MG tablet Take 25 mg by mouth 3 (three) times daily. 1 in the am and 2 in pm     No current facility-administered medications on file prior to visit.     Allergies  Allergen Reactions  . Sulfa Antibiotics Nausea Only    Social History   Socioeconomic History  . Marital status: Married    Spouse name: Not on file  . Number of children: Not on file  . Years of education: Not on file  . Highest education level: Not on file  Occupational History  . Occupation: HR    Employer: [employer]  Social Needs  . Financial resource strain: Not on file  . Food insecurity:    Worry: Not on file    Inability: Not  on file  . Transportation needs:    Medical: Not on file    Non-medical: Not on file  Tobacco Use  . Smoking status: Never Smoker  . Smokeless tobacco: Never Used  Substance and Sexual Activity  . Alcohol use: Yes    Alcohol/week: 0.0 standard drinks    Comment: occas  . Drug use: No  . Sexual activity: Yes    Partners: Male    Birth control/protection: IUD  Lifestyle  . Physical activity:    Days per week: Not on file    Minutes per session: Not on file  . Stress: Not on file  Relationships  . Social connections:    Talks on phone: Not on file    Gets together: Not on file    Attends religious service: Not on file    Active member of club or organization: Not on file    Attends meetings of clubs or organizations:  Not on file    Relationship status: Not on file  . Intimate partner violence:    Fear of current or ex partner: Not on file    Emotionally abused: Not on file    Physically abused: Not on file    Forced sexual activity: Not on file  Other Topics Concern  . Not on file  Social History Narrative  . Not on file    Family History  Problem Relation Age of Onset  . Breast cancer Mother 51  . Cancer Mother   . Diabetes Mother   . Cancer Paternal Grandmother        colon  . Diabetes Paternal Grandmother   . Diabetes Maternal Grandmother   . Heart disease Neg Hx   . Ovarian cancer Neg Hx     The following portions of the patient's history were reviewed and updated as appropriate: allergies, current medications, past family history, past medical history, past social history, past surgical history and problem list.  Review of Systems Review of Systems  Constitutional: Negative.   HENT: Negative.   Eyes: Negative.   Respiratory: Negative.   Cardiovascular: Negative.   Gastrointestinal: Positive for abdominal pain. Negative for blood in stool, constipation, diarrhea, heartburn, melena, nausea and vomiting.       History of ulcerative colitis  Genitourinary:  Positive for frequency and urgency.       History of interstitial cystitis History of deep thrusting dyspareunia, ongoing  Musculoskeletal: Negative.   Skin: Negative.   Neurological: Negative.   Endo/Heme/Allergies: Negative.   Psychiatric/Behavioral: Negative.      Objective:   BP 101/68   Pulse 77   Ht 5' 7"  (1.702 m)   Wt 140 lb 3.2 oz (63.6 kg)   LMP  (LMP Unknown) Comment: spotting only  BMI 21.96 kg/m  CONSTITUTIONAL: Well-developed, well-nourished female in no acute distress.  PSYCHIATRIC: Normal mood and affect. Normal behavior. Normal judgment and thought content. [place]: Alert and oriented to person, place, and time. Normal muscle tone coordination. No cranial nerve deficit noted. HENT:  Normocephalic, atraumatic, External right and left ear normal. EYES: Conjunctivae and EOM are normal. No scleral icterus.  NECK: Normal range of motion, supple, no masses.  Normal thyroid.  SKIN: Skin is warm and dry. No rash noted. Not diaphoretic. No erythema. No pallor. CARDIOVASCULAR: Normal heart rate noted, regular rhythm, no murmur. RESPIRATORY: Clear to auscultation bilaterally. Effort and breath sounds normal, no problems with respiration noted. BREASTS: Symmetric in size. No masses, skin changes, nipple drainage, or lymphadenopathy. ABDOMEN: Soft, normal bowel sounds, no distention noted.  Mild lower abdominal tenderness without peritoneal signs BLADDER: Tender PELVIC:  External Genitalia: Normal  BUS: Normal  Vagina: Normal  Cervix: Normal; mild cervical motion tenderness 3/4; IUD strings 1-2 cm long  Uterus: midplane to retroverted;tender 3/4; mobility decreased  Adnexa: Nonpalpable; right lower quadrant 1/4 tender; left lower quadrant 2/4 tender  RV: External Exam NormaI  MUSCULOSKELETAL: Normal range of motion. No tenderness.  No cyanosis, clubbing, or edema.  2+ distal pulses. LYMPHATIC: No Axillary, Supraclavicular, or Inguinal Adenopathy.    Assessment:    Annual gynecologic examination 32 y.o. Contraception:iud Normal BMI Problem List Items Addressed This Visit    Endometriosis   Hx of migraine headaches    Other Visit Diagnoses    Well woman exam with routine gynecological exam    -  Primary   Encounter for routine checking of intrauterine contraceptive device (IUD)        Endometriosis  with increased discomfort recently and exam slightly worse than previous follow-up Patient is an LAVH candidate if hysterectomy is desired  Plan:  Pap: Mammogram: due age 73 Stool Guaiac Testing:  Not Indicated Labs: thru pcp Routine preventative health maintenance measures emphasized: Exercise/Diet/Weight control, Tobacco Warnings, Alcohol/Substance use risks, Stress Management and Peer Pressure Issues Contraception-Mirena IUD Trial of Orilissa 150 mg daily Return to clinic-4 weeks for follow-up on pelvic pain and Freida Busman Return to [place], CMA  [name], MD  Note: This dictation was prepared with Dragon dictation along with smaller phrase technology. Any transcriptional errors that result from this process are unintentional.

## 2017-10-05 ENCOUNTER — Other Ambulatory Visit (HOSPITAL_COMMUNITY)
Admission: RE | Admit: 2017-10-05 | Discharge: 2017-10-05 | Disposition: A | Discharge status: Home / Self Care | Payer: BLUE CROSS/BLUE SHIELD | Source: Ambulatory Visit | Attending: Obstetrics and Gynecology | Admitting: Obstetrics and Gynecology

## 2017-10-05 ENCOUNTER — Encounter: Payer: Self-pay | Admitting: Obstetrics and Gynecology

## 2017-10-05 ENCOUNTER — Ambulatory Visit (INDEPENDENT_AMBULATORY_CARE_PROVIDER_SITE_OTHER): Payer: BLUE CROSS/BLUE SHIELD | Admitting: Obstetrics and Gynecology

## 2017-10-05 VITALS — BP 101/68 | HR 77 | Ht 67.0 in | Wt 140.2 lb

## 2017-10-05 DIAGNOSIS — K5180 Other ulcerative colitis without complications: Secondary | ICD-10-CM

## 2017-10-05 DIAGNOSIS — Z30431 Encounter for routine checking of intrauterine contraceptive device: Secondary | ICD-10-CM

## 2017-10-05 DIAGNOSIS — Z8669 Personal history of other diseases of the nervous system and sense organs: Secondary | ICD-10-CM

## 2017-10-05 DIAGNOSIS — N3010 Interstitial cystitis (chronic) without hematuria: Secondary | ICD-10-CM

## 2017-10-05 DIAGNOSIS — K5190 Ulcerative colitis, unspecified, without complications: Secondary | ICD-10-CM | POA: Insufficient documentation

## 2017-10-05 DIAGNOSIS — Z01419 Encounter for gynecological examination (general) (routine) without abnormal findings: Secondary | ICD-10-CM | POA: Insufficient documentation

## 2017-10-05 DIAGNOSIS — N809 Endometriosis, unspecified: Secondary | ICD-10-CM | POA: Diagnosis not present

## 2017-10-05 MED ORDER — ELAGOLIX SODIUM 150 MG PO TABS: 150.0000 mg | ORAL_TABLET | Freq: Every day | ORAL | 6 refills | Status: DC

## 2017-10-05 NOTE — Patient Instructions (Addendum)
1.  Pap smear is done. 2.  Self breast awareness is encouraged. 3.  Screening labs are obtained through primary care 4.  Colonoscopy last week was done for follow-up on ulcerative colitis-biopsies pending 5.  Continue with healthy eating and exercise. 6.  Contraception-Mirena IUD 7.  Begin trial of Orilissa 150 mg daily for symptomatic endometriosis 8.  Return in 4 weeks for follow-up 9.  Return in 1 year for annual exam  Health Maintenance, Female Adopting a healthy lifestyle and getting preventive care can go a long way to promote health and wellness. Talk with your health care provider about what schedule of regular examinations is right for you. This is a good chance for you to check in with your provider about disease prevention and staying healthy. In between checkups, there are plenty of things you can do on your own. Experts have done a lot of research about which lifestyle changes and preventive measures are most likely to keep you healthy. Ask your health care provider for more information. Weight and diet Eat a healthy diet  Be sure to include plenty of vegetables, fruits, low-fat dairy products, and lean protein.  Do not eat a lot of foods high in solid fats, added sugars, or salt.  Get regular exercise. This is one of the most important things you can do for your health. ? Most adults should exercise for at least 150 minutes each week. The exercise should increase your heart rate and make you sweat (moderate-intensity exercise). ? Most adults should also do strengthening exercises at least twice a week. This is in addition to the moderate-intensity exercise.  Maintain a healthy weight  Body mass index (BMI) is a measurement that can be used to identify possible weight problems. It estimates body fat based on height and weight. Your health care provider can help determine your BMI and help you achieve or maintain a healthy weight.  For females 33 years of age and older: ? A  BMI below 18.5 is considered underweight. ? A BMI of 18.5 to 24.9 is normal. ? A BMI of 25 to 29.9 is considered overweight. ? A BMI of 30 and above is considered obese.  Watch levels of cholesterol and blood lipids  You should start having your blood tested for lipids and cholesterol at 34 years of age, then have this test every 5 years.  You may need to have your cholesterol levels checked more often if: ? Your lipid or cholesterol levels are high. ? You are older than 34 years of age. ? You are at high risk for heart disease.  Cancer screening Lung Cancer  Lung cancer screening is recommended for adults 38-46 years old who are at high risk for lung cancer because of a history of smoking.  A yearly low-dose CT scan of the lungs is recommended for people who: ? Currently smoke. ? Have quit within the past 15 years. ? Have at least a 30-pack-year history of smoking. A pack year is smoking an average of one pack of cigarettes a day for 1 year.  Yearly screening should continue until it has been 15 years since you quit.  Yearly screening should stop if you develop a health problem that would prevent you from having lung cancer treatment.  Breast Cancer  Practice breast self-awareness. This means understanding how your breasts normally appear and feel.  It also means doing regular breast self-exams. Let your health care provider know about any changes, no matter how small.  If  you are in your 20s or 30s, you should have a clinical breast exam (CBE) by a health care provider every 1-3 years as part of a regular health exam.  If you are 4 or older, have a CBE every year. Also consider having a breast X-ray (mammogram) every year.  If you have a family history of breast cancer, talk to your health care provider about genetic screening.  If you are at high risk for breast cancer, talk to your health care provider about having an MRI and a mammogram every year.  Breast cancer gene  (BRCA) assessment is recommended for women who have family members with BRCA-related cancers. BRCA-related cancers include: ? Breast. ? Ovarian. ? Tubal. ? Peritoneal cancers.  Results of the assessment will determine the need for genetic counseling and BRCA1 and BRCA2 testing.  Cervical Cancer Your health care provider may recommend that you be screened regularly for cancer of the pelvic organs (ovaries, uterus, and vagina). This screening involves a pelvic examination, including checking for microscopic changes to the surface of your cervix (Pap test). You may be encouraged to have this screening done every 3 years, beginning at age 63.  For women ages 27-65, health care providers may recommend pelvic exams and Pap testing every 3 years, or they may recommend the Pap and pelvic exam, combined with testing for human papilloma virus (HPV), every 5 years. Some types of HPV increase your risk of cervical cancer. Testing for HPV may also be done on women of any age with unclear Pap test results.  Other health care providers may not recommend any screening for nonpregnant women who are considered low risk for pelvic cancer and who do not have symptoms. Ask your health care provider if a screening pelvic exam is right for you.  If you have had past treatment for cervical cancer or a condition that could lead to cancer, you need Pap tests and screening for cancer for at least 20 years after your treatment. If Pap tests have been discontinued, your risk factors (such as having a new sexual partner) need to be reassessed to determine if screening should resume. Some women have medical problems that increase the chance of getting cervical cancer. In these cases, your health care provider may recommend more frequent screening and Pap tests.  Colorectal Cancer  This type of cancer can be detected and often prevented.  Routine colorectal cancer screening usually begins at 34 years of age and continues  through 34 years of age.  Your health care provider may recommend screening at an earlier age if you have risk factors for colon cancer.  Your health care provider may also recommend using home test kits to check for hidden blood in the stool.  A small camera at the end of a tube can be used to examine your colon directly (sigmoidoscopy or colonoscopy). This is done to check for the earliest forms of colorectal cancer.  Routine screening usually begins at age 73.  Direct examination of the colon should be repeated every 5-10 years through 34 years of age. However, you may need to be screened more often if early forms of precancerous polyps or small growths are found.  Skin Cancer  Check your skin from head to toe regularly.  Tell your health care provider about any new moles or changes in moles, especially if there is a change in a mole's shape or color.  Also tell your health care provider if you have a mole that is larger  than the size of a pencil eraser.  Always use sunscreen. Apply sunscreen liberally and repeatedly throughout the day.  Protect yourself by wearing long sleeves, pants, a wide-brimmed hat, and sunglasses whenever you are outside.  Heart disease, diabetes, and high blood pressure  High blood pressure causes heart disease and increases the risk of stroke. High blood pressure is more likely to develop in: ? People who have blood pressure in the high end of the normal range (130-139/85-89 mm Hg). ? People who are overweight or obese. ? People who are African American.  If you are 27-83 years of age, have your blood pressure checked every 3-5 years. If you are 55 years of age or older, have your blood pressure checked every year. You should have your blood pressure measured twice-once when you are at a hospital or clinic, and once when you are not at a hospital or clinic. Record the average of the two measurements. To check your blood pressure when you are not at a  hospital or clinic, you can use: ? An automated blood pressure machine at a pharmacy. ? A home blood pressure monitor.  If you are between 58 years and 38 years old, ask your health care provider if you should take aspirin to prevent strokes.  Have regular diabetes screenings. This involves taking a blood sample to check your fasting blood sugar level. ? If you are at a normal weight and have a low risk for diabetes, have this test once every three years after 34 years of age. ? If you are overweight and have a high risk for diabetes, consider being tested at a younger age or more often. Preventing infection Hepatitis B  If you have a higher risk for hepatitis B, you should be screened for this virus. You are considered at high risk for hepatitis B if: ? You were born in a country where hepatitis B is common. Ask your health care provider which countries are considered high risk. ? Your parents were born in a high-risk country, and you have not been immunized against hepatitis B (hepatitis B vaccine). ? You have HIV or AIDS. ? You use needles to inject street drugs. ? You live with someone who has hepatitis B. ? You have had sex with someone who has hepatitis B. ? You get hemodialysis treatment. ? You take certain medicines for conditions, including cancer, organ transplantation, and autoimmune conditions.  Hepatitis C  Blood testing is recommended for: ? Everyone born from [birth year] through 1967. ? Anyone with known risk factors for hepatitis C.  Sexually transmitted infections (STIs)  You should be screened for sexually transmitted infections (STIs) including gonorrhea and chlamydia if: ? You are sexually active and are younger than 34 years of age. ? You are older than 34 years of age and your health care provider tells you that you are at risk for this type of infection. ? Your sexual activity has changed since you were last screened and you are at an increased risk for chlamydia or  gonorrhea. Ask your health care provider if you are at risk.  If you do not have HIV, but are at risk, it may be recommended that you take a prescription medicine daily to prevent HIV infection. This is called pre-exposure prophylaxis (PrEP). You are considered at risk if: ? You are sexually active and do not regularly use condoms or know the HIV status of your partner(s). ? You take drugs by injection. ? You are sexually active with  a partner who has HIV.  Talk with your health care provider about whether you are at high risk of being infected with HIV. If you choose to begin PrEP, you should first be tested for HIV. You should then be tested every 3 months for as long as you are taking PrEP. Pregnancy  If you are premenopausal and you may become pregnant, ask your health care provider about preconception counseling.  If you may become pregnant, take 400 to 800 micrograms (mcg) of folic acid every day.  If you want to prevent pregnancy, talk to your health care provider about birth control (contraception). Osteoporosis and menopause  Osteoporosis is a disease in which the bones lose minerals and strength with aging. This can result in serious bone fractures. Your risk for osteoporosis can be identified using a bone density scan.  If you are 88 years of age or older, or if you are at risk for osteoporosis and fractures, ask your health care provider if you should be screened.  Ask your health care provider whether you should take a calcium or vitamin D supplement to lower your risk for osteoporosis.  Menopause may have certain physical symptoms and risks.  Hormone replacement therapy may reduce some of these symptoms and risks. Talk to your health care provider about whether hormone replacement therapy is right for you. Follow these instructions at home:  Schedule regular health, dental, and eye exams.  Stay current with your immunizations.  Do not use any tobacco products including  cigarettes, chewing tobacco, or electronic cigarettes.  If you are pregnant, do not drink alcohol.  If you are breastfeeding, limit how much and how often you drink alcohol.  Limit alcohol intake to no more than 1 drink per day for nonpregnant women. One drink equals 12 ounces of beer, 5 ounces of wine, or 1 ounces of hard liquor.  Do not use street drugs.  Do not share needles.  Ask your health care provider for help if you need support or information about quitting drugs.  Tell your health care provider if you often feel depressed.  Tell your health care provider if you have ever been abused or do not feel safe at home. This information is not intended to replace advice given to you by your health care provider. Make sure you discuss any questions you have with your health care provider. Document Released: 07/19/2010 Document Revised: 06/11/2015 Document Reviewed: 10/07/2014 Elsevier Interactive Patient Education  [employer].

## 2017-10-10 LAB — CYTOLOGY - PAP
Diagnosis: NEGATIVE
HPV (WINDOPATH): NOT DETECTED

## 2017-11-02 ENCOUNTER — Encounter: Payer: BLUE CROSS/BLUE SHIELD | Admitting: Obstetrics and Gynecology

## 2017-11-20 ENCOUNTER — Ambulatory Visit (INDEPENDENT_AMBULATORY_CARE_PROVIDER_SITE_OTHER): Payer: BLUE CROSS/BLUE SHIELD

## 2017-11-20 ENCOUNTER — Ambulatory Visit (INDEPENDENT_AMBULATORY_CARE_PROVIDER_SITE_OTHER): Payer: BLUE CROSS/BLUE SHIELD | Admitting: Podiatry

## 2017-11-20 ENCOUNTER — Encounter: Payer: Self-pay | Admitting: Podiatry

## 2017-11-20 VITALS — BP 124/74 | HR 74

## 2017-11-20 DIAGNOSIS — M779 Enthesopathy, unspecified: Secondary | ICD-10-CM

## 2017-11-20 DIAGNOSIS — M7671 Peroneal tendinitis, right leg: Secondary | ICD-10-CM | POA: Diagnosis not present

## 2017-11-20 DIAGNOSIS — M778 Other enthesopathies, not elsewhere classified: Secondary | ICD-10-CM

## 2017-11-20 MED ORDER — TRIAMCINOLONE ACETONIDE 10 MG/ML IJ SUSP
10.0000 mg | Freq: Once | INTRAMUSCULAR | Status: AC
  Administered 2017-11-20: 10 mg

## 2017-11-22 NOTE — Progress Notes (Signed)
Subjective:   Patient ID: Bethany Harrison, female   DOB: 32 y.o.   MRN: [id number]   HPI Patient presents stating she is developed a lot of pain in the outside of the right foot over the last few months and does not remember specific injury to the area.  Patient had surgery approximately 11 years ago does very well with occasional discomfort in the big toe joint right   Review of Systems  All other systems reviewed and are negative.       Objective:  Physical Exam  Constitutional: She appears well-developed and well-nourished.  Cardiovascular: Intact distal pulses.  Pulmonary/Chest: Effort normal.  Musculoskeletal: Normal range of motion.  Neurological: She is alert.  Skin: Skin is warm.  Nursing note and vitals reviewed.   Neurovascular status intact with patient found to have excellent healing from first MPJ surgery bilateral good range of motion with discomfort in the outside of the right foot around the peroneal tendon insertion into the base of fifth metatarsal with no indication of tendon dysfunction     Assessment:  Acute peroneal tendinitis right with well-healing surgical sites from previous surgery     Plan:  H&P condition reviewed and careful injection administered to the lateral side right foot within the she at the insertion of the peroneal tendon into the base of fifth metatarsal.  Sterile prep done prior and did discuss risk factors of injection.  Also applied fascial brace to lift up the lateral side of the foot along with aggressive ice therapy and patient will be seen back to reevaluate  X-rays indicate no signs of fracture with moderate prominence of the base the fifth metatarsal right

## 2018-03-06 DIAGNOSIS — F329 Major depressive disorder, single episode, unspecified: Secondary | ICD-10-CM | POA: Insufficient documentation

## 2018-03-06 DIAGNOSIS — F339 Major depressive disorder, recurrent, unspecified: Secondary | ICD-10-CM | POA: Insufficient documentation

## 2018-07-11 ENCOUNTER — Encounter: Payer: Self-pay | Admitting: Podiatry

## 2018-07-11 ENCOUNTER — Ambulatory Visit (INDEPENDENT_AMBULATORY_CARE_PROVIDER_SITE_OTHER): Payer: BC Managed Care – PPO | Admitting: Podiatry

## 2018-07-11 ENCOUNTER — Other Ambulatory Visit: Payer: Self-pay | Admitting: Podiatry

## 2018-07-11 ENCOUNTER — Other Ambulatory Visit: Payer: Self-pay

## 2018-07-11 ENCOUNTER — Ambulatory Visit (INDEPENDENT_AMBULATORY_CARE_PROVIDER_SITE_OTHER): Payer: BC Managed Care – PPO

## 2018-07-11 VITALS — Temp 98.7°F

## 2018-07-11 DIAGNOSIS — M25572 Pain in left ankle and joints of left foot: Secondary | ICD-10-CM | POA: Diagnosis not present

## 2018-07-11 DIAGNOSIS — M779 Enthesopathy, unspecified: Secondary | ICD-10-CM | POA: Diagnosis not present

## 2018-07-11 DIAGNOSIS — M7672 Peroneal tendinitis, left leg: Secondary | ICD-10-CM | POA: Diagnosis not present

## 2018-07-11 DIAGNOSIS — M7671 Peroneal tendinitis, right leg: Secondary | ICD-10-CM

## 2018-07-11 DIAGNOSIS — M25571 Pain in right ankle and joints of right foot: Secondary | ICD-10-CM

## 2018-07-11 MED ORDER — DICLOFENAC SODIUM 75 MG PO TBEC: 75.0000 mg | DELAYED_RELEASE_TABLET | Freq: Two times a day (BID) | ORAL | 2 refills | Status: DC

## 2018-07-15 NOTE — Progress Notes (Signed)
Subjective:   Patient ID: Bethany Harrison, female   DOB: 33 y.o.   MRN: [id number]   HPI Patient presents stating that she has had real problems with flat feet and develops discomfort every time she tries to be active or running.  Points to the inside of the ankles of both feet and states she also has flatfeet   ROS      Objective:  Physical Exam  Ocular status intact with patient found to have diminishment of the arch height bilateral with history of bunions and posterior tibial inflammation with palpation bilateral     Assessment:  Posterior tibial discomfort with tendinitis secondary to foot structure     Plan:  H&P educated her on condition and reviewed x-rays.  Due to longstanding nature and the fact it is related to activity I recommended orthotics to try to hold up the arch and I am referring her to ped orthotist for orthotics.  Patient will be seen back for [place] to reevaluate and fascial brace is dispensed today  X-rays indicate that there is depression of the arch bilateral with no other indications of significant pathology

## 2018-07-19 ENCOUNTER — Other Ambulatory Visit: Payer: BC Managed Care – PPO | Admitting: Orthotics

## 2018-08-14 ENCOUNTER — Other Ambulatory Visit: Payer: BC Managed Care – PPO | Admitting: Orthotics

## 2018-10-10 ENCOUNTER — Encounter: Payer: BLUE CROSS/BLUE SHIELD | Admitting: Obstetrics and Gynecology

## 2018-10-17 ENCOUNTER — Encounter: Payer: BLUE CROSS/BLUE SHIELD | Admitting: Obstetrics and Gynecology

## 2018-11-07 ENCOUNTER — Encounter: Payer: BLUE CROSS/BLUE SHIELD | Admitting: Obstetrics and Gynecology

## 2018-11-20 ENCOUNTER — Other Ambulatory Visit: Payer: Self-pay

## 2018-11-20 ENCOUNTER — Ambulatory Visit (INDEPENDENT_AMBULATORY_CARE_PROVIDER_SITE_OTHER): Payer: BC Managed Care – PPO | Admitting: Obstetrics and Gynecology

## 2018-11-20 ENCOUNTER — Encounter: Payer: Self-pay | Admitting: Obstetrics and Gynecology

## 2018-11-20 VITALS — BP 129/80 | HR 93 | Ht 67.0 in | Wt 142.8 lb

## 2018-11-20 DIAGNOSIS — N809 Endometriosis, unspecified: Secondary | ICD-10-CM

## 2018-11-20 DIAGNOSIS — Z01419 Encounter for gynecological examination (general) (routine) without abnormal findings: Secondary | ICD-10-CM | POA: Diagnosis not present

## 2018-11-20 NOTE — Progress Notes (Signed)
HPI:      [name] is a 33 y.o. G0P0 who LMP was No LMP recorded. (Menstrual status: IUD).  Subjective:   She presents today for her annual examination.  She has Mirena IUD for birth control endometriosis and cycle control.  She has monthly menses but they are very short and involve minimal bleeding.  She says she sometimes does not have to wear a pad. She has had interstitial cystitis, ulcerative colitis and dyspareunia as previous diagnoses.  She is not having significant issues from any of the above at this time. Of significant note she has a family history of breast cancer.  Her mother was diagnosed at the age of 7.  Mother had BRCA testing and was negative. Previous physicians have discussed the possibility of hysterectomy with her because of the endometriosis and history of pelvic pain but she has been delaying this because she was considering childbirth.  She still remains undecided regarding childbirth but does not believe a hysterectomy will be necessary either way.    Hx: The following portions of the patient's history were reviewed and updated as appropriate:             She  has a past medical history of Abdominal pain, Anxiety, Depression, Endometriosis, GERD (gastroesophageal reflux disease), Headache, Hemorrhoid, Kidney stone, and Ulcerative colitis ([place]). She does not have any pertinent problems on file. She  has a past surgical history that includes Bunionectomy (08, 10); Tonsillectomy; Dilation and curettage of uterus (2013); Appendectomy (2013); Pelvic laparoscopy (2009); Colonoscopy (2014); Intrauterine device insertion (06/24/2011); Tonsillectomy; Diagnostic laparoscopy; Hernia repair (89); Chromopertubation (N/A, 06/30/2014); laparoscopy (N/A, 06/30/2014); and IUD removal (N/A, 06/30/2014). Her family history includes Breast cancer (age of onset: 31) in her mother; Cancer in her mother and paternal grandmother; Diabetes in her maternal grandmother, mother, and paternal  grandmother. She  reports that she has never smoked. She has never used smokeless tobacco. She reports current alcohol use. She reports that she does not use drugs. She has a current medication list which includes the following prescription(s): alprazolam, diclofenac, fluticasone, levonorgestrel, rizatriptan, and sertraline. She is allergic to sulfa antibiotics.       Review of Systems:  Review of Systems  Constitutional: Denied constitutional symptoms, night sweats, recent illness, fatigue, fever, insomnia and weight loss.  Eyes: Denied eye symptoms, eye pain, photophobia, vision change and visual disturbance.  Ears/Nose/Throat/Neck: Denied ear, nose, throat or neck symptoms, hearing loss, nasal discharge, sinus congestion and sore throat.  Cardiovascular: Denied cardiovascular symptoms, arrhythmia, chest pain/pressure, edema, exercise intolerance, orthopnea and palpitations.  Respiratory: Denied pulmonary symptoms, asthma, pleuritic pain, productive sputum, cough, dyspnea and wheezing.  Gastrointestinal: Denied, gastro-esophageal reflux, melena, nausea and vomiting.  Genitourinary: See HPI for additional information.  Musculoskeletal: Denied musculoskeletal symptoms, stiffness, swelling, muscle weakness and myalgia.  Dermatologic: Denied dermatology symptoms, rash and scar.  Neurologic: Denied neurology symptoms, dizziness, headache, neck pain and syncope.  Psychiatric: Denied psychiatric symptoms, anxiety and depression.  Endocrine: Denied endocrine symptoms including hot flashes and night sweats.   Meds:   Current Outpatient Medications on File Prior to Visit  Medication Sig Dispense Refill  . ALPRAZolam (XANAX) 0.25 MG tablet Take 0.25 mg by mouth as needed.     . diclofenac (VOLTAREN) 75 MG EC tablet Take 1 tablet (75 mg total) by mouth 2 (two) times daily. 50 tablet 2  . fluticasone (FLONASE) 50 MCG/ACT nasal spray Place 1 spray into both nostrils daily. ENT/ otc    .  levonorgestrel (MIRENA) 20 MCG/24HR IUD 1 each by Intrauterine route once.    . rizatriptan (MAXALT) 5 MG tablet Take 5 mg by mouth as needed for migraine. May repeat in 2 hours if needed    . sertraline (ZOLOFT) 25 MG tablet Take 25 mg by mouth daily.     No current facility-administered medications on file prior to visit.     Objective:     Vitals:   11/20/18 1506  BP: 129/80  Pulse: 93              Physical examination General NAD, Conversant  HEENT Atraumatic; Op clear with mmm.  Normo-cephalic. Pupils reactive. Anicteric sclerae  Thyroid/Neck Smooth without nodularity or enlargement. Normal ROM.  Neck Supple.  Skin No rashes, lesions or ulceration. Normal palpated skin turgor. No nodularity.  Breasts: No masses or discharge.  Symmetric.  No axillary adenopathy.  Lungs: Clear to auscultation.No rales or wheezes. Normal Respiratory effort, no retractions.  Heart: NSR.  No murmurs or rubs appreciated. No periferal edema  Abdomen: Soft.  Non-tender.  No masses.  No HSM. No hernia  Extremities: Moves all appropriately.  Normal ROM for age. No lymphadenopathy.  Neuro: Oriented to PPT.  Normal mood. Normal affect.     Pelvic:   Vulva: Normal appearance.  No lesions.  Vagina: No lesions or abnormalities noted.  Support: Normal pelvic support.  Urethra No masses tenderness or scarring.  Meatus Normal size without lesions or prolapse.  Cervix: Normal appearance.  No lesions.  IUD strings noted at the cervical os  Anus: Normal exam.  No lesions.  Perineum: Normal exam.  No lesions.        Bimanual   Uterus: Normal size.  Non-tender.  Mobile.  AV.  Adnexae: No masses.  Non-tender to palpation.  Cul-de-sac: Negative for abnormality.      Assessment:    G0P0 Patient Active Problem List   Diagnosis Date Noted  . Anxiety and depression 03/06/2018  . Ulcerative colitis ([place]) 10/05/2017  . Headache disorder 02/02/2017  . Mittelschmerz 09/22/2016  . Migraine without aura and  without status migrainosus, not intractable 05/17/2016  . Seasonal allergic rhinitis 05/17/2016  . Hx of migraine headaches 03/23/2016  . Pelvic pain in female 03/23/2016  . Interstitial cystitis 09/04/2014  . Endometriosis 06/30/2014  . Abdominal pain      1. Well woman exam with routine gynecological exam   2. Endometriosis        Plan:            1.  Basic Screening Recommendations The basic screening recommendations for asymptomatic women were discussed with the patient during her visit.  The age-appropriate recommendations were discussed with her and the rational for the tests reviewed.  When I am informed by the patient that another primary care physician has previously obtained the age-appropriate tests and they are up-to-date, only outstanding tests are ordered and referrals given as necessary.  Abnormal results of tests will be discussed with her when all of her results are completed.  Routine preventative health maintenance measures emphasized: Exercise/Diet/Weight control, Tobacco Warnings, Alcohol/Substance use risks and Stress Management Pap next year-recommend mammography age 34/38. Orders No orders of the defined types were placed in this encounter.   No orders of the defined types were placed in this encounter.       F/U  Return in about 1 year (around 11/20/2019) for Annual Physical.  Finis Bud, M.D. 11/20/2018 3:41 PM

## 2019-02-14 ENCOUNTER — Encounter: Payer: Self-pay | Admitting: Obstetrics and Gynecology

## 2019-02-14 ENCOUNTER — Other Ambulatory Visit: Payer: Self-pay

## 2019-02-14 ENCOUNTER — Ambulatory Visit (INDEPENDENT_AMBULATORY_CARE_PROVIDER_SITE_OTHER): Payer: BC Managed Care – PPO | Admitting: Obstetrics and Gynecology

## 2019-02-14 VITALS — BP 126/78 | HR 66 | Ht 67.0 in | Wt 145.0 lb

## 2019-02-14 DIAGNOSIS — Z30432 Encounter for removal of intrauterine contraceptive device: Secondary | ICD-10-CM

## 2019-02-14 DIAGNOSIS — Z3169 Encounter for other general counseling and advice on procreation: Secondary | ICD-10-CM | POA: Diagnosis not present

## 2019-02-14 NOTE — Progress Notes (Signed)
HPI:      [name] is a 34 y.o. G0P0 who LMP was No LMP recorded. (Menstrual status: IUD).  Subjective:   She presents today because she would like her IUD removed and would like to begin attempting pregnancy. When she does not have an IUD she describes her cycles as very irregular.  Sometimes she bleeds for 20 to 30 days in a row and then skip cycles.  She reports her cycles is very unpredictable. She also states that she believes she can tell when she ovulates every month. She states her husband has only one testicle and has previously been found to have a low sperm count with slow movement. She has scheduled an appointment in 1 month at the infertility clinic at [facility] for consultation "in case" she has issues getting pregnant.    Hx: The following portions of the patient's history were reviewed and updated as appropriate:             She  has a past medical history of Abdominal pain, Anxiety, Depression, Endometriosis, GERD (gastroesophageal reflux disease), Headache, Hemorrhoid, Kidney stone, and Ulcerative colitis ([place]). She does not have any pertinent problems on file. She  has a past surgical history that includes Bunionectomy (08, 10); Tonsillectomy; Dilation and curettage of uterus (2013); Appendectomy (2013); Pelvic laparoscopy (2009); Colonoscopy (2014); Intrauterine device insertion (06/24/2011); Tonsillectomy; Diagnostic laparoscopy; Hernia repair (89); Chromopertubation (N/A, 06/30/2014); laparoscopy (N/A, 06/30/2014); and IUD removal (N/A, 06/30/2014). Her family history includes Breast cancer (age of onset: 39) in her mother; Cancer in her mother and paternal grandmother; Diabetes in her maternal grandmother, mother, and paternal grandmother. She  reports that she has never smoked. She has never used smokeless tobacco. She reports current alcohol use. She reports that she does not use drugs. She has a current medication list which includes the following prescription(s): alprazolam,  diclofenac, fluticasone, levonorgestrel, sertraline, ubrogepant, and rizatriptan. She is allergic to sulfa antibiotics.       Review of Systems:  Review of Systems  Constitutional: Denied constitutional symptoms, night sweats, recent illness, fatigue, fever, insomnia and weight loss.  Eyes: Denied eye symptoms, eye pain, photophobia, vision change and visual disturbance.  Ears/Nose/Throat/Neck: Denied ear, nose, throat or neck symptoms, hearing loss, nasal discharge, sinus congestion and sore throat.  Cardiovascular: Denied cardiovascular symptoms, arrhythmia, chest pain/pressure, edema, exercise intolerance, orthopnea and palpitations.  Respiratory: Denied pulmonary symptoms, asthma, pleuritic pain, productive sputum, cough, dyspnea and wheezing.  Gastrointestinal: Denied, gastro-esophageal reflux, melena, nausea and vomiting.  Genitourinary: Denied genitourinary symptoms including symptomatic vaginal discharge, pelvic relaxation issues, and urinary complaints.  Musculoskeletal: Denied musculoskeletal symptoms, stiffness, swelling, muscle weakness and myalgia.  Dermatologic: Denied dermatology symptoms, rash and scar.  Neurologic: Denied neurology symptoms, dizziness, headache, neck pain and syncope.  Psychiatric: Denied psychiatric symptoms, anxiety and depression.  Endocrine: Denied endocrine symptoms including hot flashes and night sweats.   Meds:   Current Outpatient Medications on File Prior to Visit  Medication Sig Dispense Refill  . ALPRAZolam (XANAX) 0.25 MG tablet Take 0.25 mg by mouth as needed.     . diclofenac (VOLTAREN) 75 MG EC tablet Take 1 tablet (75 mg total) by mouth 2 (two) times daily. 50 tablet 2  . fluticasone (FLONASE) 50 MCG/ACT nasal spray Place 1 spray into both nostrils daily. ENT/ otc    . levonorgestrel (MIRENA) 20 MCG/24HR IUD 1 each by Intrauterine route once.    . sertraline (ZOLOFT) 25 MG tablet Take 25 mg by mouth daily.    Marland Kitchen  Ubrogepant 50 MG TABS Take  by mouth.    . rizatriptan (MAXALT) 5 MG tablet Take 5 mg by mouth as needed for migraine. May repeat in 2 hours if needed     No current facility-administered medications on file prior to visit.    Objective:     Vitals:   02/14/19 0937  BP: 126/78  Pulse: 66              IUD Removal Strings of IUD identified and grasped.  IUD removed without problem.  Pt tolerated this well.  IUD noted to be intact.     Assessment:    G0P0 Patient Active Problem List   Diagnosis Date Noted  . Anxiety and depression 03/06/2018  . Ulcerative colitis ([place]) 10/05/2017  . Headache disorder 02/02/2017  . Mittelschmerz 09/22/2016  . Migraine without aura and without status migrainosus, not intractable 05/17/2016  . Seasonal allergic rhinitis 05/17/2016  . Hx of migraine headaches 03/23/2016  . Pelvic pain in female 03/23/2016  . Interstitial cystitis 09/04/2014  . Endometriosis 06/30/2014  . Abdominal pain      1. Encounter for IUD removal   2. Pre-conception counseling     Possible future infertility issues with irregular cycles and sperm count issues.   Plan:            1.  We have discussed timing of pregnancy, use of ovulation predictors, normal cycle lengths.  We have spoken about possible intrauterine insemination for low sperm counts as well as a hormonal work-up for anovulatory menstrual cycles.  We have discussed all the above at some length.  I recommended that she continue use of prenatal vitamins while attempting pregnancy.  Orders No orders of the defined types were placed in this encounter.   No orders of the defined types were placed in this encounter.     F/U  Return in about 6 months (around 08/14/2019). I spent 23 minutes involved in the care of this patient preparing to see the patient by obtaining and reviewing her medical history (including labs, imaging tests and prior procedures), documenting clinical information in the electronic health record (EHR),  counseling and coordinating care plans, writing and sending prescriptions, ordering tests or procedures and directly communicating with the patient by discussing pertinent items from her history and physical exam as well as detailing my assessment and plan as noted above so that she has an informed understanding.  All of her questions were answered. Finis Bud, M.D. 02/14/2019 10:01 AM

## 2019-02-14 NOTE — Progress Notes (Signed)
Patient comes in today for IUD removal. She is wanting to get pregnant.

## 2019-11-21 ENCOUNTER — Encounter: Payer: BLUE CROSS/BLUE SHIELD | Admitting: Obstetrics and Gynecology

## 2019-11-26 ENCOUNTER — Ambulatory Visit (INDEPENDENT_AMBULATORY_CARE_PROVIDER_SITE_OTHER): Payer: BC Managed Care – PPO | Admitting: Obstetrics and Gynecology

## 2019-11-26 ENCOUNTER — Encounter: Payer: Self-pay | Admitting: Obstetrics and Gynecology

## 2019-11-26 ENCOUNTER — Other Ambulatory Visit: Payer: Self-pay

## 2019-11-26 VITALS — BP 112/73 | HR 65 | Ht 67.0 in | Wt 147.6 lb

## 2019-11-26 DIAGNOSIS — Z803 Family history of malignant neoplasm of breast: Secondary | ICD-10-CM | POA: Diagnosis not present

## 2019-11-26 DIAGNOSIS — Z01419 Encounter for gynecological examination (general) (routine) without abnormal findings: Secondary | ICD-10-CM

## 2019-11-26 DIAGNOSIS — Z23 Encounter for immunization: Secondary | ICD-10-CM | POA: Diagnosis not present

## 2019-11-26 NOTE — Progress Notes (Signed)
HPI:      [name] is a 34 y.o. G0P0 who LMP was Patient's last menstrual period was 11/13/2019.  Subjective:   She presents today for her annual examination.  She has been attempting pregnancy with [facility] because she has some scarring of her fallopian tubes noted at [facility].  She has normal regular cycles.  She attempted 2 rounds of IUI and then transferred her care to [facility] because she is considering IVF.  She has a significant history for endometriosis and a retroverted uterus.  Her egg counts are not as high as she would like them. Patient has a maternal history of breast cancer.  Mother is BRCA negative.    Hx: The following portions of the patient's history were reviewed and updated as appropriate:             She  has a past medical history of Abdominal pain, Anxiety, Depression, Endometriosis, GERD (gastroesophageal reflux disease), Headache, Hemorrhoid, Kidney stone, and Ulcerative colitis ([place]). She does not have any pertinent problems on file. She  has a past surgical history that includes Bunionectomy (08, 10); Tonsillectomy; Dilation and curettage of uterus (2013); Appendectomy (2013); Pelvic laparoscopy (2009); Colonoscopy (2014); Intrauterine device insertion (06/24/2011); Tonsillectomy; Diagnostic laparoscopy; Hernia repair (89); Chromopertubation (N/A, 06/30/2014); laparoscopy (N/A, 06/30/2014); and IUD removal (N/A, 06/30/2014). Her family history includes Breast cancer (age of onset: 6) in her mother; Cancer in her mother and paternal grandmother; Diabetes in her maternal grandmother, mother, and paternal grandmother. She  reports that she has never smoked. She has never used smokeless tobacco. She reports current alcohol use. She reports that she does not use drugs. She has a current medication list which includes the following prescription(s): fluticasone, sertraline, and ubrogepant. She is allergic to sulfa antibiotics.       Review of Systems:  Review of  Systems  Constitutional: Denied constitutional symptoms, night sweats, recent illness, fatigue, fever, insomnia and weight loss.  Eyes: Denied eye symptoms, eye pain, photophobia, vision change and visual disturbance.  Ears/Nose/Throat/Neck: Denied ear, nose, throat or neck symptoms, hearing loss, nasal discharge, sinus congestion and sore throat.  Cardiovascular: Denied cardiovascular symptoms, arrhythmia, chest pain/pressure, edema, exercise intolerance, orthopnea and palpitations.  Respiratory: Denied pulmonary symptoms, asthma, pleuritic pain, productive sputum, cough, dyspnea and wheezing.  Gastrointestinal: Denied, gastro-esophageal reflux, melena, nausea and vomiting.  Genitourinary: Denied genitourinary symptoms including symptomatic vaginal discharge, pelvic relaxation issues, and urinary complaints.  Musculoskeletal: Denied musculoskeletal symptoms, stiffness, swelling, muscle weakness and myalgia.  Dermatologic: Denied dermatology symptoms, rash and scar.  Neurologic: Denied neurology symptoms, dizziness, headache, neck pain and syncope.  Psychiatric: Denied psychiatric symptoms, anxiety and depression.  Endocrine: Denied endocrine symptoms including hot flashes and night sweats.   Meds:   Current Outpatient Medications on File Prior to Visit  Medication Sig Dispense Refill  . fluticasone (FLONASE) 50 MCG/ACT nasal spray Place 1 spray into both nostrils daily. ENT/ otc    . sertraline (ZOLOFT) 25 MG tablet Take 25 mg by mouth daily.    Marland Kitchen Ubrogepant 50 MG TABS Take by mouth.     No current facility-administered medications on file prior to visit.       The pregnancy intention screening data noted above was reviewed. Potential methods of contraception were discussed. The patient elected to proceed with Pregnant/Seeking Pregnancy.     Objective:     Vitals:   11/26/19 1334  BP: 112/73  Pulse: 65    Filed Weights   11/26/19 1334  Weight:  147 lb 9.6 oz (67 kg)               Physical examination General NAD, Conversant  HEENT Atraumatic; Op clear with mmm.  Normo-cephalic. Pupils reactive. Anicteric sclerae  Thyroid/Neck Smooth without nodularity or enlargement. Normal ROM.  Neck Supple.  Skin No rashes, lesions or ulceration. Normal palpated skin turgor. No nodularity.  Breasts: No masses or discharge.  Symmetric.  No axillary adenopathy.  Lungs: Clear to auscultation.No rales or wheezes. Normal Respiratory effort, no retractions.  Heart: NSR.  No murmurs or rubs appreciated. No periferal edema  Abdomen: Soft.  Non-tender.  No masses.  No HSM. No hernia  Extremities: Moves all appropriately.  Normal ROM for age. No lymphadenopathy.  Neuro: Oriented to PPT.  Normal mood. Normal affect.     Pelvic:   Vulva: Normal appearance.  No lesions.  Vagina: No lesions or abnormalities noted.  Support: Normal pelvic support.  Urethra No masses tenderness or scarring.  Meatus Normal size without lesions or prolapse.  Cervix: Normal appearance.  No lesions.  Anus: Normal exam.  No lesions.  Perineum: Normal exam.  No lesions.        Bimanual   Uterus: Normal size.  Non-tender.  Mobile.  Retroverted  Adnexae: No masses.  Bilateral tenderness to palpation  Cul-de-sac: Negative for abnormality.      Assessment:    G0P0 Patient Active Problem List   Diagnosis Date Noted  . Anxiety and depression 03/06/2018  . Ulcerative colitis ([place]) 10/05/2017  . Headache disorder 02/02/2017  . Mittelschmerz 09/22/2016  . Migraine without aura and without status migrainosus, not intractable 05/17/2016  . Seasonal allergic rhinitis 05/17/2016  . Hx of migraine headaches 03/23/2016  . Pelvic pain in female 03/23/2016  . Interstitial cystitis 09/04/2014  . Endometriosis 06/30/2014  . Abdominal pain      1. Need for immunization against influenza   2. Well woman exam with routine gynecological exam   3. Family history of breast cancer     Normal exam for patient with  known history of endometriosis.   Plan:            1.  Basic Screening Recommendations The basic screening recommendations for asymptomatic women were discussed with the patient during her visit.  The age-appropriate recommendations were discussed with her and the rational for the tests reviewed.  When I am informed by the patient that another primary care physician has previously obtained the age-appropriate tests and they are up-to-date, only outstanding tests are ordered and referrals given as necessary.  Abnormal results of tests will be discussed with her when all of her results are completed.  Routine preventative health maintenance measures emphasized: Exercise/Diet/Weight control, Tobacco Warnings, Alcohol/Substance use risks and Stress Management 2.  Recommend annual mammography based on family history. Orders Orders Placed This Encounter  Procedures  . MM DIGITAL SCREENING BILATERAL  . Flu Vaccine QUAD 36+ mos IM    No orders of the defined types were placed in this encounter.         F/U  No follow-ups on file.  Finis Bud, M.D. 11/26/2019 2:18 PM

## 2020-02-26 ENCOUNTER — Other Ambulatory Visit: Payer: Self-pay

## 2020-02-26 ENCOUNTER — Ambulatory Visit
Admission: RE | Admit: 2020-02-26 | Discharge: 2020-02-26 | Disposition: A | Discharge status: Home / Self Care | Payer: BC Managed Care – PPO | Source: Ambulatory Visit | Attending: Obstetrics and Gynecology | Admitting: Obstetrics and Gynecology

## 2020-02-26 DIAGNOSIS — Z1231 Encounter for screening mammogram for malignant neoplasm of breast: Secondary | ICD-10-CM | POA: Diagnosis present

## 2020-02-26 DIAGNOSIS — Z803 Family history of malignant neoplasm of breast: Secondary | ICD-10-CM | POA: Diagnosis not present

## 2020-04-22 ENCOUNTER — Ambulatory Visit (INDEPENDENT_AMBULATORY_CARE_PROVIDER_SITE_OTHER): Payer: Self-pay | Admitting: Obstetrics and Gynecology

## 2020-04-22 ENCOUNTER — Encounter: Payer: Self-pay | Admitting: Obstetrics and Gynecology

## 2020-04-22 ENCOUNTER — Other Ambulatory Visit: Payer: Self-pay

## 2020-04-22 VITALS — BP 110/76 | HR 88 | Ht 67.0 in | Wt 153.7 lb

## 2020-04-22 DIAGNOSIS — Z30430 Encounter for insertion of intrauterine contraceptive device: Secondary | ICD-10-CM

## 2020-04-22 LAB — POCT URINE PREGNANCY: Preg Test, Ur: NEGATIVE

## 2020-04-22 NOTE — Progress Notes (Signed)
HPI:      Bethany Harrison is a 35 y.o. G0P0 who LMP was Patient's last menstrual period was 04/12/2020.  Subjective:   She presents today for IUD insertion.  She has had problems with infertility and at this time because her periods are heavy with cramping she has decided to go forward with IUD both for cycle and birth control. She previously had an IUD and was happy with it. Patient has a history of endometriosis and previous laparoscopy for endometriosis. She has not had intercourse since her last menstrual period.    Hx: The following portions of the patient's history were reviewed and updated as appropriate:             She  has a past medical history of Abdominal pain, Anxiety, Depression, Endometriosis, GERD (gastroesophageal reflux disease), Headache, Hemorrhoid, Kidney stone, and Ulcerative colitis ([place]). She does not have any pertinent problems on file. She  has a past surgical history that includes Bunionectomy (08, 10); Tonsillectomy; Dilation and curettage of uterus (2013); Appendectomy (2013); Pelvic laparoscopy (2009); Colonoscopy (2014); Intrauterine device insertion (06/24/2011); Tonsillectomy; Diagnostic laparoscopy; Hernia repair (89); Chromopertubation (N/A, 06/30/2014); laparoscopy (N/A, 06/30/2014); IUD removal (N/A, 06/30/2014); and Breast cyst excision (Right, 2017). Her family history includes Breast cancer (age of onset: 73) in her mother; Cancer in her mother and paternal grandmother; Diabetes in her maternal grandmother, mother, and paternal grandmother. She  reports that she has never smoked. She has never used smokeless tobacco. She reports current alcohol use. She reports that she does not use drugs. She has a current medication list which includes the following prescription(s): alprazolam, fluticasone, nortriptyline, ubrogepant, and venlafaxine. She is allergic to sulfa antibiotics.       Review of Systems:  Review of Systems  Constitutional: Denied constitutional  symptoms, night sweats, recent illness, fatigue, fever, insomnia and weight loss.  Eyes: Denied eye symptoms, eye pain, photophobia, vision change and visual disturbance.  Ears/Nose/Throat/Neck: Denied ear, nose, throat or neck symptoms, hearing loss, nasal discharge, sinus congestion and sore throat.  Cardiovascular: Denied cardiovascular symptoms, arrhythmia, chest pain/pressure, edema, exercise intolerance, orthopnea and palpitations.  Respiratory: Denied pulmonary symptoms, asthma, pleuritic pain, productive sputum, cough, dyspnea and wheezing.  Gastrointestinal: Denied, gastro-esophageal reflux, melena, nausea and vomiting.  Genitourinary: Denied genitourinary symptoms including symptomatic vaginal discharge, pelvic relaxation issues, and urinary complaints.  Musculoskeletal: Denied musculoskeletal symptoms, stiffness, swelling, muscle weakness and myalgia.  Dermatologic: Denied dermatology symptoms, rash and scar.  Neurologic: Denied neurology symptoms, dizziness, headache, neck pain and syncope.  Psychiatric: Denied psychiatric symptoms, anxiety and depression.  Endocrine: Denied endocrine symptoms including hot flashes and night sweats.   Meds:   Current Outpatient Medications on File Prior to Visit  Medication Sig Dispense Refill  . ALPRAZolam (XANAX) 0.25 MG tablet Take by mouth.    . fluticasone (FLONASE) 50 MCG/ACT nasal spray Place 1 spray into both nostrils daily. ENT/ otc    . nortriptyline (PAMELOR) 10 MG capsule Take 10 mg nightly for a week, then increase to 20 mg nightly for a week, then increase to 30 mg nightly.    Marland Kitchen Ubrogepant 50 MG TABS Take by mouth.    . venlafaxine (EFFEXOR) 37.5 MG tablet Take by mouth.     No current facility-administered medications on file prior to visit.    Objective:     Vitals:   04/22/20 1423  BP: 110/76  Pulse: 88    Physical examination   Pelvic:   Vulva: Normal appearance.  No  lesions.  Vagina: No lesions or abnormalities  noted.  Support: Normal pelvic support.  Urethra No masses tenderness or scarring.  Meatus Normal size without lesions or prolapse.  Cervix: Normal appearance.  No lesions.  Anus: Normal exam.  No lesions.  Perineum: Normal exam.  No lesions.        Bimanual   Uterus: Normal size.  Non-tender.  Mobile.  AV.  Adnexae: No masses.  Non-tender to palpation.  Cul-de-sac: Negative for abnormality.   IUD Procedure Pt has read the booklet and signed the appropriate forms regarding the Mirena IUD.  All of her questions have been answered.   The cervix was cleansed with betadine solution.  After sounding the uterus and noting the position, the IUD was placed in the usual manner without problem.  The string was cut to the appropriate length.  The patient tolerated the procedure well. [place] # = [id number]             urinary beta-hCG negative prior to procedure.    Assessment:    G0P0 Patient Active Problem List   Diagnosis Date Noted  . Anxiety and depression 03/06/2018  . Ulcerative colitis ([place]) 10/05/2017  . Headache disorder 02/02/2017  . [place] 09/22/2016  . Migraine without aura and without status migrainosus, not intractable 05/17/2016  . Seasonal allergic rhinitis 05/17/2016  . Hx of migraine headaches 03/23/2016  . Pelvic pain in female 03/23/2016  . Interstitial cystitis 09/04/2014  . Endometriosis 06/30/2014  . Abdominal pain      1. Encounter for insertion of mirena IUD       Plan:             F/U  Return in about 4 weeks (around 05/20/2020) for For IUD f/u.  Finis Bud, M.D. 04/22/2020 2:54 PM

## 2020-05-20 ENCOUNTER — Encounter: Payer: Self-pay | Admitting: Obstetrics and Gynecology

## 2020-05-27 ENCOUNTER — Ambulatory Visit (INDEPENDENT_AMBULATORY_CARE_PROVIDER_SITE_OTHER): Payer: 59 | Admitting: Family Medicine

## 2020-05-27 ENCOUNTER — Encounter: Payer: Self-pay | Admitting: Family Medicine

## 2020-05-27 ENCOUNTER — Other Ambulatory Visit: Payer: Self-pay

## 2020-05-27 VITALS — BP 116/83 | HR 62 | Temp 98.5°F | Ht 66.3 in | Wt 153.0 lb

## 2020-05-27 DIAGNOSIS — K5180 Other ulcerative colitis without complications: Secondary | ICD-10-CM | POA: Diagnosis not present

## 2020-05-27 DIAGNOSIS — F339 Major depressive disorder, recurrent, unspecified: Secondary | ICD-10-CM

## 2020-05-27 DIAGNOSIS — G43009 Migraine without aura, not intractable, without status migrainosus: Secondary | ICD-10-CM

## 2020-05-27 MED ORDER — SERTRALINE HCL 100 MG PO TABS: ORAL_TABLET | ORAL | 3 refills | Status: DC

## 2020-05-27 NOTE — Assessment & Plan Note (Signed)
Not under good control. Will change her back to her sertraline and recheck 1 month. Call with any concerns.

## 2020-05-27 NOTE — Assessment & Plan Note (Signed)
Following with neurology. Still not under great control. Continue to follow with them. Call with any concerns.

## 2020-05-27 NOTE — Assessment & Plan Note (Signed)
Has been in remission for quite a while. Follows with GI. Continue to monitor. Call with any concerns. Colonoscopy up to date.

## 2020-05-27 NOTE — Patient Instructions (Signed)
Take 1/2 tab of effexor 2x daily or 1 tab qHS for 1 week with the 1/2 tab sertraline- then stop effexor.

## 2020-05-27 NOTE — Progress Notes (Signed)
BP 116/83   Pulse 62   Temp 98.5 F (36.9 C)   Ht 5' 6.3" (1.684 m)   Wt 153 lb (69.4 kg)   LMP 05/16/2020   SpO2 100%   BMI 24.47 kg/m    Subjective:    Patient ID: Bethany Harrison, female    DOB: 1985/05/19, 35 y.o.   MRN: [id number]  HPI: Bethany Harrison is a 35 y.o. female  Chief Complaint  Patient presents with  . New Patient (Initial Visit)    To est care.   . Anxiety  . Depression   Has been seeing neurology for migraines. Neurology changed her sertraline to nortriptyline and effexor. She had to stop her nortriptyline due to constipation. Now on topamax, but her mood has not been doing well with the change in her medicine.  DEPRESSION Mood status: exacerbated Satisfied with current treatment?: no Symptom severity: moderate  Duration of current treatment : about a month Side effects: no Medication compliance: excellent compliance Psychotherapy/counseling: no  Previous psychiatric medications: sertraline, effexor Depressed mood: yes Anxious mood: yes Anhedonia: no Significant weight loss or gain: no Insomnia: no  Fatigue: yes Feelings of worthlessness or guilt: yes Impaired concentration/indecisiveness: yes Suicidal ideations: no Hopelessness: no Crying spells: no Depression screen [facility] 2/9 05/27/2020 10/27/2016  Decreased Interest 1 0  Down, Depressed, Hopeless 1 0  PHQ - 2 Score 2 0  Altered sleeping 1 0  Tired, decreased energy 1 0  Change in appetite 1 0  Feeling bad or failure about yourself  0 0  Trouble concentrating 0 0  Moving slowly or fidgety/restless 2 0  Suicidal thoughts 0 0  PHQ-9 Score 7 0    Active Ambulatory Problems    Diagnosis Date Noted  . Endometriosis 06/30/2014  . Interstitial cystitis 09/04/2014  . Ulcerative colitis ([place]) 10/05/2017  . Depression, recurrent ([place]) 03/06/2018  . Migraine without aura and without status migrainosus, not intractable 05/17/2016  . Seasonal allergic rhinitis 05/17/2016   Resolved Ambulatory  Problems    Diagnosis Date Noted  . Hemorrhoid   . Abdominal pain   . Hx of migraine headaches 03/23/2016  . Pelvic pain in female 03/23/2016  . [place]merz 09/22/2016  . Headache disorder 02/02/2017   Past Medical History:  Diagnosis Date  . Anxiety   . Chronic kidney disease   . Depression   . GERD (gastroesophageal reflux disease)   . Headache   . Migraine    Past Surgical History:  Procedure Laterality Date  . APPENDECTOMY  2013   with laparoscopy @ [facility]  . BREAST CYST EXCISION Right 2017   sebaceous cyst removed  . BUNIONECTOMY  08, 10  . CHROMOPERTUBATION N/A 06/30/2014   Procedure: CHROMOPERTUBATION;  Surgeon: [name], MD;  Location: [facility] ORS;  Service: Gynecology;  Laterality: N/A;  . COLONOSCOPY  2014  . DIAGNOSTIC LAPAROSCOPY    . DILATION AND CURETTAGE OF UTERUS  2013   polyp  . HERNIA REPAIR  89   umbilical  . INTRAUTERINE DEVICE INSERTION  06/24/2011   Mirena IUD  . IUD REMOVAL N/A 06/30/2014   Procedure: INTRAUTERINE DEVICE (IUD) REMOVAL;  Surgeon: [name], MD;  Location: [facility] ORS;  Service: Gynecology;  Laterality: N/A;  . LAPAROSCOPY N/A 06/30/2014   Procedure:  with excision of endoemtriosis and biopsy;  Surgeon: [name], MD;  Location: [facility] ORS;  Service: Gynecology;  Laterality: N/A;  . PELVIC LAPAROSCOPY  2009   times 2  .  TONSILLECTOMY    . TONSILLECTOMY     Outpatient Encounter Medications as of 05/27/2020  Medication Sig  . ALPRAZolam (XANAX) 0.25 MG tablet Take by mouth.  . butalbital-acetaminophen-caffeine (FIORICET) 50-325-40 MG tablet Take by mouth.  . sertraline (ZOLOFT) 100 MG tablet 1/2 tab for 1 week, then increase to 1 tab daily  . topiramate (TOPAMAX) 50 MG tablet Take 1/2 tablet (42m) nightly for one week, then increase to 1 tablet (540m nightly.  . Triamcinolone Acetonide (NASACORT ALLERGY 24HR NA) Place into the nose.  . [DISCONTINUED] venlafaxine (EFFEXOR) 37.5 MG tablet Take by mouth.   . Ubrogepant 50 MG TABS Take by mouth. (Patient not taking: Reported on 05/27/2020)  . [DISCONTINUED] fluticasone (FLONASE) 50 MCG/ACT nasal spray Place 1 spray into both nostrils daily. ENT/ otc (Patient not taking: Reported on 05/27/2020)  . [DISCONTINUED] nortriptyline (PAMELOR) 10 MG capsule Take 10 mg nightly for a week, then increase to 20 mg nightly for a week, then increase to 30 mg nightly. (Patient not taking: Reported on 05/27/2020)   No facility-administered encounter medications on file as of 05/27/2020.   Allergies  Allergen Reactions  . Sulfa Antibiotics Nausea Only   Social History   Socioeconomic History  . Marital status: Married    Spouse name: Not on file  . Number of children: Not on file  . Years of education: Not on file  . Highest education level: Not on file  Occupational History  . Occupation: HR    Employer: [employer]  Tobacco Use  . Smoking status: Never Smoker  . Smokeless tobacco: Never Used  Vaping Use  . Vaping Use: Never used  Substance and Sexual Activity  . Alcohol use: Yes    Alcohol/week: 0.0 standard drinks    Comment: occas  . Drug use: No  . Sexual activity: Yes    Partners: Male    Birth control/protection: I.U.D.  Other Topics Concern  . Not on file  Social History Narrative  . Not on file   Social Determinants of Health   Financial Resource Strain: Not on file  Food Insecurity: Not on file  Transportation Needs: Not on file  Physical Activity: Not on file  Stress: Not on file  Social Connections: Not on file   Family History  Problem Relation Age of Onset  . Breast cancer Mother [id number]. Cancer Mother   . Diabetes Mother   . Cancer Paternal Grandmother        colon  . Diabetes Paternal Grandmother   . Diabetes Maternal Grandmother   . Brain cancer Maternal Grandfather   . Heart disease Neg Hx   . Ovarian cancer Neg Hx    Review of Systems  Constitutional: Negative.   Respiratory: Negative.   Cardiovascular:  Negative.   Gastrointestinal: Negative.   Musculoskeletal: Negative.   Skin: Negative.   Neurological: Positive for dizziness and headaches. Negative for tremors, seizures, syncope, facial asymmetry, speech difficulty, weakness, light-headedness and numbness.  Psychiatric/Behavioral: Positive for dysphoric mood. Negative for agitation, behavioral problems, confusion, decreased concentration, hallucinations, self-injury, sleep disturbance and suicidal ideas. The patient is nervous/anxious. The patient is not hyperactive.     Per HPI unless specifically indicated above     Objective:    BP 116/83   Pulse 62   Temp 98.5 F (36.9 C)   Ht 5' 6.3" (1.684 m)   Wt 153 lb (69.4 kg)   LMP 05/16/2020   SpO2 100%   BMI 24.47 kg/m  Wt Readings from Last 3 Encounters:  05/27/20 153 lb (69.4 kg)  04/22/20 153 lb 11.2 oz (69.7 kg)  11/26/19 147 lb 9.6 oz (67 kg)    Physical Exam Vitals and nursing note reviewed.  Constitutional:      General: She is not in acute distress.    Appearance: Normal appearance. She is not ill-appearing, toxic-appearing or diaphoretic.  HENT:     Head: Normocephalic and atraumatic.     Right Ear: External ear normal.     Left Ear: External ear normal.     Nose: Nose normal.     Mouth/Throat:     Mouth: Mucous membranes are moist.     Pharynx: Oropharynx is clear.  Eyes:     General: No scleral icterus.       Right eye: No discharge.        Left eye: No discharge.     Extraocular Movements: Extraocular movements intact.     Conjunctiva/sclera: Conjunctivae normal.     Pupils: Pupils are equal, round, and reactive to light.  Cardiovascular:     Rate and Rhythm: Normal rate and regular rhythm.     Pulses: Normal pulses.     Heart sounds: Normal heart sounds. No murmur heard. No friction rub. No gallop.   Pulmonary:     Effort: Pulmonary effort is normal. No respiratory distress.     Breath sounds: Normal breath sounds. No stridor. No wheezing, rhonchi  or rales.  Chest:     Chest wall: No tenderness.  Musculoskeletal:        General: Normal range of motion.     Cervical back: Normal range of motion and neck supple.  Skin:    General: Skin is warm and dry.     Capillary Refill: Capillary refill takes less than 2 seconds.     Coloration: Skin is not jaundiced or pale.     Findings: No bruising, erythema, lesion or rash.  Neurological:     General: No focal deficit present.     Mental Status: She is alert and oriented to person, place, and time. Mental status is at baseline.  Psychiatric:        Mood and Affect: Mood normal.        Behavior: Behavior normal.        Thought Content: Thought content normal.        Judgment: Judgment normal.     Results for orders placed or performed in visit on 04/22/20  POCT urine pregnancy  Result Value Ref Range   Preg Test, Ur Negative Negative      Assessment & Plan:   Problem List Items Addressed This Visit      Cardiovascular and Mediastinum   Migraine without aura and without status migrainosus, not intractable - Primary    Following with neurology. Still not under great control. Continue to follow with them. Call with any concerns.       Relevant Medications   topiramate (TOPAMAX) 50 MG tablet   butalbital-acetaminophen-caffeine (FIORICET) 50-325-40 MG tablet   sertraline (ZOLOFT) 100 MG tablet     Digestive   Ulcerative colitis ([place])    Has been in remission for quite a while. Follows with GI. Continue to monitor. Call with any concerns. Colonoscopy up to date.         Other   Depression, recurrent ([place])    Not under good control. Will change her back to her sertraline and recheck 1 month. Call with any concerns.  Relevant Medications   sertraline (ZOLOFT) 100 MG tablet       Follow up plan: Return in about 4 weeks (around 06/24/2020).

## 2020-05-28 ENCOUNTER — Other Ambulatory Visit: Payer: Self-pay

## 2020-05-28 ENCOUNTER — Ambulatory Visit (INDEPENDENT_AMBULATORY_CARE_PROVIDER_SITE_OTHER): Payer: 59 | Admitting: Obstetrics and Gynecology

## 2020-05-28 ENCOUNTER — Encounter: Payer: Self-pay | Admitting: Obstetrics and Gynecology

## 2020-05-28 VITALS — BP 113/78 | HR 92 | Ht 66.5 in | Wt 154.2 lb

## 2020-05-28 DIAGNOSIS — Z30431 Encounter for routine checking of intrauterine contraceptive device: Secondary | ICD-10-CM | POA: Diagnosis not present

## 2020-05-28 NOTE — Progress Notes (Signed)
HPI:      [name] is a 35 y.o. G0P0 who LMP was Patient's last menstrual period was 05/16/2020.  Subjective:   She presents today for IUD follow-up.  She reports that she continues to have some spotting but it is not heavy.  She had the IUD placed for birth control and cycle control as well as dysmenorrhea.  (She previously successfully had an IUD.)She is doing well so far.  She has resumed intercourse without issue.    Hx: The following portions of the patient's history were reviewed and updated as appropriate:             She  has a past medical history of Abdominal pain, Anxiety, Chronic kidney disease, Depression, Endometriosis, Endometriosis, GERD (gastroesophageal reflux disease), Headache, Hemorrhoid, Migraine, and Ulcerative colitis ([name]). She does not have any pertinent problems on file. She  has a past surgical history that includes Bunionectomy (08, 10); Tonsillectomy; Dilation and curettage of uterus (2013); Appendectomy (2013); Pelvic laparoscopy (2009); Colonoscopy (2014); Intrauterine device insertion (06/24/2011); Tonsillectomy; Diagnostic laparoscopy; Hernia repair (89); Chromopertubation (N/A, 06/30/2014); laparoscopy (N/A, 06/30/2014); IUD removal (N/A, 06/30/2014); and Breast cyst excision (Right, 2017). Her family history includes Brain cancer in her maternal grandfather; Breast cancer (age of onset: 82) in her mother; Cancer in her mother and paternal grandmother; Diabetes in her maternal grandmother, mother, and paternal grandmother. She  reports that she has never smoked. She has never used smokeless tobacco. She reports current alcohol use. She reports that she does not use drugs. She has a current medication list which includes the following prescription(s): alprazolam, butalbital-acetaminophen-caffeine, sertraline, topiramate, triamcinolone acetonide, and ubrogepant. She is allergic to sulfa antibiotics.       Review of Systems:  Review of Systems  Constitutional:  Denied constitutional symptoms, night sweats, recent illness, fatigue, fever, insomnia and weight loss.  Eyes: Denied eye symptoms, eye pain, photophobia, vision change and visual disturbance.  Ears/Nose/Throat/Neck: Denied ear, nose, throat or neck symptoms, hearing loss, nasal discharge, sinus congestion and sore throat.  Cardiovascular: Denied cardiovascular symptoms, arrhythmia, chest pain/pressure, edema, exercise intolerance, orthopnea and palpitations.  Respiratory: Denied pulmonary symptoms, asthma, pleuritic pain, productive sputum, cough, dyspnea and wheezing.  Gastrointestinal: Denied, gastro-esophageal reflux, melena, nausea and vomiting.  Genitourinary: Denied genitourinary symptoms including symptomatic vaginal discharge, pelvic relaxation issues, and urinary complaints.  Musculoskeletal: Denied musculoskeletal symptoms, stiffness, swelling, muscle weakness and myalgia.  Dermatologic: Denied dermatology symptoms, rash and scar.  Neurologic: Denied neurology symptoms, dizziness, headache, neck pain and syncope.  Psychiatric: Denied psychiatric symptoms, anxiety and depression.  Endocrine: Denied endocrine symptoms including hot flashes and night sweats.   Meds:   Current Outpatient Medications on File Prior to Visit  Medication Sig Dispense Refill  . ALPRAZolam (XANAX) 0.25 MG tablet Take by mouth.    . butalbital-acetaminophen-caffeine (FIORICET) 50-325-40 MG tablet Take by mouth.    . sertraline (ZOLOFT) 100 MG tablet 1/2 tab for 1 week, then increase to 1 tab daily 30 tablet 3  . topiramate (TOPAMAX) 50 MG tablet Take 1/2 tablet (66m) nightly for one week, then increase to 1 tablet (562m nightly.    . Triamcinolone Acetonide (NASACORT ALLERGY 24HR NA) Place into the nose.    . Marland Kitchenbrogepant 50 MG TABS Take by mouth. (Patient not taking: Reported on 05/27/2020)     No current facility-administered medications on file prior to visit.      The pregnancy intention screening data  noted above was reviewed. Potential methods of contraception were discussed.  The patient elected to proceed with IUD or IUS.     Objective:     Vitals:   05/28/20 1453  BP: 113/78  Pulse: 92   Filed Weights   05/28/20 1453  Weight: 154 lb 3.2 oz (69.9 kg)              Physical examination   Pelvic:   Vulva: Normal appearance.  No lesions.  Vagina: No lesions or abnormalities noted.  Support: Normal pelvic support.  Urethra No masses tenderness or scarring.  Meatus Normal size without lesions or prolapse.  Cervix: Normal appearance.  No lesions. IUD strings noted at cervical os.  Anus: Normal exam.  No lesions.  Perineum: Normal exam.  No lesions.        Bimanual   Uterus: Normal size.  Non-tender.  Mobile.  AV.  Adnexae: No masses.  Non-tender to palpation.  Cul-de-sac: Negative for abnormality.     Assessment:    G0P0 Patient Active Problem List   Diagnosis Date Noted  . Depression, recurrent ([place]) 03/06/2018  . Ulcerative colitis ([place]) 10/05/2017  . Migraine without aura and without status migrainosus, not intractable 05/17/2016  . Seasonal allergic rhinitis 05/17/2016  . Interstitial cystitis 09/04/2014  . Endometriosis 06/30/2014     1. Surveillance of previously prescribed intrauterine contraceptive device     Patient doing well with IUD.-No issues   Plan:            1.  Expect no issues with IUD.  Patient will follow-up for annual examination. Orders No orders of the defined types were placed in this encounter.   No orders of the defined types were placed in this encounter.     F/U  Return for Annual Physical. I spent 18 minutes involved in the care of this patient preparing to see the patient by obtaining and reviewing her medical history (including labs, imaging tests and prior procedures), documenting clinical information in the electronic health record (EHR), counseling and coordinating care plans, writing and sending prescriptions, ordering  tests or procedures and directly communicating with the patient by discussing pertinent items from her history and physical exam as well as detailing my assessment and plan as noted above so that she has an informed understanding.  All of her questions were answered.  Finis Bud, M.D. 05/28/2020 3:26 PM

## 2020-06-24 ENCOUNTER — Other Ambulatory Visit: Payer: Self-pay | Admitting: Family Medicine

## 2020-06-24 NOTE — Telephone Encounter (Signed)
   Notes to clinic:  requesting 90 day with 2 refills  Review for change    Requested Prescriptions  Pending Prescriptions Disp Refills   sertraline (ZOLOFT) 100 MG tablet [Pharmacy Med Name: SERTRALINE HCL 100 MG TABLET] 90 tablet 2    Sig: 1/2 tab for 1 week, then increase to 1 tab daily      Psychiatry:  Antidepressants - SSRI Passed - 06/24/2020  9:31 AM      Passed - Completed PHQ-2 or PHQ-9 in the last 360 days      Passed - Valid encounter within last 6 months    Recent Outpatient Visits           4 weeks ago Migraine without aura and without status migrainosus, not intractable   [place], Megan P, DO   3 years ago Establishing care with new doctor, encounter for   [facility] Juline Patch, MD       Future Appointments             Tomorrow Valerie Roys, DO [place], [place]   In 5 months Amalia Hailey, Nyoka Lint, MD [facility]

## 2020-06-25 ENCOUNTER — Other Ambulatory Visit: Payer: Self-pay

## 2020-06-25 ENCOUNTER — Encounter: Payer: Self-pay | Admitting: Family Medicine

## 2020-06-25 ENCOUNTER — Ambulatory Visit: Payer: 59 | Admitting: Family Medicine

## 2020-06-25 VITALS — BP 127/81 | HR 71 | Temp 98.0°F | Ht 65.5 in | Wt 148.0 lb

## 2020-06-25 DIAGNOSIS — M26609 Unspecified temporomandibular joint disorder, unspecified side: Secondary | ICD-10-CM

## 2020-06-25 DIAGNOSIS — F339 Major depressive disorder, recurrent, unspecified: Secondary | ICD-10-CM

## 2020-06-25 MED ORDER — CYCLOBENZAPRINE HCL 10 MG PO TABS: 10.0000 mg | ORAL_TABLET | Freq: Every day | ORAL | 3 refills | Status: DC

## 2020-06-25 NOTE — Progress Notes (Signed)
BP 127/81   Pulse 71   Temp 98 F (36.7 C)   Ht 5' 5.5" (1.664 m)   Wt 148 lb (67.1 kg)   SpO2 99%   BMI 24.25 kg/m    Subjective:    Patient ID: Bethany Harrison, female    DOB: [birth year], 35 y.o.   MRN: [id number]  HPI: Bethany Harrison is a 35 y.o. female  Chief Complaint  Patient presents with   Depression   Jaw Pain    Patient states she has had jaw pain for many years, seems to be getting worse in the past two months. Patient states she can not eat meat anymore.    DEPRESSION- feels like her anxiety is good, but her mood is not great Mood status: uncontrolled Satisfied with current treatment?:  unsure Symptom severity: moderate  Duration of current treatment : months Side effects: no Medication compliance: excellent compliance Psychotherapy/counseling: no  Previous psychiatric medications: sertraline Depressed mood: yes Anxious mood: no Anhedonia: no Significant weight loss or gain: no Insomnia: no  Fatigue: yes Feelings of worthlessness or guilt: no Impaired concentration/indecisiveness: no Suicidal ideations: no Hopelessness: no Crying spells: no Depression screen [facility] 2/9 06/25/2020 05/27/2020 10/27/2016  Decreased Interest 2 1 0  Down, Depressed, Hopeless 2 1 0  PHQ - 2 Score 4 2 0  Altered sleeping 2 1 0  Tired, decreased energy 2 1 0  Change in appetite 0 1 0  Feeling bad or failure about yourself  0 0 0  Trouble concentrating 0 0 0  Moving slowly or fidgety/restless 0 2 0  Suicidal thoughts 0 0 0  PHQ-9 Score 8 7 0  Difficult doing work/chores Not difficult at all - -   GAD 7 : Generalized Anxiety Score 05/27/2020  Nervous, Anxious, on Edge 2  Control/stop worrying 2  Worry too much - different things 2  Trouble relaxing 2  Restless 2  Easily annoyed or irritable 2  Afraid - awful might happen 2  Total GAD 7 Score 14   Has been having a lot of jaw pain. She sees dentristry and has a mouth guard, but it's been getting worse, she has been stopping  eating certain foods like meat and has been having trouble sleeping. No other concerns or complaints at this time.   Relevant past medical, surgical, family and social history reviewed and updated as indicated. Interim medical history since our last visit reviewed. Allergies and medications reviewed and updated.  Review of Systems  Constitutional: Negative.   Respiratory: Negative.    Cardiovascular: Negative.   Psychiatric/Behavioral:  Positive for dysphoric mood and sleep disturbance. Negative for agitation, behavioral problems, confusion, decreased concentration, hallucinations and self-injury. The patient is not nervous/anxious and is not hyperactive.    Per HPI unless specifically indicated above     Objective:    BP 127/81   Pulse 71   Temp 98 F (36.7 C)   Ht 5' 5.5" (1.664 m)   Wt 148 lb (67.1 kg)   SpO2 99%   BMI 24.25 kg/m   Wt Readings from Last 3 Encounters:  06/25/20 148 lb (67.1 kg)  05/28/20 154 lb 3.2 oz (69.9 kg)  05/27/20 153 lb (69.4 kg)    Physical Exam Vitals and nursing note reviewed.  Constitutional:      General: She is not in acute distress.    Appearance: Normal appearance. She is not ill-appearing, toxic-appearing or diaphoretic.  HENT:     Head: Normocephalic and atraumatic.  Right Ear: External ear normal.     Left Ear: External ear normal.     Nose: Nose normal.     Mouth/Throat:     Mouth: Mucous membranes are moist.     Pharynx: Oropharynx is clear.  Eyes:     General: No scleral icterus.       Right eye: No discharge.        Left eye: No discharge.     Extraocular Movements: Extraocular movements intact.     Conjunctiva/sclera: Conjunctivae normal.     Pupils: Pupils are equal, round, and reactive to light.  Cardiovascular:     Rate and Rhythm: Normal rate and regular rhythm.     Pulses: Normal pulses.     Heart sounds: Normal heart sounds. No murmur heard.   No friction rub. No gallop.  Pulmonary:     Effort: Pulmonary  effort is normal. No respiratory distress.     Breath sounds: Normal breath sounds. No stridor. No wheezing, rhonchi or rales.  Chest:     Chest wall: No tenderness.  Musculoskeletal:        General: Normal range of motion.     Cervical back: Normal range of motion and neck supple.  Skin:    General: Skin is warm and dry.     Capillary Refill: Capillary refill takes less than 2 seconds.     Coloration: Skin is not jaundiced or pale.     Findings: No bruising, erythema, lesion or rash.  Neurological:     General: No focal deficit present.     Mental Status: She is alert and oriented to person, place, and time. Mental status is at baseline.  Psychiatric:        Mood and Affect: Mood normal.        Behavior: Behavior normal.        Thought Content: Thought content normal.        Judgment: Judgment normal.    Results for orders placed or performed in visit on 04/22/20  POCT urine pregnancy  Result Value Ref Range   Preg Test, Ur Negative Negative      Assessment & Plan:   Problem List Items Addressed This Visit       Other   Depression, recurrent ([place])    Anxiety is feeling better, mood is not- will give her sertraline a little more time to work and recheck 2 weeks by [employer]. If still not there will increase her dose. If not, we will leave her where she is. Call with any concerns.        Other Visit Diagnoses     TMJ (temporomandibular joint syndrome)    -  Primary   Will start exercises and flexeril. Call with any concerns or if not getting better.         Follow up plan: Return if symptoms worsen or fail to improve.

## 2020-06-25 NOTE — Assessment & Plan Note (Signed)
Anxiety is feeling better, mood is not- will give her sertraline a little more time to work and recheck 2 weeks by [employer]. If still not there will increase her dose. If not, we will leave her where she is. Call with any concerns.

## 2020-07-10 ENCOUNTER — Encounter: Payer: Self-pay | Admitting: Family Medicine

## 2020-07-10 DIAGNOSIS — M26609 Unspecified temporomandibular joint disorder, unspecified side: Secondary | ICD-10-CM

## 2020-07-24 MED ORDER — NORTRIPTYLINE HCL 25 MG PO CAPS: 25.0000 mg | ORAL_CAPSULE | Freq: Every day | ORAL | 2 refills | Status: DC

## 2020-07-24 NOTE — Telephone Encounter (Signed)
Please schedule 1 month follow up on jaw

## 2020-08-05 ENCOUNTER — Telehealth: Payer: Self-pay

## 2020-08-05 NOTE — Telephone Encounter (Signed)
Copied from [place] [id number]. Topic: Referral - Status >> Aug 05, 2020 12:40 PM Yvette Rack wrote: Reason for CRM: Pt requests call back in regards to referral request. Pt stated she spoke with Dr. Durenda Age nurse who is aware of her referral request. Cb# [phone]

## 2020-08-06 NOTE — Telephone Encounter (Signed)
Patient sent in MyChart to see if we could resend referral to [facility] at [facility]. Patient states she contacted them and they do accept her insurance. Patient was recently sent to a place in [employer] but they were not in network with her insurance.

## 2020-08-20 ENCOUNTER — Other Ambulatory Visit: Payer: Self-pay | Admitting: Family Medicine

## 2020-08-20 NOTE — Telephone Encounter (Signed)
Requested medication (s) are due for refill today:   Yes for Zoloft, No for Pamelor  Requested medication (s) are on the active medication list:   Yes for both  Future visit scheduled:   No   Last ordered: Zoloft 05/27/2020 #30, 3 refills;    Pamelor 07/24/2020 #30, 2 refills  Returned because pharmacy requesting a 90 day supply for both.   Requested Prescriptions  Pending Prescriptions Disp Refills   sertraline (ZOLOFT) 100 MG tablet [Pharmacy Med Name: SERTRALINE HCL 100 MG TABLET] 90 tablet 2    Sig: 1/2 tab for 1 week, then increase to 1 tab daily      Psychiatry:  Antidepressants - SSRI Passed - 08/20/2020  8:32 AM      Passed - Completed PHQ-2 or PHQ-9 in the last 360 days      Passed - Valid encounter within last 6 months    Recent Outpatient Visits           1 month ago TMJ (temporomandibular joint syndrome)   [facility] [place], Megan P, DO   2 months ago Migraine without aura and without status migrainosus, not intractable   [place], Megan P, DO   3 years ago Establishing care with new doctor, encounter for   [place], MD       Future Appointments             In 3 months Amalia Hailey Nyoka Lint, MD [facility]               nortriptyline (PAMELOR) 25 MG capsule [Pharmacy Med Name: NORTRIPTYLINE HCL 25 MG CAP] 90 capsule 1    Sig: TAKE 1 CAPSULE BY MOUTH AT BEDTIME.      Psychiatry:  Antidepressants - Heterocyclics (TCAs) Passed - 08/20/2020  8:32 AM      Passed - Completed PHQ-2 or PHQ-9 in the last 360 days      Passed - Valid encounter within last 6 months    Recent Outpatient Visits           1 month ago TMJ (temporomandibular joint syndrome)   [facility] [place], Megan P, DO   2 months ago Migraine without aura and without status migrainosus, not intractable   [place], Megan P, DO   3 years ago Establishing care with new doctor, encounter  for   [place], MD       Future Appointments             In 3 months Amalia Hailey, Nyoka Lint, MD [facility]

## 2020-08-26 ENCOUNTER — Encounter: Payer: Self-pay | Admitting: Family Medicine

## 2020-08-27 ENCOUNTER — Encounter: Payer: Self-pay | Admitting: Family Medicine

## 2020-08-27 ENCOUNTER — Telehealth (INDEPENDENT_AMBULATORY_CARE_PROVIDER_SITE_OTHER): Payer: 59 | Admitting: Family Medicine

## 2020-08-27 DIAGNOSIS — U071 COVID-19: Secondary | ICD-10-CM | POA: Diagnosis not present

## 2020-08-27 MED ORDER — HYDROCOD POLST-CPM POLST ER 10-8 MG/5ML PO SUER: 5.0000 mL | Freq: Two times a day (BID) | ORAL | 0 refills | Status: DC | PRN

## 2020-08-27 MED ORDER — BENZONATATE 200 MG PO CAPS: 200.0000 mg | ORAL_CAPSULE | Freq: Two times a day (BID) | ORAL | 0 refills | Status: DC | PRN

## 2020-08-27 MED ORDER — PREDNISONE 50 MG PO TABS: 50.0000 mg | ORAL_TABLET | Freq: Every day | ORAL | 0 refills | Status: DC

## 2020-08-27 NOTE — Progress Notes (Signed)
There were no vitals taken for this visit.   Subjective:    Patient ID: Bethany Harrison, female    DOB: 13-May-1985, 35 y.o.   MRN: [id number]  HPI: Bethany Harrison is a 35 y.o. female  Chief Complaint  Patient presents with   Covid Positive    Pt states she tested positive for COVID on Monday, symptoms started on Saturday. Patient states she has been having congestion, headache, cough, and chest congestion.    UPPER RESPIRATORY TRACT INFECTION Duration: Saturday PM Worst symptom: head congestion Fever: chills and sweats Cough: yes Shortness of breath: yes Wheezing: no Chest pain: yes Chest tightness: yes Chest congestion: yes Nasal congestion: yes Runny nose: yes Post nasal drip: yes Sneezing: no Sore throat: no Swollen glands: yes Sinus pressure: yes Headache: no Face pain: no Toothache: no Ear pain: no  Ear pressure: yes bilateral Eyes red/itching:no Eye drainage/crusting: no  Vomiting: no Rash: no Fatigue: yes Sick contacts: yes Strep contacts: no  Context: stable Recurrent sinusitis: no Relief with OTC cold/cough medications: no  Treatments attempted: cold/sinus, mucinex, anti-histamine, and pseudoephedrine   Relevant past medical, surgical, family and social history reviewed and updated as indicated. Interim medical history since our last visit reviewed. Allergies and medications reviewed and updated.  Review of Systems  Constitutional:  Positive for chills, diaphoresis and fatigue. Negative for activity change, appetite change, fever and unexpected weight change.  HENT:  Positive for congestion, postnasal drip, rhinorrhea, sinus pressure, sinus pain and sore throat. Negative for dental problem, drooling, ear discharge, ear pain, facial swelling, hearing loss, mouth sores, nosebleeds, sneezing, tinnitus, trouble swallowing and voice change.   Eyes: Negative.   Respiratory:  Positive for cough, chest tightness and shortness of breath. Negative for apnea, choking,  wheezing and stridor.   Cardiovascular: Negative.   Gastrointestinal: Negative.   Skin: Negative.   Psychiatric/Behavioral: Negative.     Per HPI unless specifically indicated above     Objective:    There were no vitals taken for this visit.  Wt Readings from Last 3 Encounters:  06/25/20 148 lb (67.1 kg)  05/28/20 154 lb 3.2 oz (69.9 kg)  05/27/20 153 lb (69.4 kg)    Physical Exam Vitals and nursing note reviewed.  Constitutional:      General: She is not in acute distress.    Appearance: Normal appearance. She is not ill-appearing, toxic-appearing or diaphoretic.  HENT:     Head: Normocephalic and atraumatic.     Right Ear: External ear normal.     Left Ear: External ear normal.     Nose: Nose normal.     Mouth/Throat:     Mouth: Mucous membranes are moist.     Pharynx: Oropharynx is clear.  Eyes:     General: No scleral icterus.       Right eye: No discharge.        Left eye: No discharge.     Conjunctiva/sclera: Conjunctivae normal.     Pupils: Pupils are equal, round, and reactive to light.  Pulmonary:     Effort: Pulmonary effort is normal. No respiratory distress.     Comments: Speaking in full sentences Musculoskeletal:        General: Normal range of motion.     Cervical back: Normal range of motion.  Skin:    Coloration: Skin is not jaundiced or pale.     Findings: No bruising, erythema, lesion or rash.  Neurological:     Mental Status: She is  alert and oriented to person, place, and time. Mental status is at baseline.  Psychiatric:        Mood and Affect: Mood normal.        Behavior: Behavior normal.        Thought Content: Thought content normal.        Judgment: Judgment normal.    Results for orders placed or performed in visit on 04/22/20  POCT urine pregnancy  Result Value Ref Range   Preg Test, Ur Negative Negative      Assessment & Plan:   Problem List Items Addressed This Visit   None Visit Diagnoses     COVID-19    -  Primary    Outside the window for antiviral. Will treat with prednisone, tussionex and tessalon. Call with any concerns or if not getting better.         Follow up plan: Return if symptoms worsen or fail to improve.    This visit was completed via video visit through MyChart due to the restrictions of the COVID-19 pandemic. All issues as above were discussed and addressed. Physical exam was done as above through visual confirmation on video through MyChart. If it was felt that the patient should be evaluated in the office, they were directed there. The patient verbally consented to this visit. Location of the patient: home Location of the provider: work Those involved with this call:  Provider: Park Liter, DO CMA: Yvonna Alanis, [place] Desk/Registration: Roe Rutherford  Time spent on call:  15 minutes with patient face to face via video conference. More than 50% of this time was spent in counseling and coordination of care. 23 minutes total spent in review of patient's record and preparation of their chart.

## 2020-08-30 ENCOUNTER — Encounter: Payer: Self-pay | Admitting: Family Medicine

## 2020-08-31 ENCOUNTER — Other Ambulatory Visit: Payer: Self-pay | Admitting: Family Medicine

## 2020-08-31 DIAGNOSIS — R059 Cough, unspecified: Secondary | ICD-10-CM

## 2020-08-31 MED ORDER — AMOXICILLIN-POT CLAVULANATE 875-125 MG PO TABS: 1.0000 | ORAL_TABLET | Freq: Two times a day (BID) | ORAL | 0 refills | Status: DC

## 2020-09-02 ENCOUNTER — Ambulatory Visit
Admission: RE | Admit: 2020-09-02 | Discharge: 2020-09-02 | Disposition: A | Discharge status: Home / Self Care | Payer: 59 | Source: Ambulatory Visit | Attending: Family Medicine | Admitting: Family Medicine

## 2020-09-02 ENCOUNTER — Other Ambulatory Visit: Payer: Self-pay

## 2020-09-02 ENCOUNTER — Ambulatory Visit
Admission: RE | Admit: 2020-09-02 | Discharge: 2020-09-02 | Disposition: A | Discharge status: Home / Self Care | Payer: 59 | Attending: Family Medicine | Admitting: Family Medicine

## 2020-09-02 DIAGNOSIS — R059 Cough, unspecified: Secondary | ICD-10-CM | POA: Insufficient documentation

## 2020-09-03 ENCOUNTER — Telehealth: Payer: 59 | Admitting: Family Medicine

## 2020-09-14 ENCOUNTER — Encounter: Payer: Self-pay | Admitting: Family Medicine

## 2020-09-14 NOTE — Telephone Encounter (Signed)
Do you recommend patient schedule an appt with [place] for this week or go go urgent care

## 2020-10-23 ENCOUNTER — Other Ambulatory Visit: Payer: Self-pay | Admitting: Physician Assistant

## 2020-10-23 ENCOUNTER — Other Ambulatory Visit (HOSPITAL_BASED_OUTPATIENT_CLINIC_OR_DEPARTMENT_OTHER): Payer: Self-pay | Admitting: Physician Assistant

## 2020-10-23 DIAGNOSIS — R55 Syncope and collapse: Secondary | ICD-10-CM

## 2020-10-23 DIAGNOSIS — R110 Nausea: Secondary | ICD-10-CM

## 2020-10-23 DIAGNOSIS — G4484 Primary exertional headache: Secondary | ICD-10-CM

## 2020-10-23 DIAGNOSIS — R519 Headache, unspecified: Secondary | ICD-10-CM

## 2020-10-23 DIAGNOSIS — R42 Dizziness and giddiness: Secondary | ICD-10-CM

## 2020-10-23 DIAGNOSIS — H53149 Visual discomfort, unspecified: Secondary | ICD-10-CM

## 2020-11-02 ENCOUNTER — Ambulatory Visit (INDEPENDENT_AMBULATORY_CARE_PROVIDER_SITE_OTHER): Payer: 59 | Admitting: Podiatry

## 2020-11-02 ENCOUNTER — Encounter: Payer: Self-pay | Admitting: Podiatry

## 2020-11-02 ENCOUNTER — Other Ambulatory Visit: Payer: Self-pay

## 2020-11-02 DIAGNOSIS — M7671 Peroneal tendinitis, right leg: Secondary | ICD-10-CM | POA: Diagnosis not present

## 2020-11-02 DIAGNOSIS — L600 Ingrowing nail: Secondary | ICD-10-CM

## 2020-11-04 ENCOUNTER — Other Ambulatory Visit: Payer: Self-pay

## 2020-11-04 ENCOUNTER — Ambulatory Visit
Admission: RE | Admit: 2020-11-04 | Discharge: 2020-11-04 | Disposition: A | Discharge status: Home / Self Care | Payer: 59 | Source: Ambulatory Visit | Attending: Physician Assistant | Admitting: Physician Assistant

## 2020-11-04 DIAGNOSIS — H53149 Visual discomfort, unspecified: Secondary | ICD-10-CM | POA: Insufficient documentation

## 2020-11-04 DIAGNOSIS — R519 Headache, unspecified: Secondary | ICD-10-CM | POA: Diagnosis not present

## 2020-11-04 DIAGNOSIS — R55 Syncope and collapse: Secondary | ICD-10-CM | POA: Insufficient documentation

## 2020-11-04 DIAGNOSIS — R42 Dizziness and giddiness: Secondary | ICD-10-CM

## 2020-11-04 DIAGNOSIS — G4484 Primary exertional headache: Secondary | ICD-10-CM | POA: Diagnosis present

## 2020-11-04 DIAGNOSIS — R110 Nausea: Secondary | ICD-10-CM | POA: Diagnosis present

## 2020-11-04 NOTE — Progress Notes (Signed)
Subjective:   Patient ID: Bethany Harrison, female   DOB: 35 y.o.   MRN: [id number]   HPI Patient presents stating she has had a lot of discomfort in the right outside of her foot and also her big toenail left has become very loose damaged and hard for her to wear shoe gear with   ROS      Objective:  Physical Exam  Vascular status intact with irritation pain of the right peroneal tendon with inflammation fluid buildup and also noted to have a damaged left hallux nail partially loosened from the bed secondary to trauma     Assessment:  2 separate problems with 1 being peroneal tendinitis right the other being damaged hallux nail left with ingrown component     Plan:  H&P reviewed both conditions and for the right did sterile prep injected the tendon 3 mg Kenalog 5 mg Xylocaine at base of fifth metatarsal and for the left I have recommended removal of the nail allowing new nail to regrow and I anesthetized 60 mg like Marcaine mixture I did sterile prep remove the nail flush the bed and applied sterile dressing.  Reappoint to recheck may grow back normally may require permanent procedure at 1 point in future

## 2020-11-17 ENCOUNTER — Other Ambulatory Visit: Payer: Self-pay | Admitting: Family Medicine

## 2020-11-17 NOTE — Telephone Encounter (Signed)
Requested Prescriptions  Pending Prescriptions Disp Refills  . sertraline (ZOLOFT) 100 MG tablet [Pharmacy Med Name: SERTRALINE HCL 100 MG TABLET] 90 tablet 0    Sig: TAKE 1/2 TAB FOR 1 WEEK, THEN INCREASE TO 1 TAB DAILY     Psychiatry:  Antidepressants - SSRI Passed - 11/17/2020 12:30 PM      Passed - Completed PHQ-2 or PHQ-9 in the last 360 days      Passed - Valid encounter within last 6 months    Recent Outpatient Visits          2 months ago [place], Megan P, DO   4 months ago TMJ (temporomandibular joint syndrome)   [place], Megan P, DO   5 months ago Migraine without aura and without status migrainosus, not intractable   [place], Megan P, DO   4 years ago Establishing care with new doctor, encounter for   [place], MD      Future Appointments            In 3 days Amalia Hailey Nyoka Lint, MD [facility]

## 2020-11-20 ENCOUNTER — Encounter: Payer: Self-pay | Admitting: Obstetrics and Gynecology

## 2020-11-20 ENCOUNTER — Other Ambulatory Visit (HOSPITAL_COMMUNITY)
Admission: RE | Admit: 2020-11-20 | Discharge: 2020-11-20 | Disposition: A | Discharge status: Home / Self Care | Payer: 59 | Source: Ambulatory Visit | Attending: Obstetrics and Gynecology | Admitting: Obstetrics and Gynecology

## 2020-11-20 ENCOUNTER — Other Ambulatory Visit: Payer: Self-pay

## 2020-11-20 ENCOUNTER — Ambulatory Visit (INDEPENDENT_AMBULATORY_CARE_PROVIDER_SITE_OTHER): Payer: 59 | Admitting: Obstetrics and Gynecology

## 2020-11-20 VITALS — BP 98/71 | HR 76 | Resp 16 | Ht 67.0 in | Wt 151.2 lb

## 2020-11-20 DIAGNOSIS — Z01419 Encounter for gynecological examination (general) (routine) without abnormal findings: Secondary | ICD-10-CM | POA: Diagnosis not present

## 2020-11-20 DIAGNOSIS — Z975 Presence of (intrauterine) contraceptive device: Secondary | ICD-10-CM

## 2020-11-20 DIAGNOSIS — N809 Endometriosis, unspecified: Secondary | ICD-10-CM

## 2020-11-20 DIAGNOSIS — Z124 Encounter for screening for malignant neoplasm of cervix: Secondary | ICD-10-CM | POA: Diagnosis not present

## 2020-11-20 DIAGNOSIS — N921 Excessive and frequent menstruation with irregular cycle: Secondary | ICD-10-CM | POA: Diagnosis not present

## 2020-11-20 DIAGNOSIS — Z23 Encounter for immunization: Secondary | ICD-10-CM

## 2020-11-20 DIAGNOSIS — R102 Pelvic and perineal pain: Secondary | ICD-10-CM | POA: Diagnosis not present

## 2020-11-20 NOTE — Progress Notes (Signed)
HPI:      [name] is a 35 y.o. G0P0 who LMP was No LMP recorded (lmp unknown). (Menstrual status: IUD).  Subjective:   She presents today for her annual examination.  She continues to have monthly bleeding off and more than 10 days worth each month.  She also complains of intermittent pelvic pain and pain with intercourse. Patient has a history of endometriosis and has had multiple laparoscopic procedures. 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  She has decided that if she has children she will adopt. She is strongly considering hysterectomy because of her pelvic pain and irregular bleeding with IUD.  She states that this is her second IUD and neither 1 has given her cycle control.  She previously tried Depo and continued to have bleeding.  She has tried OCPs without success.    Hx: The following portions of the patient's history were reviewed and updated as appropriate:             She  has a past medical history of Abdominal pain, Anxiety, Chronic kidney disease, Depression, Endometriosis, Endometriosis, GERD (gastroesophageal reflux disease), Headache, Hemorrhoid, Migraine, and Ulcerative colitis ([place]). She does not have any pertinent problems on file. She  has a past surgical history that includes Bunionectomy (08, 10); Tonsillectomy; Dilation and curettage of uterus (2013); Appendectomy (2013); Pelvic laparoscopy (2009); Colonoscopy (2014); Intrauterine device insertion (06/24/2011); Tonsillectomy; Diagnostic laparoscopy; Hernia repair (89); Chromopertubation (N/A, 06/30/2014); laparoscopy (N/A, 06/30/2014); IUD removal (N/A, 06/30/2014); and Breast cyst excision (Right, 2017). Her family history includes Brain cancer in her maternal grandfather; Breast cancer (age of onset: 60) in her mother; Cancer in her mother and paternal grandmother; Diabetes in her  maternal grandmother, mother, and paternal grandmother. She  reports that she has never smoked. She has never used smokeless tobacco. She reports current alcohol use. She reports that she does not use drugs. She has a current medication list which includes the following prescription(s): alprazolam, amoxicillin-clavulanate, benzonatate, butalbital-acetaminophen-caffeine, chlorpheniramine-hydrocodone, cyclobenzaprine, ibuprofen, loratadine, nortriptyline, sertraline, topiramate, and triamcinolone acetonide. She is allergic to sulfa antibiotics.       Review of Systems:  Review of Systems  Constitutional: Denied constitutional symptoms, night sweats, recent illness, fatigue, fever, insomnia and weight loss.  Eyes: Denied eye symptoms, eye pain, photophobia, vision change and visual disturbance.  Ears/Nose/Throat/Neck: Denied ear, nose, throat or neck symptoms, hearing loss, nasal discharge, sinus congestion and sore throat.  Cardiovascular: Denied cardiovascular symptoms, arrhythmia, chest pain/pressure, edema, exercise intolerance, orthopnea and palpitations.  Respiratory: Denied pulmonary symptoms, asthma, pleuritic pain, productive sputum, cough, dyspnea and wheezing.  Gastrointestinal: Denied, gastro-esophageal reflux, melena, nausea and vomiting.  Genitourinary: See HPI for additional information.  Musculoskeletal: Denied musculoskeletal symptoms, stiffness, swelling, muscle weakness and myalgia.  Dermatologic: Denied dermatology symptoms, rash and scar.  Neurologic: Denied neurology symptoms, dizziness, headache, neck pain and syncope.  Psychiatric: Denied psychiatric symptoms, anxiety and depression.  Endocrine: Denied endocrine symptoms including hot flashes and night sweats.   Meds:   Current Outpatient Medications on File Prior to Visit  Medication Sig Dispense Refill   ALPRAZolam (XANAX) 0.25 MG tablet Take by mouth.     amoxicillin-clavulanate (AUGMENTIN) 875-125 MG tablet Take 1  tablet by mouth 2 (two) times daily. 20 tablet 0   benzonatate (TESSALON) 200 MG capsule Take 1 capsule (200 mg total) by mouth  2 (two) times daily as needed for cough. 20 capsule 0   butalbital-acetaminophen-caffeine (FIORICET) 50-325-40 MG tablet Take by mouth.     chlorpheniramine-HYDROcodone (TUSSIONEX PENNKINETIC ER) 10-8 MG/5ML SUER Take 5 mLs by mouth every 12 (twelve) hours as needed. 50 mL 0   cyclobenzaprine (FLEXERIL) 5 MG tablet Take 1 tablet before bed time for 5 days, then increase to 2 tablets before bed time.     ibuprofen (ADVIL) 200 MG tablet Take by mouth.     loratadine (CLARITIN) 10 MG tablet Take by mouth.     nortriptyline (PAMELOR) 25 MG capsule Take 1 capsule (25 mg total) by mouth at bedtime. 30 capsule 2   sertraline (ZOLOFT) 100 MG tablet TAKE 1/2 TAB FOR 1 WEEK, THEN INCREASE TO 1 TAB DAILY 90 tablet 0   topiramate (TOPAMAX) 100 MG tablet Take by mouth.     Triamcinolone Acetonide (NASACORT ALLERGY 24HR NA) Place into the nose.     No current facility-administered medications on file prior to visit.     Objective:     Vitals:   11/20/20 0812  BP: 98/71  Pulse: 76  Resp: 16    Filed Weights   11/20/20 0812  Weight: 151 lb 3.2 oz (68.6 kg)              Physical examination General NAD, Conversant  HEENT Atraumatic; Op clear with mmm.  Normo-cephalic. Pupils reactive. Anicteric sclerae  Thyroid/Neck Smooth without nodularity or enlargement. Normal ROM.  Neck Supple.  Skin No rashes, lesions or ulceration. Normal palpated skin turgor. No nodularity.  Breasts: No masses or discharge.  Symmetric.  No axillary adenopathy.  Lungs: Clear to auscultation.No rales or wheezes. Normal Respiratory effort, no retractions.  Heart: NSR.  No murmurs or rubs appreciated. No periferal edema  Abdomen: Soft.  Non-tender.  No masses.  No HSM. No hernia  Extremities: Moves all appropriately.  Normal ROM for age. No lymphadenopathy.  Neuro: Oriented to PPT.  Normal mood.  Normal affect.     Pelvic:   Vulva: Normal appearance.  No lesions.  Vagina: No lesions or abnormalities noted.  Support: Normal pelvic support.  Urethra No masses tenderness or scarring.  Meatus Normal size without lesions or prolapse.  Cervix: Normal appearance.  No lesions.  IUD strings noted at cervical os  Anus: Normal exam.  No lesions.  Perineum: Normal exam.  No lesions.        Bimanual   Uterus: Normal size.  Non-tender.  Mobile.  AV.  Adnexae: No masses.  Non-tender to palpation.  Cul-de-sac: Negative for abnormality.     Assessment:    G0P0 Patient Active Problem List   Diagnosis Date Noted   Depression, recurrent ([place]) 03/06/2018   Ulcerative colitis ([place]) 10/05/2017   Migraine without aura and without status migrainosus, not intractable 05/17/2016   Seasonal allergic rhinitis 05/17/2016   Interstitial cystitis 09/04/2014   Endometriosis 06/30/2014     1. Well woman exam with routine gynecological exam   2. Cervical cancer screening   3. Need for immunization against influenza   4. Pelvic pain in female   5. Breakthrough bleeding associated with intrauterine device (IUD)   6. Endometriosis        Plan:            1.  Basic Screening Recommendations The basic screening recommendations for asymptomatic women were discussed with the patient during her visit.  The age-appropriate recommendations were discussed with her and the rational for the  tests reviewed.  When I am informed by the patient that another primary care physician has previously obtained the age-appropriate tests and they are up-to-date, only outstanding tests are ordered and referrals given as necessary.  Abnormal results of tests will be discussed with her when all of her results are completed.  Routine preventative health maintenance measures emphasized: Exercise/Diet/Weight control, Tobacco Warnings, Alcohol/Substance use risks and Stress Management Pap performed 2.  We had a long discussion  regarding her breakthrough bleeding and pain issues likely associated with endometriosis/adenomyosis.  She is strongly considering hysterectomy.  All questions were answered.  Orders Orders Placed This Encounter  Procedures   Flu Vaccine QUAD 50moIM (Fluarix, Fluzone & Alfiuria Quad PF)    No orders of the defined types were placed in this encounter.           F/U  No follow-ups on file. I spent 14 additional minutes involved in the care of this patient discussing endometriosis and options for future management.  In addition I spent time preparing to see the patient by obtaining and reviewing her medical history (including labs, imaging tests and prior procedures), documenting clinical information in the electronic health record (EHR), counseling and coordinating care plans, writing and sending prescriptions, ordering tests or procedures and in direct communicating with the patient and medical staff discussing pertinent items from her history and physical exam.  DFinis Bud M.D. 11/20/2020 8:47 AM

## 2020-11-23 LAB — CYTOLOGY - PAP
Comment: NEGATIVE
Diagnosis: NEGATIVE
High risk HPV: NEGATIVE

## 2020-12-01 ENCOUNTER — Encounter: Payer: BC Managed Care – PPO | Admitting: Obstetrics and Gynecology

## 2021-02-12 ENCOUNTER — Other Ambulatory Visit: Payer: Self-pay | Admitting: Family Medicine

## 2021-02-12 NOTE — Telephone Encounter (Signed)
Requested Prescriptions  Pending Prescriptions Disp Refills   sertraline (ZOLOFT) 100 MG tablet [Pharmacy Med Name: SERTRALINE HCL 100 MG TABLET] 90 tablet 0    Sig: TAKE 1/2 TAB FOR 1 WEEK, THEN INCREASE TO 1 TAB DAILY     Psychiatry:  Antidepressants - SSRI Passed - 02/12/2021  1:34 AM      Passed - Completed PHQ-2 or PHQ-9 in the last 360 days      Passed - Valid encounter within last 6 months    Recent Outpatient Visits          5 months ago [place], Megan P, DO   7 months ago TMJ (temporomandibular joint syndrome)   [place], Megan P, DO   8 months ago Migraine without aura and without status migrainosus, not intractable   [place], Megan P, DO   4 years ago Establishing care with new doctor, encounter for   [place], MD      Future Appointments            In 5 days Valerie Roys, DO [facility], [place]   In 9 months Amalia Hailey, Nyoka Lint, MD [facility]

## 2021-02-17 ENCOUNTER — Other Ambulatory Visit: Payer: Self-pay | Admitting: Family Medicine

## 2021-02-17 ENCOUNTER — Other Ambulatory Visit: Payer: Self-pay

## 2021-02-17 ENCOUNTER — Encounter: Payer: Self-pay | Admitting: Family Medicine

## 2021-02-17 ENCOUNTER — Ambulatory Visit (INDEPENDENT_AMBULATORY_CARE_PROVIDER_SITE_OTHER): Payer: 59 | Admitting: Family Medicine

## 2021-02-17 VITALS — BP 112/79 | HR 66 | Temp 98.0°F | Ht 67.0 in | Wt 149.6 lb

## 2021-02-17 DIAGNOSIS — K5180 Other ulcerative colitis without complications: Secondary | ICD-10-CM | POA: Diagnosis not present

## 2021-02-17 DIAGNOSIS — Z0000 Encounter for general adult medical examination without abnormal findings: Secondary | ICD-10-CM

## 2021-02-17 DIAGNOSIS — F339 Major depressive disorder, recurrent, unspecified: Secondary | ICD-10-CM | POA: Diagnosis not present

## 2021-02-17 DIAGNOSIS — G43009 Migraine without aura, not intractable, without status migrainosus: Secondary | ICD-10-CM | POA: Diagnosis not present

## 2021-02-17 LAB — URINALYSIS, ROUTINE W REFLEX MICROSCOPIC
Bilirubin, UA: NEGATIVE
Glucose, UA: NEGATIVE
Ketones, UA: NEGATIVE
Leukocytes,UA: NEGATIVE
Nitrite, UA: NEGATIVE
Protein,UA: NEGATIVE
Specific Gravity, UA: 1.02 (ref 1.005–1.030)
Urobilinogen, Ur: 0.2 mg/dL (ref 0.2–1.0)
pH, UA: 6 (ref 5.0–7.5)

## 2021-02-17 LAB — MICROSCOPIC EXAMINATION: WBC, UA: NONE SEEN /hpf (ref 0–5)

## 2021-02-17 MED ORDER — TRAZODONE HCL 50 MG PO TABS: 25.0000 mg | ORAL_TABLET | Freq: Every evening | ORAL | 3 refills | Status: DC | PRN

## 2021-02-17 MED ORDER — VENLAFAXINE HCL ER 75 MG PO CP24: 75.0000 mg | ORAL_CAPSULE | Freq: Every day | ORAL | 2 refills | Status: DC

## 2021-02-17 NOTE — Assessment & Plan Note (Signed)
Not doing well. Will change sertraline to effexor with goal of also helping migraines. Call with any concerns. Continue to monitor. Recheck 1 month. List of counselors for CBT given today.

## 2021-02-17 NOTE — Assessment & Plan Note (Signed)
Stable. Continue to follow with GI. Call with any concerns.  

## 2021-02-17 NOTE — Addendum Note (Signed)
Addended by: Erie Noe on: 02/17/2021 12:13 PM   Modules accepted: Orders

## 2021-02-17 NOTE — Assessment & Plan Note (Signed)
Not under great control. Continue to follow with neurology. Will start effexor. Call with any concerns.

## 2021-02-17 NOTE — Progress Notes (Signed)
BP 112/79    Pulse 66    Temp 98 F (36.7 C)    Ht 5\' 7"  (1.702 m)    Wt 149 lb 9.6 oz (67.9 kg)    SpO2 98%    BMI 23.43 kg/m    Subjective:    Patient ID: Bethany Harrison, female    DOB: 11/01/1985, 36 y.o.   MRN: [id number]  HPI: Bethany Harrison is a 36 y.o. female presenting on 02/17/2021 for comprehensive medical examination. Current medical complaints include:  ANXIETY/DURATION Duration: chronic Status:exacerbated Anxious mood: yes  Excessive worrying: yes Irritability: yes  Sweating: no Nausea: no Palpitations:yes Hyperventilation: no Panic attacks: yes Agoraphobia: no  Obscessions/compulsions: yes Depressed mood: yes Depression screen [facility] 2/9 02/17/2021 11/20/2020 06/25/2020 05/27/2020 10/27/2016  Decreased Interest 3 1 2 1  0  Down, Depressed, Hopeless 2 1 2 1  0  PHQ - 2 Score 5 2 4 2  0  Altered sleeping 3 1 2 1  0  Tired, decreased energy 2 1 2 1  0  Change in appetite 1 0 0 1 0  Feeling bad or failure about yourself  1 0 0 0 0  Trouble concentrating 1 0 0 0 0  Moving slowly or fidgety/restless 0 0 0 2 0  Suicidal thoughts 0 0 0 0 0  PHQ-9 Score 13 4 8 7  0  Difficult doing work/chores - Somewhat difficult Not difficult at all - -   GAD 7 : Generalized Anxiety Score 02/17/2021 11/20/2020 05/27/2020  Nervous, Anxious, on Edge 2 1 2   Control/stop worrying 2 2 2   Worry too much - different things 2 1 2   Trouble relaxing 2 1 2   Restless 1 0 2  Easily annoyed or irritable 3 1 2   Afraid - awful might happen 0 0 2  Total GAD 7 Score 12 6 14   Anxiety Difficulty Very difficult Somewhat difficult -   Anhedonia: no Weight changes: no Insomnia: yes hard to fall asleep  Hypersomnia: no Fatigue/loss of energy: yes Feelings of worthlessness: no Feelings of guilt: yes Impaired concentration/indecisiveness: no Suicidal ideations: no  Crying spells: no Recent Stressors/Life Changes: no   Relationship problems: no   Family stress: yes     Financial stress: yes    Job stress: yes     Recent death/loss: no  She currently lives with: husband Menopausal Symptoms: no  Depression Screen done today and results listed below:  Depression screen [facility] 2/9 02/17/2021 11/20/2020 06/25/2020 05/27/2020 10/27/2016  Decreased Interest 3 1 2 1  0  Down, Depressed, Hopeless 2 1 2 1  0  PHQ - 2 Score 5 2 4 2  0  Altered sleeping 3 1 2 1  0  Tired, decreased energy 2 1 2 1  0  Change in appetite 1 0 0 1 0  Feeling bad or failure about yourself  1 0 0 0 0  Trouble concentrating 1 0 0 0 0  Moving slowly or fidgety/restless 0 0 0 2 0  Suicidal thoughts 0 0 0 0 0  PHQ-9 Score 13 4 8 7  0  Difficult doing work/chores - Somewhat difficult Not difficult at all - -    Past Medical History:  Past Medical History:  Diagnosis Date   Abdominal pain    Anxiety    Chronic kidney disease    Depression    Endometriosis    Endometriosis    GERD (gastroesophageal reflux disease)    Headache    Hemorrhoid    Migraine    Ulcerative colitis (  [facility])     Surgical History:  Past Surgical History:  Procedure Laterality Date   APPENDECTOMY  2013   with laparoscopy @ [facility] 2017   sebaceous cyst removed   BUNIONECTOMY  08, 10   CHROMOPERTUBATION N/A 06/30/2014   Procedure: CHROMOPERTUBATION;  Surgeon: [name], MD;  Location: [facility] ORS;  Service: Gynecology;  Laterality: N/A;   COLONOSCOPY  2014   DIAGNOSTIC LAPAROSCOPY     DILATION AND CURETTAGE OF UTERUS  2013   polyp   HERNIA REPAIR  87   umbilical   INTRAUTERINE DEVICE INSERTION  06/24/2011   Mirena IUD   IUD REMOVAL N/A 06/30/2014   Procedure: INTRAUTERINE DEVICE (IUD) REMOVAL;  Surgeon: [name], MD;  Location: [facility] ORS;  Service: Gynecology;  Laterality: N/A;   LAPAROSCOPY N/A 06/30/2014   Procedure:  with excision of endoemtriosis and biopsy;  Surgeon: [name], MD;  Location: [facility] ORS;  Service: Gynecology;  Laterality: N/A;   PELVIC LAPAROSCOPY  2009   times 2    TONSILLECTOMY     TONSILLECTOMY      Medications:  Current Outpatient Medications on File Prior to Visit  Medication Sig   ALPRAZolam (XANAX) 0.25 MG tablet Take by mouth.   butalbital-acetaminophen-caffeine (FIORICET) 50-325-40 MG tablet Take by mouth.   cyclobenzaprine (FLEXERIL) 5 MG tablet Take 1 tablet before bed time for 5 days, then increase to 2 tablets before bed time.   ibuprofen (ADVIL) 200 MG tablet Take by mouth.   loratadine (CLARITIN) 10 MG tablet Take by mouth.   NURTEC 75 MG TBDP Take 1 tablet by mouth every other day.   ondansetron (ZOFRAN-ODT) 4 MG disintegrating tablet Take 4 mg by mouth every 8 (eight) hours as needed.   topiramate (TOPAMAX) 100 MG tablet Take by mouth.   Triamcinolone Acetonide (NASACORT ALLERGY 24HR NA) Place into the nose.   No current facility-administered medications on file prior to visit.    Allergies:  Allergies  Allergen Reactions   Sulfa Antibiotics Nausea Only    Social History:  Social History   Socioeconomic History   Marital status: Married    Spouse name: Not on file   Number of children: Not on file   Years of education: Not on file   Highest education level: Not on file  Occupational History   Occupation: HR    Employer: [employer]  Tobacco Use   Smoking status: Never   Smokeless tobacco: Never  Vaping Use   Vaping Use: Never used  Substance and Sexual Activity   Alcohol use: Yes    Alcohol/week: 0.0 standard drinks    Comment: occas   Drug use: No   Sexual activity: Yes    Partners: Male    Birth control/protection: I.U.D.  Other Topics Concern   Not on file  Social History Narrative   Not on file   Social Determinants of Health   Financial Resource Strain: Not on file  Food Insecurity: Not on file  Transportation Needs: Not on file  Physical Activity: Not on file  Stress: Not on file  Social Connections: Not on file  Intimate Partner Violence: Not on file   Social History   Tobacco Use   Smoking Status Never  Smokeless Tobacco Never   Social History   Substance and Sexual Activity  Alcohol Use Yes   Alcohol/week: 0.0 standard drinks   Comment: occas    Family History:  Family History  Problem Relation Age of Onset   Breast cancer Mother 22   Cancer Mother    Diabetes Mother    Cancer Paternal Grandmother        colon   Diabetes Paternal Grandmother    Diabetes Maternal Grandmother    Brain cancer Maternal Grandfather    Heart disease Neg Hx    Ovarian cancer Neg Hx     Past medical history, surgical history, medications, allergies, family history and social history reviewed with patient today and changes made to appropriate areas of the chart.   Review of Systems  Constitutional: Negative.   HENT: Negative.    Eyes: Negative.   Respiratory: Negative.    Cardiovascular: Negative.   Gastrointestinal:  Positive for constipation. Negative for abdominal pain, blood in stool, diarrhea, heartburn, melena, nausea and vomiting.  Genitourinary: Negative.   Musculoskeletal: Negative.   Skin: Negative.   Neurological:  Positive for dizziness. Negative for tingling, tremors, sensory change, speech change, focal weakness, seizures, loss of consciousness, weakness and headaches.       Syncope x1 over the summer while having BM  Endo/Heme/Allergies: Negative.   Psychiatric/Behavioral:  Positive for depression. Negative for hallucinations, memory loss, substance abuse and suicidal ideas. The patient is nervous/anxious and has insomnia.   All other ROS negative except what is listed above and in the HPI.      Objective:    BP 112/79    Pulse 66    Temp 98 F (36.7 C)    Ht 5\' 7"  (1.702 m)    Wt 149 lb 9.6 oz (67.9 kg)    SpO2 98%    BMI 23.43 kg/m   Wt Readings from Last 3 Encounters:  02/17/21 149 lb 9.6 oz (67.9 kg)  11/20/20 151 lb 3.2 oz (68.6 kg)  11/04/20 148 lb (67.1 kg)    Physical Exam Vitals and nursing note reviewed.  Constitutional:      General:  She is not in acute distress.    Appearance: Normal appearance. She is not ill-appearing, toxic-appearing or diaphoretic.  HENT:     Head: Normocephalic and atraumatic.     Right Ear: Tympanic membrane, ear canal and external ear normal. There is no impacted cerumen.     Left Ear: Tympanic membrane, ear canal and external ear normal. There is no impacted cerumen.     Nose: Nose normal. No congestion or rhinorrhea.     Mouth/Throat:     Mouth: Mucous membranes are moist.     Pharynx: Oropharynx is clear. No oropharyngeal exudate or posterior oropharyngeal erythema.  Eyes:     General: No scleral icterus.       Right eye: No discharge.        Left eye: No discharge.     Extraocular Movements: Extraocular movements intact.     Conjunctiva/sclera: Conjunctivae normal.     Pupils: Pupils are equal, round, and reactive to light.  Neck:     Vascular: No carotid bruit.  Cardiovascular:     Rate and Rhythm: Normal rate and regular rhythm.     Pulses: Normal pulses.     Heart sounds: No murmur heard.   No friction rub. No gallop.  Pulmonary:     Effort: Pulmonary effort is normal. No respiratory distress.     Breath sounds: Normal breath sounds. No stridor. No wheezing, rhonchi or rales.  Chest:     Chest wall: No tenderness.  Abdominal:     General: Abdomen is flat. Bowel sounds  are normal. There is no distension.     Palpations: Abdomen is soft. There is no mass.     Tenderness: There is no abdominal tenderness. There is no right CVA tenderness, left CVA tenderness, guarding or rebound.     Hernia: No hernia is present.  Genitourinary:    Comments: Breast and pelvic exams deferred with shared decision making Musculoskeletal:        General: No swelling, tenderness, deformity or signs of injury. Normal range of motion.     Cervical back: Normal range of motion and neck supple. No rigidity. No muscular tenderness.     Right lower leg: No edema.     Left lower leg: No edema.   Lymphadenopathy:     Cervical: No cervical adenopathy.  Skin:    General: Skin is warm and dry.     Capillary Refill: Capillary refill takes less than 2 seconds.     Coloration: Skin is not jaundiced or pale.     Findings: No bruising, erythema, lesion or rash.  Neurological:     General: No focal deficit present.     Mental Status: She is alert and oriented to person, place, and time. Mental status is at baseline.     Cranial Nerves: No cranial nerve deficit.     Sensory: No sensory deficit.     Motor: No weakness.     Coordination: Coordination normal.     Gait: Gait normal.     Deep Tendon Reflexes: Reflexes normal.  Psychiatric:        Mood and Affect: Mood normal.        Behavior: Behavior normal.        Thought Content: Thought content normal.        Judgment: Judgment normal.    Results for orders placed or performed in visit on 11/20/20  Cytology - PAP  Result Value Ref Range   High risk HPV Negative    Adequacy      Satisfactory for evaluation; transformation zone component PRESENT.   Diagnosis      - Negative for intraepithelial lesion or malignancy (NILM)   Comment Normal Reference Range HPV - Negative       Assessment & Plan:   Problem List Items Addressed This Visit       Cardiovascular and Mediastinum   Migraine without aura and without status migrainosus, not intractable    Not under great control. Continue to follow with neurology. Will start effexor. Call with any concerns.       Relevant Medications   NURTEC 75 MG TBDP   traZODone (DESYREL) 50 MG tablet   venlafaxine XR (EFFEXOR XR) 75 MG 24 hr capsule     Digestive   Ulcerative colitis ([place])    Stable. Continue to follow with GI. Call with any concerns.         Other   Depression, recurrent ([place])    Not doing well. Will change sertraline to effexor with goal of also helping migraines. Call with any concerns. Continue to monitor. Recheck 1 month. List of counselors for CBT given today.        Relevant Medications   traZODone (DESYREL) 50 MG tablet   venlafaxine XR (EFFEXOR XR) 75 MG 24 hr capsule   Other Visit Diagnoses     Routine general medical examination at a health care facility    -  Primary   Vaccines up to date. Screening labs checked today. Pap up to date. Continue diet and  exercise. Call with any concerns.    Relevant Orders   CBC with Differential/Platelet   Comprehensive metabolic panel   Lipid Panel w/o Chol/HDL Ratio   Urinalysis, Routine w reflex microscopic   TSH   Hepatitis C Antibody   HIV Antibody (routine testing w rflx)        Follow up plan: Return in about 4 weeks (around 03/17/2021) for virtual OK.   LABORATORY TESTING:  - Pap smear: up to date  IMMUNIZATIONS:   - Tdap: Tetanus vaccination status reviewed: last tetanus booster within 10 years. - Influenza: Up to date - Pneumovax: Not applicable - Prevnar: Not applicable - COVID: Up to date   PATIENT COUNSELING:   Advised to take 1 mg of folate supplement per day if capable of pregnancy.   Sexuality: Discussed sexually transmitted diseases, partner selection, use of condoms, avoidance of unintended pregnancy  and contraceptive alternatives.   Advised to avoid cigarette smoking.  I discussed with the patient that most people either abstain from alcohol or drink within safe limits (<=14/week and <=4 drinks/occasion for males, <=7/weeks and <= 3 drinks/occasion for females) and that the risk for alcohol disorders and other health effects rises proportionally with the number of drinks per week and how often a drinker exceeds daily limits.  Discussed cessation/primary prevention of drug use and availability of treatment for abuse.   Diet: Encouraged to adjust caloric intake to maintain  or achieve ideal body weight, to reduce intake of dietary saturated fat and total fat, to limit sodium intake by avoiding high sodium foods and not adding table salt, and to maintain adequate dietary  potassium and calcium preferably from fresh fruits, vegetables, and low-fat dairy products.    stressed the importance of regular exercise  Injury prevention: Discussed safety belts, safety helmets, smoke detector, smoking near bedding or upholstery.   Dental health: Discussed importance of regular tooth brushing, flossing, and dental visits.    NEXT PREVENTATIVE PHYSICAL DUE IN 1 YEAR. Return in about 4 weeks (around 03/17/2021) for virtual OK.

## 2021-02-18 LAB — COMPREHENSIVE METABOLIC PANEL
ALT: 10 IU/L (ref 0–32)
AST: 15 IU/L (ref 0–40)
Albumin/Globulin Ratio: 2.2 (ref 1.2–2.2)
Albumin: 4.7 g/dL (ref 3.8–4.8)
Alkaline Phosphatase: 53 IU/L (ref 44–121)
BUN/Creatinine Ratio: 21 (ref 9–23)
BUN: 16 mg/dL (ref 6–20)
Bilirubin Total: 0.4 mg/dL (ref 0.0–1.2)
CO2: 20 mmol/L (ref 20–29)
Calcium: 9.2 mg/dL (ref 8.7–10.2)
Chloride: 104 mmol/L (ref 96–106)
Creatinine, Ser: 0.77 mg/dL (ref 0.57–1.00)
Globulin, Total: 2.1 g/dL (ref 1.5–4.5)
Glucose: 82 mg/dL (ref 70–99)
Potassium: 4.6 mmol/L (ref 3.5–5.2)
Sodium: 137 mmol/L (ref 134–144)
Total Protein: 6.8 g/dL (ref 6.0–8.5)
eGFR: 102 mL/min/{1.73_m2} (ref >59)

## 2021-02-18 LAB — CBC WITH DIFFERENTIAL/PLATELET
Basophils Absolute: 0.1 10*3/uL (ref 0.0–0.2)
Basos: 1 %
EOS (ABSOLUTE): 0.1 10*3/uL (ref 0.0–0.4)
Eos: 1 %
Hematocrit: 41.4 % (ref 34.0–46.6)
Hemoglobin: 13.6 g/dL (ref 11.1–15.9)
Immature Grans (Abs): 0 10*3/uL (ref 0.0–0.1)
Immature Granulocytes: 0 %
Lymphocytes Absolute: 2.4 10*3/uL (ref 0.7–3.1)
Lymphs: 45 %
MCH: 32.5 pg (ref 26.6–33.0)
MCHC: 32.9 g/dL (ref 31.5–35.7)
MCV: 99 fL — ABNORMAL HIGH (ref 79–97)
Monocytes Absolute: 0.3 10*3/uL (ref 0.1–0.9)
Monocytes: 5 %
Neutrophils Absolute: 2.5 10*3/uL (ref 1.4–7.0)
Neutrophils: 48 %
Platelets: 299 10*3/uL (ref 150–450)
RBC: 4.18 x10E6/uL (ref 3.77–5.28)
RDW: 11.6 % — ABNORMAL LOW (ref 11.7–15.4)
WBC: 5.3 10*3/uL (ref 3.4–10.8)

## 2021-02-18 LAB — LIPID PANEL W/O CHOL/HDL RATIO
Cholesterol, Total: 193 mg/dL (ref 100–199)
HDL: 47 mg/dL (ref >39)
LDL Chol Calc (NIH): 133 mg/dL — ABNORMAL HIGH (ref 0–99)
Triglycerides: 71 mg/dL (ref 0–149)
VLDL Cholesterol Cal: 13 mg/dL (ref 5–40)

## 2021-02-18 LAB — TSH: TSH: 2.54 u[IU]/mL (ref 0.450–4.500)

## 2021-02-18 LAB — HEPATITIS C ANTIBODY: Hep C Virus Ab: <0.1 s/co ratio (ref 0.0–0.9)

## 2021-02-18 LAB — HIV ANTIBODY (ROUTINE TESTING W REFLEX): HIV Screen 4th Generation wRfx: NONREACTIVE

## 2021-03-04 ENCOUNTER — Other Ambulatory Visit: Payer: Self-pay

## 2021-03-04 ENCOUNTER — Encounter: Payer: Self-pay | Admitting: Family Medicine

## 2021-03-04 ENCOUNTER — Ambulatory Visit
Admission: EM | Admit: 2021-03-04 | Discharge: 2021-03-04 | Disposition: A | Discharge status: Home / Self Care | Payer: 59 | Attending: Family | Admitting: Family

## 2021-03-04 DIAGNOSIS — R350 Frequency of micturition: Secondary | ICD-10-CM | POA: Diagnosis present

## 2021-03-04 DIAGNOSIS — R300 Dysuria: Secondary | ICD-10-CM | POA: Diagnosis present

## 2021-03-04 DIAGNOSIS — J029 Acute pharyngitis, unspecified: Secondary | ICD-10-CM | POA: Insufficient documentation

## 2021-03-04 DIAGNOSIS — H9201 Otalgia, right ear: Secondary | ICD-10-CM | POA: Diagnosis present

## 2021-03-04 LAB — URINALYSIS, ROUTINE W REFLEX MICROSCOPIC
Bilirubin Urine: NEGATIVE
Glucose, UA: NEGATIVE mg/dL
Ketones, ur: NEGATIVE mg/dL
Leukocytes,Ua: NEGATIVE
Nitrite: NEGATIVE
Protein, ur: NEGATIVE mg/dL
Specific Gravity, Urine: 1.015 (ref 1.005–1.030)
pH: 6 (ref 5.0–8.0)

## 2021-03-04 LAB — URINALYSIS, MICROSCOPIC (REFLEX): Bacteria, UA: NONE SEEN

## 2021-03-04 LAB — GROUP A STREP BY PCR: Group A Strep by PCR: NOT DETECTED

## 2021-03-04 MED ORDER — LIDOCAINE VISCOUS HCL 2 % MT SOLN: OROMUCOSAL | 0 refills | Status: DC

## 2021-03-04 NOTE — ED Triage Notes (Signed)
Patient presents to Urgent Care with complaints of sore throat and ear pain since Monday. Dysuria, urgency x 3 weeks.   Denies fever or hematuria.

## 2021-03-04 NOTE — Discharge Instructions (Signed)
Recommend take OTC Advil 400-600mg  every 6 hours as needed for pain. May use Viscous lidocaine- 49ml (2 teaspoons) swish and spit out every 3 to 4 hours as needed for throat pain. Recommend follow-up with your GYN regarding urinary frequency and pain and here in 3 to 4 days if throat pain does not improve.

## 2021-03-04 NOTE — ED Provider Notes (Signed)
[facility]    CSN: [id number] Arrival date & time: 03/04/21  0853      History   Chief Complaint Chief Complaint  Patient presents with   Sore Throat   Urinary Tract Infection    HPI Bethany Harrison is a 36 y.o. female.   36 year old female presents with sore throat, mainly on right side for the past 4 days. Right ear is also hurting now. Minimal nasal congestion. Denies any fever, cough, nausea or vomiting. Had tonsillectomy and has not had strep in years but requests evaluation. No known exposure to strep. Has used OTC sore throat spray with minimal relief. Also having some pain with urination and urgency for the past 3 weeks. No distinct hematuria or unusual vaginal discharge. Often has these symptoms related to menstrual cramping. Currently on Mirena IUD and her LMP was about 10 days ago. Requesting evaluation for UTI before following up with her GYN. Other chronic health issues include migraine headaches, TMJ, endometriosis, GERD, ulcerative colitis and seasonal allergies. Currently on Topamax and Effexor daily, Trazodone, Flexeril, Fioricet, Advil, Nasacort and Zofran prn.   The history is provided by the patient.   Past Medical History:  Diagnosis Date   Abdominal pain    Anxiety    Chronic kidney disease    Depression    Endometriosis    Endometriosis    GERD (gastroesophageal reflux disease)    Headache    Hemorrhoid    Migraine    Ulcerative colitis [facility])     Patient Active Problem List   Diagnosis Date Noted   Depression, recurrent ([place]) 03/06/2018   Ulcerative colitis ([place]) 10/05/2017   Migraine without aura and without status migrainosus, not intractable 05/17/2016   Seasonal allergic rhinitis 05/17/2016   Interstitial cystitis 09/04/2014   Endometriosis 06/30/2014    Past Surgical History:  Procedure Laterality Date   APPENDECTOMY  2013   with laparoscopy @ [facility] 2017   sebaceous cyst removed    BUNIONECTOMY  08, 10   CHROMOPERTUBATION N/A 06/30/2014   Procedure: CHROMOPERTUBATION;  Surgeon: [name], MD;  Location: [facility] ORS;  Service: Gynecology;  Laterality: N/A;   COLONOSCOPY  2014   DIAGNOSTIC LAPAROSCOPY     DILATION AND CURETTAGE OF UTERUS  2013   polyp   HERNIA REPAIR  87   umbilical   INTRAUTERINE DEVICE INSERTION  06/24/2011   Mirena IUD   IUD REMOVAL N/A 06/30/2014   Procedure: INTRAUTERINE DEVICE (IUD) REMOVAL;  Surgeon: [name], MD;  Location: [facility] ORS;  Service: Gynecology;  Laterality: N/A;   LAPAROSCOPY N/A 06/30/2014   Procedure:  with excision of endoemtriosis and biopsy;  Surgeon: [name], MD;  Location: [facility] ORS;  Service: Gynecology;  Laterality: N/A;   PELVIC LAPAROSCOPY  2009   times 2   TONSILLECTOMY     TONSILLECTOMY      OB History     Gravida  0   Para      Term      Preterm      AB      Living         SAB      IAB      Ectopic      Multiple      Live Births               Home Medications    Prior to Admission medications   Medication  Sig Start Date End Date Taking? Authorizing Provider  lidocaine (XYLOCAINE) 2 % solution Swish 32ml in the mouth/throat and spit out every 3 to 4 hours as needed for pain. 03/04/21  Yes Tifanie Gardiner, Nicholes Stairs, NP  ALPRAZolam Duanne Moron) 0.25 MG tablet Take by mouth. 06/15/19   [provider]  butalbital-acetaminophen-caffeine (FIORICET) 50-325-40 MG tablet Take by mouth. 05/06/20   [provider]  cyclobenzaprine (FLEXERIL) 5 MG tablet Take 1 tablet before bed time for 5 days, then increase to 2 tablets before bed time. 10/28/20 10/28/21  [provider]  ibuprofen (ADVIL) 200 MG tablet Take by mouth.    [provider]  ondansetron (ZOFRAN-ODT) 4 MG disintegrating tablet Take 4 mg by mouth every 8 (eight) hours as needed. 12/17/20   [provider]  topiramate (TOPAMAX) 100 MG tablet Take by mouth. 10/14/20 10/14/21   [provider]  traZODone (DESYREL) 50 MG tablet Take 0.5-1 tablets (25-50 mg total) by mouth at bedtime as needed for sleep. 02/17/21   Johnson, Megan P, DO  Triamcinolone Acetonide (NASACORT ALLERGY 24HR NA) Place into the nose.    [provider]  venlafaxine XR (EFFEXOR XR) 75 MG 24 hr capsule Take 1 capsule (75 mg total) by mouth daily with breakfast. 02/17/21   Valerie Roys, DO    Family History Family History  Problem Relation Age of Onset   Breast cancer Mother 71   Cancer Mother    Diabetes Mother    Cancer Paternal Grandmother        colon   Diabetes Paternal Grandmother    Diabetes Maternal Grandmother    Brain cancer Maternal Grandfather    Heart disease Neg Hx    Ovarian cancer Neg Hx     Social History Social History   Tobacco Use   Smoking status: Never   Smokeless tobacco: Never  Vaping Use   Vaping Use: Never used  Substance Use Topics   Alcohol use: Yes    Alcohol/week: 0.0 standard drinks    Comment: occas   Drug use: No     Allergies   Sulfa antibiotics   Review of Systems Review of Systems  Constitutional:  Positive for fatigue. Negative for activity change, appetite change, chills, diaphoresis and fever.  HENT:  Positive for ear pain (right) and sore throat. Negative for congestion, ear discharge, facial swelling, mouth sores, nosebleeds, rhinorrhea, sinus pressure, sinus pain and trouble swallowing.   Eyes:  Negative for pain, discharge, redness and itching.  Respiratory:  Negative for cough, shortness of breath and wheezing.   Gastrointestinal:  Positive for abdominal pain (chronic). Negative for nausea and vomiting.  Genitourinary:  Positive for dysuria, frequency and menstrual problem. Negative for decreased urine volume, difficulty urinating, flank pain, genital sores, hematuria, vaginal bleeding and vaginal discharge.  Musculoskeletal:  Negative for arthralgias, back pain, myalgias, neck pain and neck stiffness.  Skin:   Negative for color change and rash.  Allergic/Immunologic: Positive for environmental allergies. Negative for food allergies and immunocompromised state.  Neurological:  Positive for headaches. Negative for dizziness, tremors, seizures, syncope, speech difficulty, weakness and numbness.  Hematological:  Negative for adenopathy. Does not bruise/bleed easily.  Psychiatric/Behavioral:  Positive for sleep disturbance.     Physical Exam Triage Vital Signs ED Triage Vitals [03/04/21 0908]  Enc Vitals Group     BP 126/85     Pulse Rate 100     Resp 16     Temp 99 F (37.2 C)  Temp Source Oral     SpO2 98 %     Weight      Height      Head Circumference      Peak Flow      Pain Score      Pain Loc      Pain Edu?      Excl. in [place]?    No data found.  Updated Vital Signs BP 126/85 (BP Location: Right Arm)    Pulse 100    Temp 99 F (37.2 C) (Oral)    Resp 16    SpO2 98%   Visual Acuity Right Eye Distance:   Left Eye Distance:   Bilateral Distance:    Right Eye Near:   Left Eye Near:    Bilateral Near:     Physical Exam Vitals and nursing note reviewed.  Constitutional:      General: She is awake. She is not in acute distress.    Appearance: She is well-developed and well-groomed.     Comments: She is sitting on the exam table in no acute distress.   HENT:     Head: Normocephalic and atraumatic.     Right Ear: Hearing, tympanic membrane, ear canal and external ear normal.     Left Ear: Hearing, tympanic membrane, ear canal and external ear normal.     Nose: Nose normal.     Right Sinus: No maxillary sinus tenderness or frontal sinus tenderness.     Left Sinus: No maxillary sinus tenderness or frontal sinus tenderness.     Mouth/Throat:     Lips: Pink.     Mouth: Mucous membranes are moist.     Pharynx: Uvula midline. Posterior oropharyngeal erythema present. No pharyngeal swelling or uvula swelling.     Comments: Slight clear post nasal drainage present. Tonsils  absent Eyes:     Extraocular Movements: Extraocular movements intact.     Conjunctiva/sclera: Conjunctivae normal.  Cardiovascular:     Rate and Rhythm: Normal rate and regular rhythm.     Heart sounds: Normal heart sounds. No murmur heard. Pulmonary:     Effort: Pulmonary effort is normal. No respiratory distress.     Breath sounds: Normal breath sounds and air entry. No decreased air movement. No decreased breath sounds, wheezing, rhonchi or rales.  Abdominal:     General: Abdomen is flat.     Palpations: Abdomen is soft.     Tenderness: There is generalized abdominal tenderness (mild). There is no right CVA tenderness, left CVA tenderness, guarding or rebound.  Musculoskeletal:        General: Normal range of motion.     Cervical back: Normal range of motion and neck supple.  Lymphadenopathy:     Cervical: Cervical adenopathy present.     Right cervical: Superficial cervical adenopathy present.     Left cervical: No superficial cervical adenopathy.  Skin:    General: Skin is warm and dry.     Findings: No rash.  Neurological:     General: No focal deficit present.     Mental Status: She is alert and oriented to person, place, and time.  Psychiatric:        Mood and Affect: Mood normal.        Behavior: Behavior normal. Behavior is cooperative.        Thought Content: Thought content normal.        Judgment: Judgment normal.     UC Treatments / Results  Labs (all labs  ordered are listed, but only abnormal results are displayed) Labs Reviewed  URINALYSIS, ROUTINE W REFLEX MICROSCOPIC - Abnormal; Notable for the following components:      Result Value   Hgb urine dipstick TRACE (*)    All other components within normal limits  GROUP A STREP BY PCR  URINALYSIS, MICROSCOPIC (REFLEX)    EKG   Radiology No results found.  Procedures Procedures (including critical care time)  Medications Ordered in UC Medications - No data to display  Initial Impression /  Assessment and Plan / UC Course  I have reviewed the triage vital signs and the nursing notes.  Pertinent labs & imaging results that were available during my care of the patient were reviewed by me and considered in my medical decision making (see chart for details).     Reviewed negative rapid strep test with patient. No ear infection. Discussed that she probably has a viral pharyngitis. Recommend take OTC Advil 400mg -600mg  every 6 to 8 hours as needed for pain. May also try Viscous lidocaine 2 teaspoons swish and spit out every 3 to 4 hours as needed for pain- patient uncertain if she will fill Rx- Rx printed. Follow-up here in 3 to 4 days if throat pain does not improve.  Reviewed urinalysis results with patient- trace blood but otherwise normal/unremarkable. No distinct UTI. Did not send urine for culture. Recommend follow-up with her GYN regarding urinary symptoms.  Final Clinical Impressions(s) / UC Diagnoses   Final diagnoses:  Sore throat  Acute ear pain, right  Dysuria  Urinary frequency     Discharge Instructions      Recommend take OTC Advil 400-600mg  every 6 hours as needed for pain. May use Viscous lidocaine- 11ml (2 teaspoons) swish and spit out every 3 to 4 hours as needed for throat pain. Recommend follow-up with your GYN regarding urinary frequency and pain and here in 3 to 4 days if throat pain does not improve.      ED Prescriptions     Medication Sig Dispense Auth. Provider   lidocaine (XYLOCAINE) 2 % solution Swish 4ml in the mouth/throat and spit out every 3 to 4 hours as needed for pain. 100 mL Katy Apo, NP      PDMP not reviewed this encounter.   Katy Apo, NP 03/05/21 1032

## 2021-03-11 ENCOUNTER — Ambulatory Visit: Payer: 59 | Admitting: Family Medicine

## 2021-03-11 ENCOUNTER — Other Ambulatory Visit: Payer: Self-pay

## 2021-03-11 ENCOUNTER — Encounter: Payer: Self-pay | Admitting: Family Medicine

## 2021-03-11 VITALS — BP 123/73 | HR 85 | Temp 98.4°F | Wt 145.0 lb

## 2021-03-11 DIAGNOSIS — N3010 Interstitial cystitis (chronic) without hematuria: Secondary | ICD-10-CM

## 2021-03-11 DIAGNOSIS — J0101 Acute recurrent maxillary sinusitis: Secondary | ICD-10-CM

## 2021-03-11 MED ORDER — DOXYCYCLINE HYCLATE 100 MG PO TABS: 100.0000 mg | ORAL_TABLET | Freq: Two times a day (BID) | ORAL | 0 refills | Status: DC

## 2021-03-11 MED ORDER — HYDROCOD POLI-CHLORPHE POLI ER 10-8 MG/5ML PO SUER: 5.0000 mL | Freq: Two times a day (BID) | ORAL | 0 refills | Status: DC | PRN

## 2021-03-11 NOTE — Progress Notes (Signed)
BP 123/73    Pulse 85    Temp 98.4 F (36.9 C)    Wt 145 lb (65.8 kg)    SpO2 98%    BMI 22.71 kg/m    Subjective:    Patient ID: Bethany Harrison, female    DOB: [birth year], 36 y.o.   MRN: [id number]  HPI: Bethany Harrison is a 36 y.o. female  Chief Complaint  Patient presents with   Sinusitis    Patient states she has nasal congestion. Patient is not stuffy, nose is runny mucus is green and has blood. Patient has facial pressure and coughs when she lays done. Patient took [place] test Sunday and was negative, was tested for strep on 2/16 and was negative.    UPPER RESPIRATORY TRACT INFECTION Worst symptom: cough, SOB, congestion Fever: no Cough: yes Shortness of breath: yes Wheezing: no Chest pain: no Chest tightness: no Chest congestion: no Nasal congestion: yes Runny nose: yes Post nasal drip: yes Sneezing: no Sore throat: yes Swollen glands: no Sinus pressure: yes Headache: no Face pain: no Toothache: no Ear pain: no  Ear pressure: yes  Eyes red/itching:no Eye drainage/crusting: no  Vomiting: no Rash: no Fatigue: yes Sick contacts: no Strep contacts: no  Context: worse Recurrent sinusitis: no Relief with OTC cold/cough medications: no  Treatments attempted: cold/sinus, mucinex, anti-histamine, pseudoephedrine, and cough syrup   Relevant past medical, surgical, family and social history reviewed and updated as indicated. Interim medical history since our last visit reviewed. Allergies and medications reviewed and updated.  Review of Systems  Constitutional: Negative.   HENT:  Positive for congestion, postnasal drip, rhinorrhea, sinus pressure and sinus pain. Negative for dental problem, drooling, ear discharge, ear pain, facial swelling, hearing loss, mouth sores, nosebleeds, sneezing, sore throat, tinnitus, trouble swallowing and voice change.   Respiratory:  Positive for cough. Negative for apnea, choking, chest tightness, shortness of breath, wheezing and stridor.    Cardiovascular: Negative.   Gastrointestinal: Negative.   Musculoskeletal: Negative.   Skin: Negative.   Neurological: Negative.   Psychiatric/Behavioral: Negative.     Per HPI unless specifically indicated above     Objective:    BP 123/73    Pulse 85    Temp 98.4 F (36.9 C)    Wt 145 lb (65.8 kg)    SpO2 98%    BMI 22.71 kg/m   Wt Readings from Last 3 Encounters:  03/11/21 145 lb (65.8 kg)  02/17/21 149 lb 9.6 oz (67.9 kg)  11/20/20 151 lb 3.2 oz (68.6 kg)    Physical Exam Vitals and nursing note reviewed.  Constitutional:      General: She is not in acute distress.    Appearance: Normal appearance. She is normal weight. She is not ill-appearing, toxic-appearing or diaphoretic.  HENT:     Head: Normocephalic and atraumatic.     Right Ear: Tympanic membrane, ear canal and external ear normal.     Left Ear: Tympanic membrane, ear canal and external ear normal.     Nose: Congestion and rhinorrhea present.     Mouth/Throat:     Mouth: Mucous membranes are moist.     Pharynx: Oropharynx is clear. No oropharyngeal exudate or posterior oropharyngeal erythema.  Eyes:     General: No scleral icterus.       Right eye: No discharge.        Left eye: No discharge.     Extraocular Movements: Extraocular movements intact.     Conjunctiva/sclera: Conjunctivae  normal.     Pupils: Pupils are equal, round, and reactive to light.  Cardiovascular:     Rate and Rhythm: Normal rate and regular rhythm.     Pulses: Normal pulses.     Heart sounds: Normal heart sounds. No murmur heard.   No friction rub. No gallop.  Pulmonary:     Effort: Pulmonary effort is normal. No respiratory distress.     Breath sounds: Normal breath sounds. No stridor. No wheezing, rhonchi or rales.  Chest:     Chest wall: No tenderness.  Musculoskeletal:        General: Normal range of motion.     Cervical back: Normal range of motion and neck supple.  Skin:    General: Skin is warm and dry.     Capillary  Refill: Capillary refill takes less than 2 seconds.     Coloration: Skin is not jaundiced or pale.     Findings: No bruising, erythema, lesion or rash.  Neurological:     General: No focal deficit present.     Mental Status: She is alert and oriented to person, place, and time. Mental status is at baseline.  Psychiatric:        Mood and Affect: Mood normal.        Behavior: Behavior normal.        Thought Content: Thought content normal.        Judgment: Judgment normal.    Results for orders placed or performed during the hospital encounter of 03/04/21  Group A Strep by PCR   Specimen: Urine, Clean Catch; Sterile Swab  Result Value Ref Range   Group A Strep by PCR NOT DETECTED NOT DETECTED  Urinalysis, Routine w reflex microscopic Urine, Clean Catch  Result Value Ref Range   Color, Urine YELLOW YELLOW   APPearance CLEAR CLEAR   Specific Gravity, Urine 1.015 1.005 - 1.030   pH 6.0 5.0 - 8.0   Glucose, UA NEGATIVE NEGATIVE mg/dL   Hgb urine dipstick TRACE (A) NEGATIVE   Bilirubin Urine NEGATIVE NEGATIVE   Ketones, ur NEGATIVE NEGATIVE mg/dL   Protein, ur NEGATIVE NEGATIVE mg/dL   Nitrite NEGATIVE NEGATIVE   Leukocytes,Ua NEGATIVE NEGATIVE  Urinalysis, Microscopic (reflex)  Result Value Ref Range   RBC / HPF 6-10 0 - 5 RBC/hpf   WBC, UA 0-5 0 - 5 WBC/hpf   Bacteria, UA NONE SEEN NONE SEEN   Squamous Epithelial / LPF 0-5 0 - 5      Assessment & Plan:   Problem List Items Addressed This Visit       Genitourinary   Interstitial cystitis    Would like to see urology again. Will call if she needs a referral.      Other Visit Diagnoses     Acute recurrent maxillary sinusitis    -  Primary   Will treat with doxycycline. Call with any concerns or if not getting better.    Relevant Medications   chlorpheniramine-HYDROcodone (TUSSIONEX PENNKINETIC ER) 10-8 MG/5ML   doxycycline (VIBRA-TABS) 100 MG tablet        Follow up plan: Return if symptoms worsen or fail to  improve.

## 2021-03-11 NOTE — Assessment & Plan Note (Signed)
Would like to see urology again. Will call if she needs a referral.

## 2021-03-18 ENCOUNTER — Telehealth: Payer: 59 | Admitting: Family Medicine

## 2021-03-18 ENCOUNTER — Encounter: Payer: Self-pay | Admitting: Family Medicine

## 2021-03-18 DIAGNOSIS — F339 Major depressive disorder, recurrent, unspecified: Secondary | ICD-10-CM

## 2021-03-18 NOTE — Progress Notes (Signed)
Pt scheduled  

## 2021-03-18 NOTE — Assessment & Plan Note (Signed)
Doing better on the effexor. Will give her a little more time to see about going up on it. Check in by mychart in 2 weeks. Call with any concerns. Discussed mindfulness apps and bedtime routines for insomnia. Call with any concerns.  ?

## 2021-03-18 NOTE — Patient Instructions (Signed)
The Mindfulness App.  ?Headspace.  ?Calm.  ?MINDBODY.  ?Buddhify.  ?Insight Timer.  ?Smiling Mind.  ?Meditation Timer Pro.  ?Aura  ? ?

## 2021-03-18 NOTE — Progress Notes (Signed)
? ?There were no vitals taken for this visit.  ? ?Subjective:  ? ? Patient ID: Bethany Harrison, female    DOB: 04-17-1985, 36 y.o.   MRN: [id number] ? ?HPI: ?Bethany Harrison is a 36 y.o. female ? ?Chief Complaint  ?Patient presents with  ? Medication review  ?  Here for a follow up on new medication Effexor XR started at last visit. Patient states that she is feeling better.   ? ?DEPRESSION ?Mood status: better ?Satisfied with current treatment?: yes ?Symptom severity: moderate  ?Duration of current treatment :  about a week ?Side effects: no ?Medication compliance: excellent compliance ?Psychotherapy/counseling: no  ?Previous psychiatric medications: effexor ?Depressed mood: yes ?Anxious mood: no ?Anhedonia: no ?Significant weight loss or gain: no ?Insomnia: no  ?Fatigue: no ?Feelings of worthlessness or guilt: no ?Impaired concentration/indecisiveness: no ?Suicidal ideations: no ?Hopelessness: no ?Crying spells: no ?Depression screen [facility] 2/9 03/18/2021 02/17/2021 11/20/2020 06/25/2020 05/27/2020  ?Decreased Interest 1 3 1 2 1   ?Down, Depressed, Hopeless 2 2 1 2 1   ?PHQ - 2 Score 3 5 2 4 2   ?Altered sleeping 3 3 1 2 1   ?Tired, decreased energy 2 2 1 2 1   ?Change in appetite 0 1 0 0 1  ?Feeling bad or failure about yourself  1 1 0 0 0  ?Trouble concentrating 1 1 0 0 0  ?Moving slowly or fidgety/restless 1 0 0 0 2  ?Suicidal thoughts 0 0 0 0 0  ?PHQ-9 Score 11 13 4 8 7   ?Difficult doing work/chores Somewhat difficult - Somewhat difficult Not difficult at all -  ? ? ?Relevant past medical, surgical, family and social history reviewed and updated as indicated. Interim medical history since our last visit reviewed. ?Allergies and medications reviewed and updated. ? ?Review of Systems  ?Constitutional: Negative.   ?Respiratory: Negative.    ?Cardiovascular: Negative.   ?Gastrointestinal: Negative.   ?Psychiatric/Behavioral:  Positive for dysphoric mood. Negative for agitation, behavioral problems, confusion, decreased concentration,  hallucinations, self-injury, sleep disturbance and suicidal ideas. The patient is nervous/anxious. The patient is not hyperactive.   ? ?Per HPI unless specifically indicated above ? ?   ?Objective:  ?  ?There were no vitals taken for this visit.  ?Wt Readings from Last 3 Encounters:  ?03/11/21 145 lb (65.8 kg)  ?02/17/21 149 lb 9.6 oz (67.9 kg)  ?11/20/20 151 lb 3.2 oz (68.6 kg)  ?  ?Physical Exam ?Vitals and nursing note reviewed.  ?Constitutional:   ?   General: She is not in acute distress. ?   Appearance: Normal appearance. She is not ill-appearing, toxic-appearing or diaphoretic.  ?HENT:  ?   Head: Normocephalic and atraumatic.  ?   Right Ear: External ear normal.  ?   Left Ear: External ear normal.  ?   Nose: Nose normal.  ?   Mouth/Throat:  ?   Mouth: Mucous membranes are moist.  ?   Pharynx: Oropharynx is clear.  ?Eyes:  ?   General: No scleral icterus.    ?   Right eye: No discharge.     ?   Left eye: No discharge.  ?   Conjunctiva/sclera: Conjunctivae normal.  ?   Pupils: Pupils are equal, round, and reactive to light.  ?Pulmonary:  ?   Effort: Pulmonary effort is normal. No respiratory distress.  ?   Comments: Speaking in full sentences ?Musculoskeletal:     ?   General: Normal range of motion.  ?   Cervical back: Normal  range of motion.  ?Skin: ?   Coloration: Skin is not jaundiced or pale.  ?   Findings: No bruising, erythema, lesion or rash.  ?Neurological:  ?   Mental Status: She is alert and oriented to person, place, and time. Mental status is at baseline.  ?Psychiatric:     ?   Mood and Affect: Mood normal.     ?   Behavior: Behavior normal.     ?   Thought Content: Thought content normal.     ?   Judgment: Judgment normal.  ? ? ?Results for orders placed or performed during the hospital encounter of 03/04/21  ?Group A Strep by PCR  ? Specimen: Urine, Clean Catch; Sterile Swab  ?Result Value Ref Range  ? Group A Strep by PCR NOT DETECTED NOT DETECTED  ?Urinalysis, Routine w reflex microscopic  Urine, Clean Catch  ?Result Value Ref Range  ? Color, Urine YELLOW YELLOW  ? APPearance CLEAR CLEAR  ? Specific Gravity, Urine 1.015 1.005 - 1.030  ? pH 6.0 5.0 - 8.0  ? Glucose, UA NEGATIVE NEGATIVE mg/dL  ? Hgb urine dipstick TRACE (A) NEGATIVE  ? Bilirubin Urine NEGATIVE NEGATIVE  ? Ketones, ur NEGATIVE NEGATIVE mg/dL  ? Protein, ur NEGATIVE NEGATIVE mg/dL  ? Nitrite NEGATIVE NEGATIVE  ? Leukocytes,Ua NEGATIVE NEGATIVE  ?Urinalysis, Microscopic (reflex)  ?Result Value Ref Range  ? RBC / HPF 6-10 0 - 5 RBC/hpf  ? WBC, UA 0-5 0 - 5 WBC/hpf  ? Bacteria, UA NONE SEEN NONE SEEN  ? Squamous Epithelial / LPF 0-5 0 - 5  ? ?   ?Assessment & Plan:  ? ?Problem List Items Addressed This Visit   ? ?  ? Other  ? Depression, recurrent ([place])  ?  Doing better on the effexor. Will give her a little more time to see about going up on it. Check in by mychart in 2 weeks. Call with any concerns. Discussed mindfulness apps and bedtime routines for insomnia. Call with any concerns.  ?  ?  ?  ? ?Follow up plan: ?Return in about 6 months (around 09/18/2021). ? ? ?This visit was completed via video visit through MyChart due to the restrictions of the COVID-19 pandemic. All issues as above were discussed and addressed. Physical exam was done as above through visual confirmation on video through MyChart. If it was felt that the patient should be evaluated in the office, they were directed there. The patient verbally consented to this visit. ?Location of the patient: home ?Location of the provider: work ?Those involved with this call:  ?Provider: Park Liter, DO ?CMA: Frazier Butt, CMA ?Front Desk/Registration: [employer]  ?Time spent on call:  15 minutes with patient face to face via video conference. More than 50% of this time was spent in counseling and coordination of care. 23 minutes total spent in review of patient's record and preparation of their chart. ? ? ? ? ?

## 2021-04-05 ENCOUNTER — Encounter: Payer: Self-pay | Admitting: Family Medicine

## 2021-04-05 MED ORDER — VENLAFAXINE HCL ER 150 MG PO CP24: 150.0000 mg | ORAL_CAPSULE | Freq: Every day | ORAL | 2 refills | Status: DC

## 2021-04-05 NOTE — Telephone Encounter (Signed)
1 month follow up. Virtual OK please.  ?

## 2021-04-05 NOTE — Telephone Encounter (Signed)
Appt scheduled

## 2021-05-06 ENCOUNTER — Telehealth: Payer: 59 | Admitting: Family Medicine

## 2021-05-06 ENCOUNTER — Encounter: Payer: Self-pay | Admitting: Family Medicine

## 2021-05-06 DIAGNOSIS — F339 Major depressive disorder, recurrent, unspecified: Secondary | ICD-10-CM | POA: Diagnosis not present

## 2021-05-06 MED ORDER — ALPRAZOLAM 0.25 MG PO TABS: 0.2500 mg | ORAL_TABLET | Freq: Every day | ORAL | 0 refills | Status: DC | PRN

## 2021-05-06 MED ORDER — VENLAFAXINE HCL ER 150 MG PO CP24: 150.0000 mg | ORAL_CAPSULE | Freq: Every day | ORAL | 1 refills | Status: DC

## 2021-05-06 NOTE — Assessment & Plan Note (Addendum)
Under good control on current regimen. Continue current regimen. Continue to monitor. Call with any concerns. Refills given.  ?Will give small course of PRN xanax for severe anxiety and travel. 15 pills should last at least a year.  ?

## 2021-05-06 NOTE — Progress Notes (Signed)
? ?There were no vitals taken for this visit.  ? ?Subjective:  ? ? Patient ID: Bethany Harrison, female    DOB: 1985-11-21, 36 y.o.   MRN: [id number] ? ?HPI: ?Bethany Harrison is a 36 y.o. female ? ?Chief Complaint  ?Patient presents with  ? Depression  ? Anxiety  ? ?ANXIETY/DEPRESSION ?Duration: chronic ?Status:better ?Anxious mood: yes  ?Excessive worrying: yes ?Irritability: yes  ?Sweating: no ?Nausea: no ?Palpitations:no ?Hyperventilation: no ?Panic attacks: no ?Agoraphobia: no  ?Obscessions/compulsions: no ?Depressed mood: no ? ?  05/06/2021  ?  4:05 PM 03/18/2021  ?  4:12 PM 02/17/2021  ?  8:17 AM 11/20/2020  ?  8:16 AM 06/25/2020  ?  2:14 PM  ?Depression screen PHQ 2/9  ?Decreased Interest 1 1 3 1 2   ?Down, Depressed, Hopeless 1 2 2 1 2   ?PHQ - 2 Score 2 3 5 2 4   ?Altered sleeping 1 3 3 1 2   ?Tired, decreased energy 0 2 2 1 2   ?Change in appetite 0 0 1 0 0  ?Feeling bad or failure about yourself  0 1 1 0 0  ?Trouble concentrating 1 1 1  0 0  ?Moving slowly or fidgety/restless 0 1 0 0 0  ?Suicidal thoughts 0 0 0 0 0  ?PHQ-9 Score 4 11 13 4 8   ?Difficult doing work/chores Not difficult at all Somewhat difficult  Somewhat difficult Not difficult at all  ? ? ?  05/06/2021  ?  4:06 PM 03/18/2021  ?  4:13 PM 02/17/2021  ?  8:17 AM 11/20/2020  ?  8:17 AM  ?GAD 7 : Generalized Anxiety Score  ?Nervous, Anxious, on Edge 2 2 2 1   ?Control/stop worrying 1 2 2 2   ?Worry too much - different things 1 2 2 1   ?Trouble relaxing 1 2 2 1   ?Restless 1 0 1 0  ?Easily annoyed or irritable 1 3 3 1   ?Afraid - awful might happen 0 0 0 0  ?Total GAD 7 Score 7 11 12 6   ?Anxiety Difficulty Somewhat difficult Somewhat difficult Very difficult Somewhat difficult  ? ?Anhedonia: no ?Weight changes: no ?Insomnia: no   ?Hypersomnia: no ?Fatigue/loss of energy: yes ?Feelings of worthlessness: no ?Feelings of guilt: yes ?Impaired concentration/indecisiveness: no ?Suicidal ideations: no  ?Crying spells: no ?Recent Stressors/Life Changes: yes ?  Relationship problems:  no ?  Family stress: no   ?  Financial stress: no  ?  Job stress: no  ?  Recent death/loss: no ? ?Relevant past medical, surgical, family and social history reviewed and updated as indicated. Interim medical history since our last visit reviewed. ?Allergies and medications reviewed and updated. ? ?Review of Systems  ?Constitutional: Negative.   ?Respiratory: Negative.    ?Cardiovascular: Negative.   ?Musculoskeletal: Negative.   ?Neurological: Negative.   ?Psychiatric/Behavioral: Negative.    ? ?Per HPI unless specifically indicated above ? ?   ?Objective:  ?  ?There were no vitals taken for this visit.  ?Wt Readings from Last 3 Encounters:  ?03/11/21 145 lb (65.8 kg)  ?02/17/21 149 lb 9.6 oz (67.9 kg)  ?11/20/20 151 lb 3.2 oz (68.6 kg)  ?  ?Physical Exam ?Vitals and nursing note reviewed.  ?Constitutional:   ?   General: She is not in acute distress. ?   Appearance: Normal appearance. She is not ill-appearing, toxic-appearing or diaphoretic.  ?HENT:  ?   Head: Normocephalic and atraumatic.  ?   Right Ear: External ear normal.  ?   Left  Ear: External ear normal.  ?   Nose: Nose normal.  ?   Mouth/Throat:  ?   Mouth: Mucous membranes are moist.  ?   Pharynx: Oropharynx is clear.  ?Eyes:  ?   General: No scleral icterus.    ?   Right eye: No discharge.     ?   Left eye: No discharge.  ?   Conjunctiva/sclera: Conjunctivae normal.  ?   Pupils: Pupils are equal, round, and reactive to light.  ?Pulmonary:  ?   Effort: Pulmonary effort is normal. No respiratory distress.  ?   Comments: Speaking in full sentences ?Musculoskeletal:     ?   General: Normal range of motion.  ?   Cervical back: Normal range of motion.  ?Skin: ?   Coloration: Skin is not jaundiced or pale.  ?   Findings: No bruising, erythema, lesion or rash.  ?Neurological:  ?   Mental Status: She is alert and oriented to person, place, and time. Mental status is at baseline.  ?Psychiatric:     ?   Mood and Affect: Mood normal.     ?   Behavior: Behavior  normal.     ?   Thought Content: Thought content normal.     ?   Judgment: Judgment normal.  ? ? ?Results for orders placed or performed during the hospital encounter of 03/04/21  ?Group A Strep by PCR  ? Specimen: Urine, Clean Catch; Sterile Swab  ?Result Value Ref Range  ? Group A Strep by PCR NOT DETECTED NOT DETECTED  ?Urinalysis, Routine w reflex microscopic Urine, Clean Catch  ?Result Value Ref Range  ? Color, Urine YELLOW YELLOW  ? APPearance CLEAR CLEAR  ? Specific Gravity, Urine 1.015 1.005 - 1.030  ? pH 6.0 5.0 - 8.0  ? Glucose, UA NEGATIVE NEGATIVE mg/dL  ? Hgb urine dipstick TRACE (A) NEGATIVE  ? Bilirubin Urine NEGATIVE NEGATIVE  ? Ketones, ur NEGATIVE NEGATIVE mg/dL  ? Protein, ur NEGATIVE NEGATIVE mg/dL  ? Nitrite NEGATIVE NEGATIVE  ? Leukocytes,Ua NEGATIVE NEGATIVE  ?Urinalysis, Microscopic (reflex)  ?Result Value Ref Range  ? RBC / HPF 6-10 0 - 5 RBC/hpf  ? WBC, UA 0-5 0 - 5 WBC/hpf  ? Bacteria, UA NONE SEEN NONE SEEN  ? Squamous Epithelial / LPF 0-5 0 - 5  ? ?   ?Assessment & Plan:  ? ?Problem List Items Addressed This Visit   ? ?  ? Other  ? Depression, recurrent ([place])  ?  Under good control on current regimen. Continue current regimen. Continue to monitor. Call with any concerns. Refills given.  ?Will give small course of PRN xanax for severe anxiety and travel. 15 pills should last at least a year.  ? ?  ?  ? Relevant Medications  ? venlafaxine XR (EFFEXOR XR) 150 MG 24 hr capsule  ? ALPRAZolam (XANAX) 0.25 MG tablet  ?  ? ?Follow up plan: ?Return in about 6 months (around 11/05/2021) for physical . ? ? ? ?This visit was completed via video visit through MyChart due to the restrictions of the COVID-19 pandemic. All issues as above were discussed and addressed. Physical exam was done as above through visual confirmation on video through MyChart. If it was felt that the patient should be evaluated in the office, they were directed there. The patient verbally consented to this visit. ?Location of  the patient: home ?Location of the provider: work ?Those involved with this call:  ?Provider: Park Liter, DO ?  CMA: Louanna Raw, CMA ?Front Desk/Registration: [employer]  ?Time spent on call:  15 minutes with patient face to face via video conference. More than 50% of this time was spent in counseling and coordination of care. 23 minutes total spent in review of patient's record and preparation of their chart. ? ? ? ?

## 2021-06-09 ENCOUNTER — Other Ambulatory Visit: Payer: Self-pay | Admitting: Family Medicine

## 2021-06-10 NOTE — Telephone Encounter (Signed)
Requested medication (s) are due for refill today:   No  Requested medication (s) are on the active medication list:   Yes  Future visit scheduled:   Yes   Last ordered: 02/17/2021 #30, 3 refills  Returned because no more refills remaining.   Requested Prescriptions  Pending Prescriptions Disp Refills   traZODone (DESYREL) 50 MG tablet [Pharmacy Med Name: TRAZODONE 50MG TABLETS] 30 tablet 3    Sig: TAKE 1/2 TO 1 TABLET(25 TO 50 MG) BY MOUTH AT BEDTIME AS NEEDED FOR SLEEP     Psychiatry: Antidepressants - Serotonin Modulator Passed - 06/09/2021  8:00 AM      Passed - Completed PHQ-2 or PHQ-9 in the last 360 days      Passed - Valid encounter within last 6 months    Recent Outpatient Visits           1 month ago Depression, recurrent ([place])   [place], Megan P, DO   2 months ago Depression, recurrent ([place])   [place], Megan P, DO   3 months ago Acute recurrent maxillary sinusitis   [place], Megan P, DO   3 months ago Routine general medical examination at a health care facility   [place], [place], DO   9 months ago [place], [place], DO       Future Appointments             In 3 months Wynetta Emery, Barb Merino, DO [facility], [place]   In 5 months Amalia Hailey, Nyoka Lint, MD [facility]

## 2021-07-07 ENCOUNTER — Encounter: Payer: Self-pay | Admitting: Family Medicine

## 2021-07-08 NOTE — Telephone Encounter (Signed)
Can we please see what's going on with her pharmacy?

## 2021-07-09 MED ORDER — TOPIRAMATE 100 MG PO TABS: 100.0000 mg | ORAL_TABLET | Freq: Every day | ORAL | 1 refills | Status: DC

## 2021-08-02 ENCOUNTER — Encounter: Payer: Self-pay | Admitting: Family Medicine

## 2021-08-02 ENCOUNTER — Ambulatory Visit: Payer: 59 | Admitting: Family Medicine

## 2021-08-02 VITALS — BP 116/78 | HR 53 | Temp 98.1°F | Ht 67.01 in | Wt 145.8 lb

## 2021-08-02 DIAGNOSIS — R42 Dizziness and giddiness: Secondary | ICD-10-CM | POA: Diagnosis not present

## 2021-08-02 DIAGNOSIS — G43009 Migraine without aura, not intractable, without status migrainosus: Secondary | ICD-10-CM | POA: Diagnosis not present

## 2021-08-02 MED ORDER — NURTEC 75 MG PO TBDP: 75.0000 mg | ORAL_TABLET | Freq: Every day | ORAL | 12 refills | Status: DC | PRN

## 2021-08-02 NOTE — Patient Instructions (Signed)
[email or web address]

## 2021-08-02 NOTE — Assessment & Plan Note (Signed)
No nystagmus. Concern for vestibular migraine. No better with chiropractry or ENT. Will check labs and MRI to r/o vestibular neuroma. Await results. Continue topamax. Will start nurtec PRN and recheck 1 month. If not improving or needing too often consider emgality.

## 2021-08-02 NOTE — Progress Notes (Signed)
BP 116/78   Pulse (!) 53   Temp 98.1 F (36.7 C) (Oral)   Ht 5' 7.01" (1.702 m)   Wt 145 lb 12.8 oz (66.1 kg)   SpO2 99%   BMI 22.83 kg/m    Subjective:    Patient ID: Bethany Harrison, female    DOB: 04-Nov-1985, 36 y.o.   MRN: [id number]  HPI: Bethany Harrison is a 36 y.o. female  Chief Complaint  Patient presents with   Dizziness    Has been occurring more often than before. The dizziness is not leading to a migraine like normal. Patient has seen her neurologist already for this   DIZZINESS- used to be that she would have migraines and dizziness around her cycle even with the IUD, but now, it;s happening several times a week and getting a lot worse.  Duration: 2 months Description of symptoms: lightheaded Duration of episode: dizziness Dizziness frequency: 1-2x a week Provoking factors: menses Aggravating factors:  meneses, moving her head Triggered by rolling over in bed: yes Triggered by bending over: yes Aggravated by head movement: yes Aggravated by exertion, coughing, loud noises: no Recent head injury: no Recent or current viral symptoms: no History of vasovagal episodes: no Nausea: yes Vomiting: yes Tinnitus: no Hearing loss: yes Aural fullness: yes Headache: yes Photophobia/phonophobia: no Unsteady gait: yes Postural instability: yes Diplopia, dysarthria, dysphagia or weakness: no Related to exertion: no Pallor: no Diaphoresis: no Dyspnea: no Chest pain: no Confusion: yes  Tried ajovy and emgality, nurtec  Relevant past medical, surgical, family and social history reviewed and updated as indicated. Interim medical history since our last visit reviewed. Allergies and medications reviewed and updated.  Review of Systems  Constitutional: Negative.   Respiratory: Negative.    Cardiovascular: Negative.   Gastrointestinal: Negative.   Musculoskeletal: Negative.   Neurological:  Positive for dizziness, light-headedness and headaches. Negative for tremors,  seizures, syncope, facial asymmetry, speech difficulty, weakness and numbness.  Psychiatric/Behavioral: Negative.  Negative for agitation, behavioral problems, confusion, decreased concentration, dysphoric mood, hallucinations, self-injury, sleep disturbance and suicidal ideas. The patient is not nervous/anxious and is not hyperactive.     Per HPI unless specifically indicated above     Objective:    BP 116/78   Pulse (!) 53   Temp 98.1 F (36.7 C) (Oral)   Ht 5' 7.01" (1.702 m)   Wt 145 lb 12.8 oz (66.1 kg)   SpO2 99%   BMI 22.83 kg/m   Wt Readings from Last 3 Encounters:  08/02/21 145 lb 12.8 oz (66.1 kg)  03/11/21 145 lb (65.8 kg)  02/17/21 149 lb 9.6 oz (67.9 kg)    Physical Exam Vitals and nursing note reviewed.  Constitutional:      General: She is not in acute distress.    Appearance: Normal appearance. She is normal weight. She is not ill-appearing, toxic-appearing or diaphoretic.  HENT:     Head: Normocephalic and atraumatic.     Right Ear: External ear normal.     Left Ear: External ear normal.     Nose: Nose normal.     Mouth/Throat:     Mouth: Mucous membranes are moist.     Pharynx: Oropharynx is clear.  Eyes:     General: No scleral icterus.       Right eye: No discharge.        Left eye: No discharge.     Extraocular Movements: Extraocular movements intact.     Conjunctiva/sclera: Conjunctivae normal.  Pupils: Pupils are equal, round, and reactive to light.  Cardiovascular:     Rate and Rhythm: Normal rate and regular rhythm.     Pulses: Normal pulses.     Heart sounds: Normal heart sounds. No murmur heard.    No friction rub. No gallop.  Pulmonary:     Effort: Pulmonary effort is normal. No respiratory distress.     Breath sounds: Normal breath sounds. No stridor. No wheezing, rhonchi or rales.  Chest:     Chest wall: No tenderness.  Musculoskeletal:        General: Normal range of motion.     Cervical back: Normal range of motion and neck  supple.  Skin:    General: Skin is warm and dry.     Capillary Refill: Capillary refill takes less than 2 seconds.     Coloration: Skin is not jaundiced or pale.     Findings: No bruising, erythema, lesion or rash.  Neurological:     General: No focal deficit present.     Mental Status: She is alert and oriented to person, place, and time. Mental status is at baseline.  Psychiatric:        Mood and Affect: Mood normal.        Behavior: Behavior normal.        Thought Content: Thought content normal.        Judgment: Judgment normal.     Results for orders placed or performed during the hospital encounter of 03/04/21  Group A Strep by PCR   Specimen: Urine, Clean Catch; Sterile Swab  Result Value Ref Range   Group A Strep by PCR NOT DETECTED NOT DETECTED  Urinalysis, Routine w reflex microscopic Urine, Clean Catch  Result Value Ref Range   Color, Urine YELLOW YELLOW   APPearance CLEAR CLEAR   Specific Gravity, Urine 1.015 1.005 - 1.030   pH 6.0 5.0 - 8.0   Glucose, UA NEGATIVE NEGATIVE mg/dL   Hgb urine dipstick TRACE (A) NEGATIVE   Bilirubin Urine NEGATIVE NEGATIVE   Ketones, ur NEGATIVE NEGATIVE mg/dL   Protein, ur NEGATIVE NEGATIVE mg/dL   Nitrite NEGATIVE NEGATIVE   Leukocytes,Ua NEGATIVE NEGATIVE  Urinalysis, Microscopic (reflex)  Result Value Ref Range   RBC / HPF 6-10 0 - 5 RBC/hpf   WBC, UA 0-5 0 - 5 WBC/hpf   Bacteria, UA NONE SEEN NONE SEEN   Squamous Epithelial / LPF 0-5 0 - 5      Assessment & Plan:   Problem List Items Addressed This Visit       Cardiovascular and Mediastinum   Migraine without aura and without status migrainosus, not intractable    No nystagmus. Concern for vestibular migraine. No better with chiropractry or ENT. Will check labs and MRI to r/o vestibular neuroma. Await results. Continue topamax. Will start nurtec PRN and recheck 1 month. If not improving or needing too often consider emgality.       Relevant Medications   Rimegepant  Sulfate (NURTEC) 75 MG TBDP   Other Relevant Orders   MR Brain W Wo Contrast   Ambulatory referral to Neurology   Other Visit Diagnoses     Dizziness    -  Primary   No nystagmus. Concern for vestibular migraine. No better with chiropractry or ENT. Will check labs and MRI to r/o vestibular neuroma. Await results.    Relevant Orders   MR Brain W Wo Contrast   Ambulatory referral to Neurology   Comprehensive metabolic panel  TSH   CBC with Differential/Platelet   VITAMIN D 25 Hydroxy (Vit-D Deficiency, Fractures)   Lyme Disease Serology w/Reflex   Rocky mtn spotted fvr abs pnl(IgG+IgM)   Babesia microti Antibody Panel   Ehrlichia Antibody Panel        Follow up plan: Return in about 4 weeks (around 08/30/2021).

## 2021-08-05 ENCOUNTER — Encounter: Payer: Self-pay | Admitting: Obstetrics and Gynecology

## 2021-08-05 ENCOUNTER — Ambulatory Visit (INDEPENDENT_AMBULATORY_CARE_PROVIDER_SITE_OTHER): Payer: 59 | Admitting: Obstetrics and Gynecology

## 2021-08-05 VITALS — BP 118/77 | HR 92 | Ht 66.5 in | Wt 147.4 lb

## 2021-08-05 DIAGNOSIS — Z975 Presence of (intrauterine) contraceptive device: Secondary | ICD-10-CM

## 2021-08-05 DIAGNOSIS — N809 Endometriosis, unspecified: Secondary | ICD-10-CM | POA: Diagnosis not present

## 2021-08-05 DIAGNOSIS — R102 Pelvic and perineal pain: Secondary | ICD-10-CM

## 2021-08-05 DIAGNOSIS — N921 Excessive and frequent menstruation with irregular cycle: Secondary | ICD-10-CM

## 2021-08-05 NOTE — Progress Notes (Signed)
HPI:      [name] is a 36 y.o. G0P0 who LMP was No LMP recorded (lmp unknown). (Menstrual status: IUD).  Subjective:   She presents today to discuss definitive surgery.  She has forced herself to wait 8 months and continue to consider her options.  She continues to have breakthrough bleeding and pain especially right-sided despite a Mirena IUD.  She has a long history of endometriosis with multiple surgeries.  She has previously tried Lupron, OCPs, and IUD without success. She states that childbearing is not something she desires.  If she wants a child she and her husband have decided to adopt.    Hx: The following portions of the patient's history were reviewed and updated as appropriate:             She  has a past medical history of Abdominal pain, Anxiety, Chronic kidney disease, Depression, Endometriosis, Endometriosis, GERD (gastroesophageal reflux disease), Headache, Hemorrhoid, Migraine, and Ulcerative colitis ([place]). She does not have any pertinent problems on file. She  has a past surgical history that includes Bunionectomy (08, 10); Tonsillectomy; Dilation and curettage of uterus (2013); Appendectomy (2013); Pelvic laparoscopy (2009); Colonoscopy (2014); Intrauterine device insertion (06/24/2011); Tonsillectomy; Diagnostic laparoscopy; Hernia repair (89); Chromopertubation (N/A, 06/30/2014); laparoscopy (N/A, 06/30/2014); IUD removal (N/A, 06/30/2014); and Breast cyst excision (Right, 2017). Her family history includes Brain cancer in her maternal grandfather; Breast cancer (age of onset: 32) in her mother; Cancer in her mother and paternal grandmother; Diabetes in her maternal grandmother, mother, and paternal grandmother. She  reports that she has never smoked. She has never used smokeless tobacco. She reports current alcohol use. She reports that she does not use drugs. She has a current medication list which includes the following prescription(s): alprazolam,  butalbital-acetaminophen-caffeine, cyclobenzaprine, meclizine, ondansetron, promethazine, nurtec, topiramate, trazodone, triamcinolone acetonide, and venlafaxine xr. She is allergic to sulfa antibiotics.       Review of Systems:  Review of Systems  Constitutional: Denied constitutional symptoms, night sweats, recent illness, fatigue, fever, insomnia and weight loss.  Eyes: Denied eye symptoms, eye pain, photophobia, vision change and visual disturbance.  Ears/Nose/Throat/Neck: Denied ear, nose, throat or neck symptoms, hearing loss, nasal discharge, sinus congestion and sore throat.  Cardiovascular: Denied cardiovascular symptoms, arrhythmia, chest pain/pressure, edema, exercise intolerance, orthopnea and palpitations.  Respiratory: Denied pulmonary symptoms, asthma, pleuritic pain, productive sputum, cough, dyspnea and wheezing.  Gastrointestinal: Denied, gastro-esophageal reflux, melena, nausea and vomiting.  Genitourinary: See HPI for additional information.  Musculoskeletal: Denied musculoskeletal symptoms, stiffness, swelling, muscle weakness and myalgia.  Dermatologic: Denied dermatology symptoms, rash and scar.  Neurologic: Denied neurology symptoms, dizziness, headache, neck pain and syncope.  Psychiatric: Denied psychiatric symptoms, anxiety and depression.  Endocrine: Denied endocrine symptoms including hot flashes and night sweats.   Meds:   Current Outpatient Medications on File Prior to Visit  Medication Sig Dispense Refill   ALPRAZolam (XANAX) 0.25 MG tablet Take 1 tablet (0.25 mg total) by mouth daily as needed. 15 tablet 0   butalbital-acetaminophen-caffeine (FIORICET) 50-325-40 MG tablet Take by mouth.     cyclobenzaprine (FLEXERIL) 5 MG tablet Take 1 tablet before bed time for 5 days, then increase to 2 tablets before bed time.     meclizine (ANTIVERT) 12.5 MG tablet Take 12.5 mg by mouth 3 (three) times daily.     ondansetron (ZOFRAN-ODT) 4 MG disintegrating tablet Take  4 mg by mouth every 8 (eight) hours as needed.     promethazine (PHENERGAN) 25 MG/ML injection  Inject into the muscle.     Rimegepant Sulfate (NURTEC) 75 MG TBDP Take 75 mg by mouth daily as needed (migraine). 10 tablet 12   topiramate (TOPAMAX) 100 MG tablet Take 1 tablet (100 mg total) by mouth daily. 90 tablet 1   traZODone (DESYREL) 50 MG tablet TAKE 1/2 TO 1 TABLET(25 TO 50 MG) BY MOUTH AT BEDTIME AS NEEDED FOR SLEEP 90 tablet 1   Triamcinolone Acetonide (NASACORT ALLERGY 24HR NA) Place into the nose.     venlafaxine XR (EFFEXOR XR) 150 MG 24 hr capsule Take 1 capsule (150 mg total) by mouth daily with breakfast. 90 capsule 1   No current facility-administered medications on file prior to visit.      Objective:     Vitals:   08/05/21 1123  BP: 118/77  Pulse: 92   Filed Weights   08/05/21 1123  Weight: 147 lb 6.4 oz (66.9 kg)                        Assessment:    G0P0 Patient Active Problem List   Diagnosis Date Noted   Depression, recurrent ([place]) 03/06/2018   Ulcerative colitis ([place]) 10/05/2017   Migraine without aura and without status migrainosus, not intractable 05/17/2016   Seasonal allergic rhinitis 05/17/2016   Interstitial cystitis 09/04/2014   Endometriosis 06/30/2014     1. Endometriosis   2. Breakthrough bleeding associated with intrauterine device (IUD)   3. Pelvic pain in female     Patient with significant issues from endometriosis with associated pain and bleeding.  Desires hysterectomy.   Plan:            1.  Once she has decided upon a timeframe for hysterectomy she will come for a preop.  We have specifically discussed oophorectomy in light of endometriosis.  Risks and benefits were reviewed.  She will decide in the near future after she does a little bit more research.  All of her questions were answered.  I believe she has a very good idea of hysterectomy and is making a well-informed definitive decision. Orders No orders of the defined  types were placed in this encounter.   No orders of the defined types were placed in this encounter.     F/U  No follow-ups on file. I spent 31 minutes involved in the care of this patient preparing to see the patient by obtaining and reviewing her medical history (including labs, imaging tests and prior procedures), documenting clinical information in the electronic health record (EHR), counseling and coordinating care plans, writing and sending prescriptions, ordering tests or procedures and in direct communicating with the patient and medical staff discussing pertinent items from her history and physical exam.  Finis Bud, M.D. 08/05/2021 12:35 PM

## 2021-08-05 NOTE — Progress Notes (Signed)
Patient presents today to discuss surgery. She states since last visit in November she has considered a hysterectomy and is ready now due to endometriosis. Patient continues to have irregular bleeding with IUD.

## 2021-08-06 LAB — CBC WITH DIFFERENTIAL/PLATELET
Basophils Absolute: 0.1 10*3/uL (ref 0.0–0.2)
Basos: 1 %
EOS (ABSOLUTE): 0.1 10*3/uL (ref 0.0–0.4)
Eos: 1 %
Hematocrit: 40.1 % (ref 34.0–46.6)
Hemoglobin: 13.4 g/dL (ref 11.1–15.9)
Immature Grans (Abs): 0 10*3/uL (ref 0.0–0.1)
Immature Granulocytes: 0 %
Lymphocytes Absolute: 3.8 10*3/uL — ABNORMAL HIGH (ref 0.7–3.1)
Lymphs: 40 %
MCH: 32.8 pg (ref 26.6–33.0)
MCHC: 33.4 g/dL (ref 31.5–35.7)
MCV: 98 fL — ABNORMAL HIGH (ref 79–97)
Monocytes Absolute: 0.5 10*3/uL (ref 0.1–0.9)
Monocytes: 5 %
Neutrophils Absolute: 5.1 10*3/uL (ref 1.4–7.0)
Neutrophils: 53 %
Platelets: 373 10*3/uL (ref 150–450)
RBC: 4.09 x10E6/uL (ref 3.77–5.28)
RDW: 11.9 % (ref 11.7–15.4)
WBC: 9.5 10*3/uL (ref 3.4–10.8)

## 2021-08-06 LAB — VITAMIN D 25 HYDROXY (VIT D DEFICIENCY, FRACTURES): Vit D, 25-Hydroxy: 50.8 ng/mL (ref 30.0–100.0)

## 2021-08-06 LAB — COMPREHENSIVE METABOLIC PANEL
ALT: 14 IU/L (ref 0–32)
AST: 18 IU/L (ref 0–40)
Albumin/Globulin Ratio: 2 (ref 1.2–2.2)
Albumin: 4.5 g/dL (ref 3.9–4.9)
Alkaline Phosphatase: 53 IU/L (ref 44–121)
BUN/Creatinine Ratio: 29 — ABNORMAL HIGH (ref 9–23)
BUN: 23 mg/dL — ABNORMAL HIGH (ref 6–20)
Bilirubin Total: 0.5 mg/dL (ref 0.0–1.2)
CO2: 25 mmol/L (ref 20–29)
Calcium: 9.9 mg/dL (ref 8.7–10.2)
Chloride: 101 mmol/L (ref 96–106)
Creatinine, Ser: 0.79 mg/dL (ref 0.57–1.00)
Globulin, Total: 2.3 g/dL (ref 1.5–4.5)
Glucose: 79 mg/dL (ref 70–99)
Potassium: 3.5 mmol/L (ref 3.5–5.2)
Sodium: 141 mmol/L (ref 134–144)
Total Protein: 6.8 g/dL (ref 6.0–8.5)
eGFR: 98 mL/min/{1.73_m2} (ref >59)

## 2021-08-06 LAB — EHRLICHIA ANTIBODY PANEL
E. Chaffeensis (HME) IgM Titer: NEGATIVE
E.Chaffeensis (HME) IgG: NEGATIVE
HGE IgG Titer: NEGATIVE
HGE IgM Titer: NEGATIVE

## 2021-08-06 LAB — ROCKY MTN SPOTTED FVR ABS PNL(IGG+IGM)
RMSF IgG: NEGATIVE
RMSF IgM: 0.5 index (ref 0.00–0.89)

## 2021-08-06 LAB — LYME DISEASE SEROLOGY W/REFLEX: Lyme Total Antibody EIA: NEGATIVE

## 2021-08-06 LAB — BABESIA MICROTI ANTIBODY PANEL
Babesia microti IgG: <1:10 {titer}
Babesia microti IgM: <1:10 {titer}

## 2021-08-06 LAB — TSH: TSH: 1.26 u[IU]/mL (ref 0.450–4.500)

## 2021-08-09 ENCOUNTER — Encounter: Payer: Self-pay | Admitting: Family Medicine

## 2021-08-10 ENCOUNTER — Ambulatory Visit: Payer: 59 | Admitting: Family Medicine

## 2021-08-18 MED ORDER — MECLIZINE HCL 12.5 MG PO TABS
12.5000 mg | ORAL_TABLET | Freq: Three times a day (TID) | ORAL | 5 refills | Status: DC
Start: 2021-08-18 — End: 2022-04-20

## 2021-08-18 NOTE — Telephone Encounter (Signed)
Please push her appt to September. Thanks!

## 2021-08-23 ENCOUNTER — Encounter: Payer: Self-pay | Admitting: Family Medicine

## 2021-08-24 ENCOUNTER — Ambulatory Visit: Payer: Self-pay | Admitting: *Deleted

## 2021-08-24 ENCOUNTER — Ambulatory Visit: Payer: 59 | Admitting: Nurse Practitioner

## 2021-08-24 NOTE — Telephone Encounter (Signed)
  Chief Complaint: insect/spider bite Symptoms: redness and swelling 4" around, now spreading upwards on anterior arm near axilla, pt short of breath Frequency: constant Pertinent Negatives: Patient denies fever Disposition: [] ED /[x] Urgent Care (no appt availability in office) / [x] Appointment(In office/virtual)/ []  [facility]/ [] Home Care/ [] Refused Recommended Disposition /[] [facility]/ []  Follow-up with PCP Additional Notes: Pt short of breath, redness has changed from a circle to also spreading upwards  toward axilla. Pt advised ED/UC. She has a driver and is going now, she is keeping afternoon appt in case she can not be seen due to crowds in [place].   Reason for Disposition  Patient sounds very sick or weak to the triager  Answer Assessment - Initial Assessment Questions 1. TYPE of INSECT: "What type of insect was it?"      unsure 2. ONSET: "When did you get bitten?"      Sunday 3. LOCATION: "Where is the insect bite located?"      Under arm 4. REDNESS: "Is the area red or pink?" If Yes, ask: "What size is area of redness?" (inches or cm). "When did the redness start?"     4" area of redness, now skin scaly 5. PAIN: "Is there any pain?" If Yes, ask: "How bad is it?"  (Scale 1-10; or mild, moderate, severe) when touched not real painful    6. ITCHING: "Does it itch?" If Yes, ask: "How bad is the itch?"    - MILD: doesn't interfere with normal activities   - MODERATE-SEVERE: interferes with work, school, sleep, or other activities      Yes, not a whole lot 7. SWELLING: "How big is the swelling?" (inches, cm, or compare to coins)     Not as swollen as yesterday, the bite area is scaly 8. OTHER SYMPTOMS: "Do you have any other symptoms?"  (e.g., difficulty breathing, hives)     Slight difficulty  9. PREGNANCY: "Is there any chance you are pregnant?" "When was your last menstrual period?"     na  Protocols used: Insect Bite-A-AH

## 2021-08-30 ENCOUNTER — Ambulatory Visit: Payer: 59 | Admitting: Family Medicine

## 2021-09-14 ENCOUNTER — Encounter: Payer: Self-pay | Admitting: Psychiatry

## 2021-09-14 ENCOUNTER — Ambulatory Visit: Payer: 59 | Admitting: Psychiatry

## 2021-09-14 VITALS — BP 100/67 | HR 58 | Ht 66.5 in | Wt 148.4 lb

## 2021-09-14 DIAGNOSIS — R42 Dizziness and giddiness: Secondary | ICD-10-CM | POA: Diagnosis not present

## 2021-09-14 DIAGNOSIS — G43019 Migraine without aura, intractable, without status migrainosus: Secondary | ICD-10-CM

## 2021-09-14 MED ORDER — EMGALITY 120 MG/ML ~~LOC~~ SOAJ: 120.0000 mg | SUBCUTANEOUS | 6 refills | Status: DC

## 2021-09-14 MED ORDER — EMGALITY 120 MG/ML ~~LOC~~ SOAJ: 240.0000 mg | Freq: Once | SUBCUTANEOUS | 0 refills | Status: AC

## 2021-09-14 MED ORDER — ZOLMITRIPTAN 5 MG NA SOLN: 1.0000 | NASAL | 6 refills | Status: DC | PRN

## 2021-09-14 NOTE — Patient Instructions (Signed)
Start Emgality 120 mg monthly for migraine prevention  Start Zomig 5 mg nasal spray as needed for migraines. Take 1 spray as needed for migraines. May repeat a dose in 2 hours if needed. Max dose 2 sprays per day

## 2021-09-14 NOTE — Progress Notes (Signed)
Referring:  Valerie Roys, DO [place],  [place] [id number]  PCP: Valerie Roys, DO  Neurology was asked to evaluate Bethany Harrison, a 36 year old female for a chief complaint of headaches.  Our recommendations of care will be communicated by shared medical record.    CC:  headaches, dizziness  History provided from self  HPI:  Medical co-morbidities: endometriosis, depression  The patient presents for evaluation of headaches and dizziness. She has had migraines since her early 93s. She has had dizziness for the past 6-12 months, but it has worsened in the past 3 months. Currently she has episodes of dizziness twice per week. Describes this as both a sensation of lightheadedness and like her body is moving. She will feel uncoordinated and trip over her feet. She will have associated fullness and ringing in her ears. Typically she will get a dizzy spell, then develop a migraine. She will have some form of a headache >50% of the time she has dizziness. Currently she has 1-2 headaches per week.  She was started on meclizine which helps but takes several hours to start working. Has tried multiple migraine medications previously. She is not sure if they were helpful but notes she did not have vertigo at the time she was using them.  Headache History: Onset: early 23s Triggers: menstrual cycle Aura: none Location: unilateral Quality/Description: throbbing Associated Symptoms:  Photophobia: yes  Phonophobia: yes  Nausea: yes Vomiting: no Other symptoms: vertigo Worse with activity?: yes Duration of headaches: several hours  Headache days per month: 8 Headache free days per month: 22  Current Treatment: Abortive Dramamine meclizine  Preventative Topamax 100 mg QHS Effexor 150 mg daily  Prior Therapies                                 Topamax 100 mg QHS Effexor amitriptyline Emgality [place] Nurtec 75 mg - lack of efficacy Imitrex - lack of  efficacy Maxalt Fioricet Zofran  LABS:    Latest Ref Rng & Units 08/02/2021    3:09 PM 02/17/2021    8:54 AM 06/26/2014   11:29 AM  CBC  WBC 3.4 - 10.8 x10E3/uL 9.5  5.3  6.3   Hemoglobin 11.1 - 15.9 g/dL 13.4  13.6  13.3   Hematocrit 34.0 - 46.6 % 40.1  41.4  39.2   Platelets 150 - 450 x10E3/uL 373  299  282       Latest Ref Rng & Units 08/02/2021    3:09 PM 02/17/2021    8:54 AM  CMP  Glucose 70 - 99 mg/dL 79  82   BUN 6 - 20 mg/dL 23  16   Creatinine 0.57 - 1.00 mg/dL 0.79  0.77   Sodium 134 - 144 mmol/L 141  137   Potassium 3.5 - 5.2 mmol/L 3.5  4.6   Chloride 96 - 106 mmol/L 101  104   CO2 20 - 29 mmol/L 25  20   Calcium 8.7 - 10.2 mg/dL 9.9  9.2   Total Protein 6.0 - 8.5 g/dL 6.8  6.8   Total Bilirubin 0.0 - 1.2 mg/dL 0.5  0.4   Alkaline Phos 44 - 121 IU/L 53  53   AST 0 - 40 IU/L 18  15   ALT 0 - 32 IU/L 14  10      IMAGING:  MRA head 11/04/20 unremarkable  MRI brain 2019  unremarkable  Imaging independently reviewed on September 14, 2021   Current Outpatient Medications on File Prior to Visit  Medication Sig Dispense Refill   ALPRAZolam (XANAX) 0.25 MG tablet Take 1 tablet (0.25 mg total) by mouth daily as needed. 15 tablet 0   butalbital-acetaminophen-caffeine (FIORICET) 50-325-40 MG tablet Take by mouth.     cyclobenzaprine (FLEXERIL) 5 MG tablet Take 1 tablet before bed time for 5 days, then increase to 2 tablets before bed time.     meclizine (ANTIVERT) 12.5 MG tablet Take 1 tablet (12.5 mg total) by mouth 3 (three) times daily. 90 tablet 5   ondansetron (ZOFRAN-ODT) 4 MG disintegrating tablet Take 4 mg by mouth every 8 (eight) hours as needed.     topiramate (TOPAMAX) 100 MG tablet Take 1 tablet (100 mg total) by mouth daily. 90 tablet 1   traZODone (DESYREL) 50 MG tablet TAKE 1/2 TO 1 TABLET(25 TO 50 MG) BY MOUTH AT BEDTIME AS NEEDED FOR SLEEP 90 tablet 1   Triamcinolone Acetonide (NASACORT ALLERGY 24HR NA) Place into the nose.     venlafaxine XR (EFFEXOR XR)  150 MG 24 hr capsule Take 1 capsule (150 mg total) by mouth daily with breakfast. 90 capsule 1   No current facility-administered medications on file prior to visit.     Allergies: Allergies  Allergen Reactions   Sulfa Antibiotics Nausea Only    Family History: Migraine or other headaches in the family:  mother has migraines Aneurysms in a first degree relative:  no Brain tumors in the family:  maternal grandfather had brain cancer Other neurological illness in the family:   no  Past Medical History: Past Medical History:  Diagnosis Date   Abdominal pain    Anxiety    Chronic kidney disease    Depression    Endometriosis    GERD (gastroesophageal reflux disease)    Headache    Hemorrhoid    Migraine    Ulcerative colitis ([place])     Past Surgical History Past Surgical History:  Procedure Laterality Date   APPENDECTOMY  2013   with laparoscopy @ [facility] Right 2017   sebaceous cyst removed   BUNIONECTOMY  08/2008   CHROMOPERTUBATION N/A 06/30/2014   Procedure: CHROMOPERTUBATION;  Surgeon: [name], MD;  Location: [facility] ORS;  Service: Gynecology;  Laterality: N/A;   COLONOSCOPY  2014   DIAGNOSTIC LAPAROSCOPY     DILATION AND CURETTAGE OF UTERUS  2013   polyp   HERNIA REPAIR  1700   umbilical   INTRAUTERINE DEVICE INSERTION  06/24/2011   Mirena IUD   IUD REMOVAL N/A 06/30/2014   Procedure: INTRAUTERINE DEVICE (IUD) REMOVAL;  Surgeon: [name], MD;  Location: [facility] ORS;  Service: Gynecology;  Laterality: N/A;   LAPAROSCOPY N/A 06/30/2014   Procedure:  with excision of endoemtriosis and biopsy;  Surgeon: [name], MD;  Location: [facility] ORS;  Service: Gynecology;  Laterality: N/A;   PELVIC LAPAROSCOPY  2009   times 2   TONSILLECTOMY      Social History: Social History   Tobacco Use   Smoking status: Never   Smokeless tobacco: Never  Vaping Use   Vaping Use: Never used  Substance Use Topics   Alcohol  use: Yes    Alcohol/week: 0.0 standard drinks of alcohol    Comment: occas   Drug use: No    ROS: Negative for fevers, chills. Positive for headaches, vertigo. All other systems reviewed and  negative unless stated otherwise in HPI.   Physical Exam:   Vital Signs: BP 100/67   Pulse (!) 58   Ht 5' 6.5" (1.689 m)   Wt 148 lb 6.4 oz (67.3 kg)   BMI 23.59 kg/m  GENERAL: well appearing,in no acute distress,alert SKIN:  Color, texture, turgor normal. No rashes or lesions HEAD:  Normocephalic/atraumatic. CV:  RRR RESP: Normal respiratory effort MSK: no tenderness to palpation over occiput, neck, or shoulders  NEUROLOGICAL: Mental Status: Alert, oriented to person, place and time,Follows commands Cranial Nerves: PERRL, visual fields intact to confrontation, extraocular movements intact, facial sensation intact, no facial droop or ptosis, hearing grossly intact, no dysarthria Motor: muscle strength 5/5 both upper and lower extremities,no drift, normal tone Reflexes: 2+ throughout Sensation: intact to light touch all 4 extremities Coordination: Finger-to- nose-finger intact bilaterally Gait: normal-based  Dix-Hallpike negative   IMPRESSION: 36 year old female with a history of endometriosis who presents for evaluation of vertigo and migraines. Dix-Hallpike testing is negative. 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 Neurological exam today is normal. Discussed imaging, but she does not feel her symptoms are significantly different from her last MRI in 2019. Will hold off on repeat imaging for now. May consider repeat MRI if her headaches/vertigo worsen despite treatment. Will restart Emgality for migraine prevention and try nasal Zomig for rescue as this is faster acting than oral triptans.  PLAN: -Prevention: Start Emgality 120 mg monthly. Continue Topamax 100 mg QHS for now -Rescue: Start Zomig 5  mg nasal spray -next steps: consider qulipta, aimovig for prevention  I spent a total of 38 minutes chart reviewing and counseling the patient. Headache education was done. Discussed treatment options including preventive and acute medications. Discussed medication side effects, adverse reactions and drug interactions. Written educational materials and patient instructions outlining all of the above were given.  Follow-up: 3 months   Genia Harold, MD 09/14/2021   2:30 PM

## 2021-09-15 ENCOUNTER — Telehealth: Payer: Self-pay | Admitting: *Deleted

## 2021-09-15 NOTE — Telephone Encounter (Signed)
Zolmitriptan spray PA, Key: [id number], [id number], faxed notes to be attached.

## 2021-09-15 NOTE — Telephone Encounter (Signed)
Received e mail: documents attached. Your information has been sent to [facility].

## 2021-09-16 NOTE — Telephone Encounter (Addendum)
Per CMM: The request for coverage for ZOLMITRIPTAN SPR 5MG, use as directed (1 per month), is denied. This decision is based on health plan criteria for ZOLMITRIPTAN SPR 5MG. This medicine is covered only if: You have failed or cannot use Brand Zomig nasal spray. Called pharmacy, spoke with Carmelina Paddock and requested he run Rx for brand zomig. It went through for $40, he will have to order it; it will arrive tomorrow and Rx will be filled.

## 2021-09-17 ENCOUNTER — Encounter: Payer: Self-pay | Admitting: Psychiatry

## 2021-09-21 ENCOUNTER — Telehealth: Payer: Self-pay | Admitting: *Deleted

## 2021-09-21 NOTE — Telephone Encounter (Signed)
Emgality PA, Key: [id number], [id number]. Your information has been sent to [facility].

## 2021-09-22 ENCOUNTER — Ambulatory Visit: Payer: 59 | Admitting: Family Medicine

## 2021-09-22 NOTE — Telephone Encounter (Signed)
The request for 2 EMGALITY INJ 120MG/ML for one month is denied. This decision is based on health plan criteria for EMGALITY INJ 120MG/ML. More than one pen for one month is covered only if: You require a one time loading dose of 240m. The information provided does not show that you meet the criteria listed above. *Please note: EMGALITY INJ 120MG/ML has been approved for up to 1 per month until 03/22/2022.

## 2021-09-22 NOTE — Telephone Encounter (Signed)
Emgality 2 pens approved till 10/06/21 x one.  Sent my chart to advise patient.

## 2021-09-22 NOTE — Telephone Encounter (Signed)
Called [facility] provider line, spoke with Cassandra who submitted PA for loading dose. New Case # [id number].

## 2021-09-27 ENCOUNTER — Other Ambulatory Visit: Payer: Self-pay | Admitting: *Deleted

## 2021-09-27 ENCOUNTER — Ambulatory Visit: Payer: 59 | Admitting: Family Medicine

## 2021-09-27 DIAGNOSIS — G43019 Migraine without aura, intractable, without status migrainosus: Secondary | ICD-10-CM

## 2021-09-27 MED ORDER — ZOLMITRIPTAN 5 MG PO TABS
5.0000 mg | ORAL_TABLET | ORAL | 6 refills | Status: DC | PRN
Start: 2021-09-27 — End: 2021-10-26

## 2021-10-14 ENCOUNTER — Ambulatory Visit: Payer: 59 | Admitting: Family Medicine

## 2021-10-18 ENCOUNTER — Ambulatory Visit
Admission: EM | Admit: 2021-10-18 | Discharge: 2021-10-18 | Disposition: A | Discharge status: Home / Self Care | Payer: 59 | Attending: Emergency Medicine | Admitting: Emergency Medicine

## 2021-10-18 ENCOUNTER — Ambulatory Visit: Payer: Self-pay | Admitting: *Deleted

## 2021-10-18 ENCOUNTER — Telehealth: Payer: Self-pay | Admitting: Family Medicine

## 2021-10-18 ENCOUNTER — Encounter: Payer: Self-pay | Admitting: Emergency Medicine

## 2021-10-18 DIAGNOSIS — G43E09 Chronic migraine with aura, not intractable, without status migrainosus: Secondary | ICD-10-CM

## 2021-10-18 DIAGNOSIS — R112 Nausea with vomiting, unspecified: Secondary | ICD-10-CM | POA: Diagnosis not present

## 2021-10-18 MED ORDER — PREDNISONE 10 MG (21) PO TBPK: ORAL_TABLET | Freq: Every day | ORAL | 0 refills | Status: DC

## 2021-10-18 MED ORDER — DIPHENHYDRAMINE HCL 50 MG/ML IJ SOLN
50.0000 mg | Freq: Once | INTRAMUSCULAR | Status: AC
  Administered 2021-10-18: 50 mg via INTRAMUSCULAR

## 2021-10-18 MED ORDER — PROMETHAZINE HCL 25 MG/ML IJ SOLN
12.5000 mg | Freq: Once | INTRAMUSCULAR | Status: AC
  Administered 2021-10-18: 12.5 mg via INTRAMUSCULAR

## 2021-10-18 MED ORDER — KETOROLAC TROMETHAMINE 60 MG/2ML IM SOLN
60.0000 mg | Freq: Once | INTRAMUSCULAR | Status: AC
  Administered 2021-10-18: 60 mg via INTRAMUSCULAR

## 2021-10-18 NOTE — Discharge Instructions (Addendum)
If symptoms persist or become worse contact your neurologist or go to the emergency room

## 2021-10-18 NOTE — ED Provider Notes (Signed)
[facility]    CSN: [id number] Arrival date & time: 10/18/21  1003      History   Chief Complaint Chief Complaint  Patient presents with   Migraine    Entered by patient    HPI Bethany Harrison is a 36 y.o. female.   Patient presents today with chronic migraine headaches.  She has been having light sensitivity nausea pain more so to the right side since yesterday.  Patient sees a neurologist for several years now.  They have tried multiple different medications with minimal relief they believe it may be more hormone related.  Patient did just get off the menstrual cycle even though she has an IUD.  Patient took her fioricet about an hour ago with no relief.  Patient states that this is similar to her other migraines.  Denies any recent illness     Past Medical History:  Diagnosis Date   Abdominal pain    Anxiety    Chronic kidney disease    Depression    Endometriosis    GERD (gastroesophageal reflux disease)    Headache    Hemorrhoid    Migraine    Ulcerative colitis [facility])     Patient Active Problem List   Diagnosis Date Noted   Depression, recurrent ([place]) 03/06/2018   Ulcerative colitis ([place]) 10/05/2017   Migraine without aura and without status migrainosus, not intractable 05/17/2016   Seasonal allergic rhinitis 05/17/2016   Interstitial cystitis 09/04/2014   Endometriosis 06/30/2014    Past Surgical History:  Procedure Laterality Date   APPENDECTOMY  2013   with laparoscopy @ [facility] Right 2017   sebaceous cyst removed   BUNIONECTOMY  08/2008   CHROMOPERTUBATION N/A 06/30/2014   Procedure: CHROMOPERTUBATION;  Surgeon: [name], MD;  Location: [facility] ORS;  Service: Gynecology;  Laterality: N/A;   COLONOSCOPY  2014   DIAGNOSTIC LAPAROSCOPY     DILATION AND CURETTAGE OF UTERUS  2013   polyp   HERNIA REPAIR  3154   umbilical   INTRAUTERINE DEVICE INSERTION  06/24/2011   Mirena IUD   IUD REMOVAL N/A  06/30/2014   Procedure: INTRAUTERINE DEVICE (IUD) REMOVAL;  Surgeon: [name], MD;  Location: [facility] ORS;  Service: Gynecology;  Laterality: N/A;   LAPAROSCOPY N/A 06/30/2014   Procedure:  with excision of endoemtriosis and biopsy;  Surgeon: [name], MD;  Location: [facility] ORS;  Service: Gynecology;  Laterality: N/A;   PELVIC LAPAROSCOPY  2009   times 2   TONSILLECTOMY      OB History     Gravida  0   Para      Term      Preterm      AB      Living         SAB      IAB      Ectopic      Multiple      Live Births               Home Medications    Prior to Admission medications   Medication Sig Start Date End Date Taking? Authorizing Provider  ALPRAZolam (XANAX) 0.25 MG tablet Take 1 tablet (0.25 mg total) by mouth daily as needed. 05/06/21  Yes Johnson, Megan P, DO  butalbital-acetaminophen-caffeine (FIORICET) [phone] MG tablet Take by mouth. 05/06/20  Yes [provider]  cyclobenzaprine (FLEXERIL) 5 MG tablet Take 1 tablet before bed time for 5  days, then increase to 2 tablets before bed time. 10/28/20 10/28/21 Yes [provider]  Galcanezumab-gnlm (EMGALITY) 120 MG/ML SOAJ Inject 120 mg into the skin every 30 (thirty) days. 09/14/21  Yes Genia Harold, MD  meclizine (ANTIVERT) 12.5 MG tablet Take 1 tablet (12.5 mg total) by mouth 3 (three) times daily. 08/18/21  Yes Johnson, Megan P, DO  ondansetron (ZOFRAN-ODT) 4 MG disintegrating tablet Take 4 mg by mouth every 8 (eight) hours as needed. 12/17/20  Yes [provider]  predniSONE (STERAPRED UNI-PAK 21 TAB) 10 MG (21) TBPK tablet Take by mouth daily. Take 6 tabs by mouth daily  for 2 days, then 5 tabs for 2 days, then 4 tabs for 2 days, then 3 tabs for 2 days, 2 tabs for 2 days, then 1 tab by mouth daily for 2 days 10/18/21  Yes Marney Setting, NP  topiramate (TOPAMAX) 100 MG tablet Take 1 tablet (100 mg total) by mouth daily. 07/09/21 07/09/22 Yes Johnson, Megan P,  DO  traZODone (DESYREL) 50 MG tablet TAKE 1/2 TO 1 TABLET(25 TO 50 MG) BY MOUTH AT BEDTIME AS NEEDED FOR SLEEP 06/10/21  Yes Johnson, Megan P, DO  Triamcinolone Acetonide (NASACORT ALLERGY 24HR NA) Place into the nose.   Yes [provider]  venlafaxine XR (EFFEXOR XR) 150 MG 24 hr capsule Take 1 capsule (150 mg total) by mouth daily with breakfast. 05/06/21  Yes Johnson, Megan P, DO  zolmitriptan (ZOMIG) 5 MG tablet Take 1 tablet (5 mg total) by mouth as needed for migraine. May repeat a dose in 2 hours if needed. Max dose 2 pills in 24 hours 09/27/21  Yes Genia Harold, MD    Family History Family History  Problem Relation Age of Onset   Breast cancer Mother 65   Cancer Mother    Diabetes Mother    Diabetes Maternal Grandmother    Brain cancer Maternal Grandfather    Cancer Paternal Grandmother        colon   Diabetes Paternal Grandmother    Heart disease Neg Hx    Ovarian cancer Neg Hx     Social History Social History   Tobacco Use   Smoking status: Never   Smokeless tobacco: Never  Vaping Use   Vaping Use: Never used  Substance Use Topics   Alcohol use: Yes    Alcohol/week: 0.0 standard drinks of alcohol    Comment: occas   Drug use: No     Allergies   Sulfa antibiotics   Review of Systems Review of Systems  Constitutional:  Negative for activity change, chills and fever.  HENT: Negative.    Respiratory: Negative.    Cardiovascular: Negative.   Gastrointestinal:  Positive for nausea. Negative for vomiting.  Genitourinary: Negative.   Neurological:  Positive for dizziness and headaches. Negative for seizures, syncope and weakness.       Patient has chronic migraines generalized pain more so on the right side     Physical Exam Triage Vital Signs ED Triage Vitals  Enc Vitals Group     BP 10/18/21 1046 114/79     Pulse Rate 10/18/21 1046 60     Resp 10/18/21 1046 16     Temp 10/18/21 1046 98.2 F (36.8 C)     Temp Source 10/18/21 1046 Oral      SpO2 10/18/21 1046 100 %     Weight --      Height --      Head Circumference --  Peak Flow --      Pain Score 10/18/21 1043 10     Pain Loc --      Pain Edu? --      Excl. in [place]? --    No data found.  Updated Vital Signs BP 114/79 (BP Location: Left Arm)   Pulse 60   Temp 98.2 F (36.8 C) (Oral)   Resp 16   SpO2 100%   Visual Acuity Right Eye Distance:   Left Eye Distance:   Bilateral Distance:    Right Eye Near:   Left Eye Near:    Bilateral Near:     Physical Exam Constitutional:      General: She is in acute distress.  Cardiovascular:     Rate and Rhythm: Normal rate.  Pulmonary:     Effort: Pulmonary effort is normal.  Abdominal:     General: Abdomen is flat.  Musculoskeletal:        General: Normal range of motion.  Skin:    General: Skin is warm.  Neurological:     General: No focal deficit present.     Mental Status: She is alert. Mental status is at baseline.     Cranial Nerves: Cranial nerves 2-12 are intact.     Sensory: Sensation is intact.     Motor: Motor function is intact.     Coordination: Coordination is intact.     Comments: Lying on bed eyes closed lights are off complaining of light sensitivity      UC Treatments / Results  Labs (all labs ordered are listed, but only abnormal results are displayed) Labs Reviewed - No data to display  EKG   Radiology No results found.  Procedures Procedures (including critical care time)  Medications Ordered in UC Medications  diphenhydrAMINE (BENADRYL) injection 50 mg (50 mg Intramuscular Given 10/18/21 1159)  ketorolac (TORADOL) injection 60 mg (60 mg Intramuscular Given 10/18/21 1157)  promethazine (PHENERGAN) injection 12.5 mg (12.5 mg Intramuscular Given 10/18/21 1156)    Initial Impression / Assessment and Plan / UC Course  I have reviewed the triage vital signs and the nursing notes.  Pertinent labs & imaging results that were available during my care of the patient were  reviewed by me and considered in my medical decision making (see chart for details).     Patient's husband is at bedside to try patient patient is in room with lights off lying on the bed Patient states that she does go to other urgent cares when it is not relieved by at home medication has received injections and does well Patient does understand she will need to follow-up with her neurologist if symptoms persist and become worse she will need to be seen in the emergency room for further testing and IV fluids They hydrated drink plenty of fluids Patient reports to nurse that she is starting to feel better and is ready for discharge Final Clinical Impressions(s) / UC Diagnoses   Final diagnoses:  Chronic migraine with aura without status migrainosus, not intractable  Nausea and vomiting, unspecified vomiting type     Discharge Instructions      If symptoms persist or become worse contact your neurologist or go to the emergency room     ED Prescriptions     Medication Sig Dispense Auth. Provider   predniSONE (STERAPRED UNI-PAK 21 TAB) 10 MG (21) TBPK tablet Take by mouth daily. Take 6 tabs by mouth daily  for 2 days, then 5 tabs for 2 days,  then 4 tabs for 2 days, then 3 tabs for 2 days, 2 tabs for 2 days, then 1 tab by mouth daily for 2 days 42 tablet Marney Setting, NP      PDMP not reviewed this encounter.   Marney Setting, NP 10/18/21 [phone]

## 2021-10-18 NOTE — Telephone Encounter (Signed)
A user error has taken place: encounter opened in error, closed for administrative reasons.

## 2021-10-18 NOTE — Telephone Encounter (Signed)
Pt states she went to [facility]

## 2021-10-18 NOTE — ED Triage Notes (Signed)
Pt presents with a migraine that started yesterday. She has nausea, light sensitivity and visual auras. She has tried her migraine medication with no help.

## 2021-10-18 NOTE — Telephone Encounter (Signed)
Summary: head ache / dizziness   Patient experiencing headache, since yesterday dizziness and feeling nausea, patient has already taking emergency pills  zolmitriptan (ZOMIG) 5 MG tablet prescribed by her neurologist. Patient states PCP advised next time she's experiencing a head ache to call the office and schedule a nurse visit to receive a shot.        Chief Complaint: headache requesting "shot" Symptoms: dizziness and nausea started yesterday. Headache right side of head and right eye pain started this am around 5:30am took zomig without relief. Has not gotten out of bed due to sx.  Frequency: yesterday  Pertinent Negatives: Patient denies weakness on either side of body. No vomiting.  Disposition: [] ED /[x] Urgent Care (no appt availability in office) / [] Appointment(In office/virtual)/ []  [facility]/ [] Home Care/ [] Refused Recommended Disposition /[] [place] [facility]/ []  Follow-up with PCP Additional Notes:   Offered appt today at 11:20 patient declined reports she needs to be seen sooner. Reports she will just need to go to [facility] [place] notified patient declined appt today . Please advise.       Reason for Disposition  [1] SEVERE headache (e.g., excruciating) AND [2] not improved after 2 hours of pain medicine  Answer Assessment - Initial Assessment Questions 1. LOCATION: "Where does it hurt?"      Right side of head and right eye 2. ONSET: "When did the headache start?" (Minutes, hours or days)      This am , nausea and dizziness started yesterday  3. PATTERN: "Does the pain come and go, or has it been constant since it started?"     Constant  4. SEVERITY: "How bad is the pain?" and "What does it keep you from doing?"  (e.g., Scale 1-10; mild, moderate, or severe)   - MILD (1-3): doesn't interfere with normal activities    - MODERATE (4-7): interferes with normal activities or awakens from sleep    - SEVERE (8-10): excruciating pain, unable to do any  normal activities        Moderate to severe 5. RECURRENT SYMPTOM: "Have you ever had headaches before?" If Yes, ask: "When was the last time?" and "What happened that time?"      Yes taken zomig 6. CAUSE: "What do you think is causing the headache?"     na 7. MIGRAINE: "Have you been diagnosed with migraine headaches?" If Yes, ask: "Is this headache similar?"      Yes has taken zomig at 5:30 am this am  8. HEAD INJURY: "Has there been any recent injury to the head?"      na 9. OTHER SYMPTOMS: "Do you have any other symptoms?" (fever, stiff neck, eye pain, sore throat, cold symptoms)     Right side of head and right eye discomfort no blurred vision no weakness on either side of body  10. PREGNANCY: "Is there any chance you are pregnant?" "When was your last menstrual period?"       na  Protocols used: [facility]

## 2021-10-25 ENCOUNTER — Ambulatory Visit: Payer: Self-pay | Admitting: *Deleted

## 2021-10-25 ENCOUNTER — Encounter: Payer: Self-pay | Admitting: Nurse Practitioner

## 2021-10-25 ENCOUNTER — Ambulatory Visit: Payer: 59 | Admitting: Nurse Practitioner

## 2021-10-25 VITALS — BP 94/64 | HR 76 | Temp 97.7°F | Ht 66.5 in | Wt 152.5 lb

## 2021-10-25 DIAGNOSIS — R109 Unspecified abdominal pain: Secondary | ICD-10-CM | POA: Diagnosis not present

## 2021-10-25 DIAGNOSIS — N3010 Interstitial cystitis (chronic) without hematuria: Secondary | ICD-10-CM

## 2021-10-25 LAB — URINALYSIS, ROUTINE W REFLEX MICROSCOPIC
Bilirubin, UA: NEGATIVE
Glucose, UA: NEGATIVE
Ketones, UA: NEGATIVE
Leukocytes,UA: NEGATIVE
Nitrite, UA: NEGATIVE
Protein,UA: NEGATIVE
Specific Gravity, UA: 1.015 (ref 1.005–1.030)
Urobilinogen, Ur: 0.2 mg/dL (ref 0.2–1.0)
pH, UA: 6 (ref 5.0–7.5)

## 2021-10-25 LAB — MICROSCOPIC EXAMINATION: Bacteria, UA: NONE SEEN

## 2021-10-25 MED ORDER — CYCLOBENZAPRINE HCL 5 MG PO TABS: 5.0000 mg | ORAL_TABLET | Freq: Three times a day (TID) | ORAL | 1 refills | Status: DC | PRN

## 2021-10-25 MED ORDER — TAMSULOSIN HCL 0.4 MG PO CAPS: 0.4000 mg | ORAL_CAPSULE | Freq: Every day | ORAL | 3 refills | Status: DC

## 2021-10-25 NOTE — Telephone Encounter (Signed)
  Chief Complaint: Left lower back pain Symptoms: Left flank and back pain 8/10  "Urethra throbbing" Ache,pressure at bladder area . "Feels like I have to pee but can't."  Frequency: This AM, "Just woke up, 30 minutes ago Pertinent Negatives: Patient denies Dysuria, hematuria Disposition: [] ED /[] Urgent Care (no appt availability in office) / [x] Appointment(In office/virtual)/ []  [facility] [facility]/ [] Home Care/ [] Refused Recommended Disposition /[] [facility] [facility]/ []  Follow-up with PCP Additional Notes: States H/O endometriosis. Appt secured for this Am. Care advise provided, ED for worsening symptoms,  pt verbalizes understanding.  Reason for Disposition  [1] SEVERE back pain (e.g., excruciating, unable to do any normal activities) AND [2] not improved 2 hours after pain medicine  Answer Assessment - Initial Assessment Questions 1. ONSET: "When did the pain begin?"      This AM 2. LOCATION: "Where does it hurt?" (upper, mid or lower back)     Left lower side 3. SEVERITY: "How bad is the pain?"  (e.g., Scale 1-10; mild, moderate, or severe)   - MILD (1-3): Doesn't interfere with normal activities.    - MODERATE (4-7): Interferes with normal activities or awakens from sleep.    - SEVERE (8-10): Excruciating pain, unable to do any normal activities.      8/10 4. PATTERN: "Is the pain constant?" (e.g., yes, no; constant, intermittent)      Constant 5. RADIATION: "Does the pain shoot into your legs or somewhere else?"     "Urethra throbbing" 6. CAUSE:  "What do you think is causing the back pain?"      Unsure 7. BACK OVERUSE:  "Any recent lifting of heavy objects, strenuous work or exercise?"     No 8. MEDICINES: "What have you taken so far for the pain?" (e.g., nothing, acetaminophen, NSAIDS)     Nothing 9. NEUROLOGIC SYMPTOMS: "Do you have any weakness, numbness, or problems with bowel/bladder control?"     No 10. OTHER SYMPTOMS: "Do you have any other symptoms?" (e.g.,  fever, abdomen pain, burning with urination, blood in urine)       "Urethra hurts"  Flank pain and left lower back pain  Protocols used: Back Pain-A-AH

## 2021-10-25 NOTE — Patient Instructions (Signed)
Low-Purine Eating Plan A low-purine eating plan involves making food choices to limit your purine intake. Purine is a kind of uric acid. Too much uric acid in your blood can cause certain conditions, such as gout and kidney stones. Eating a low-purine diet may help control these conditions. What are tips for following this plan? Shopping Avoid buying products that contain high-fructose corn syrup. Check for this on food labels. It is commonly found in many processed foods and soft drinks. Be sure to check for it in baked goods such as cookies, canned fruits, and cereals and cereal bars. Avoid buying veal, chicken breast with skin, lamb, and organ meats such as liver. These types of meats tend to have the highest purine content. Choose dairy products. These may lower uric acid levels. Avoid certain types of fish. Not all fish and seafood have high purine content. Examples with high purine content include anchovies, trout, tuna, sardines, and salmon. Avoid buying beverages that contain alcohol, particularly beer and hard liquor. Alcohol can affect the way your body gets rid of uric acid. Meal planning  Learn which foods do or do not affect you. If you find out that a food tends to cause your gout symptoms to flare up, avoid eating that food. You can enjoy foods that do not cause problems. If you have any questions about a food item, talk with your dietitian or health care provider. Reduce the overall amount of meat in your diet. When you do eat meat, choose ones with lower purine content. Include plenty of fruits and vegetables. Although some vegetables may have a high purine content--such as asparagus, mushrooms, spinach, or cauliflower--it has been shown that these do not contribute to uric acid blood levels as much. Consume at least 1 dairy serving a day. This has been shown to decrease uric acid levels. General information If you drink alcohol: Limit how much you have to: 0-1 drink a day for  women who are not pregnant. 0-2 drinks a day for men. Know how much alcohol is in a drink. In the [place] one drink equals one 12 oz bottle of beer (355 mL), one 5 oz glass of wine (148 mL), or one 1 oz glass of hard liquor (44 mL). Drink plenty of water. Try to drink enough to keep your urine pale yellow. Fluids can help remove uric acid from your body. Work with your health care provider and dietitian to develop a plan to achieve or maintain a healthy weight. Losing weight may help reduce uric acid in your blood. What foods are recommended? The following are some types of foods that are good choices when limiting purine intake: Fresh or frozen fruits and vegetables. Whole grains, breads, cereals, and pasta. Rice. Beans, peas, legumes. Nuts and seeds. Dairy products. Fats and oils. The items listed above may not be a complete list. Talk with a dietitian about what dietary choices are best for you. What foods are not recommended? Limit your intake of foods high in purines, including: Beer and other alcohol. Meat-based gravy or sauce. Canned or fresh fish, such as: Anchovies, sardines, herring, salmon, and tuna. Mussels and scallops. Codfish, trout, and haddock. Bacon, veal, chicken breast with skin, and lamb. Organ meats, such as: Liver or kidney. Tripe. Sweetbreads (thymus gland or pancreas). Wild game or goose. Yeast or yeast extract supplements. Drinks sweetened with high-fructose corn syrup, such as soda. Processed foods made with high-fructose corn syrup. The items listed above may not be a complete list of foods   and beverages you should limit. Contact a dietitian for more information. Summary Eating a low-purine diet may help control conditions caused by too much uric acid in the body, such as gout or kidney stones. Choose low-purine foods, limit alcohol, and limit high-fructose corn syrup. You will learn over time which foods do or do not affect you. If you find out that a  food tends to cause your gout symptoms to flare up, avoid eating that food. This information is not intended to replace advice given to you by your health care provider. Make sure you discuss any questions you have with your health care provider. Document Revised: 12/17/2020 Document Reviewed: 12/17/2020 Elsevier Patient Education  2023 Elsevier Inc.  

## 2021-10-25 NOTE — Assessment & Plan Note (Signed)
Refer to flank pain plan of care.

## 2021-10-25 NOTE — Progress Notes (Signed)
Acute Office Visit  Subjective:     Patient ID: Bethany Harrison, female    DOB: 1985/02/01, 36 y.o.   MRN: [id number]  Chief Complaint  Patient presents with   Flank Pain   Abdominal Pain   Urinary Frequency   Nephrolithiasis    Patient says she may have passed kidney stone this morning and says she woke up with symptoms and says she did not think it was a bladder/kidney infection with the symptoms being onset so fast. Patient says took a picture of what she thinks is the stone and says she feels a lot better, no more back pain, but abdomen tenderness and says her urethra is sore.     Started this morning with severe left flank pain, suspects she passed a kidney stone.  History of kidney stone in past, in around 2015.  She believes she passed a stone this morning and after this was passed she felt much better.  There was little sand looking things in urine and then a stone like presence noted in urine.  Over past 3-4 nights was urinating more in middle of the night.  Is currently on Topamax for migraines, she recalls when first had kidney stones she as on this as well.  Flank Pain The quality of the pain is described as aching. The pain does not radiate. The pain is at a severity of 3/10. The pain is mild. The pain is The same all the time. The symptoms are aggravated by position. Stiffness is present All day. Associated symptoms include abdominal pain and dysuria. Pertinent negatives include no bladder incontinence, bowel incontinence, chest pain, fever, headaches, numbness, pelvic pain, weakness or weight loss.   Patient is in today for urinary symptoms present.  Review of Systems  Constitutional:  Negative for fever, malaise/fatigue and weight loss.  Respiratory: Negative.    Cardiovascular:  Negative for chest pain and palpitations.  Gastrointestinal:  Positive for abdominal pain. Negative for bowel incontinence, constipation, diarrhea, nausea and vomiting.  Genitourinary:  Positive for  dysuria, flank pain, frequency and urgency. Negative for bladder incontinence, hematuria and pelvic pain.  Neurological:  Negative for weakness, numbness and headaches.  Psychiatric/Behavioral: Negative.        Objective:    BP 94/64   Pulse 76   Temp 97.7 F (36.5 C) (Oral)   Ht 5' 6.5" (1.689 m)   Wt 152 lb 8 oz (69.2 kg)   SpO2 96%   BMI 24.25 kg/m  BP Readings from Last 3 Encounters:  10/25/21 94/64  10/18/21 114/79  09/14/21 100/67   Wt Readings from Last 3 Encounters:  10/25/21 152 lb 8 oz (69.2 kg)  09/14/21 148 lb 6.4 oz (67.3 kg)  08/05/21 147 lb 6.4 oz (66.9 kg)   Physical Exam Vitals and nursing note reviewed.  Constitutional:      General: She is awake. She is not in acute distress.    Appearance: She is well-developed and well-groomed. She is not ill-appearing or toxic-appearing.  HENT:     Head: Normocephalic.     Right Ear: Hearing normal.     Left Ear: Hearing normal.  Eyes:     General: Lids are normal.        Right eye: No discharge.        Left eye: No discharge.     Conjunctiva/sclera: Conjunctivae normal.     Pupils: Pupils are equal, round, and reactive to light.  Neck:     Vascular: No carotid  bruit.  Cardiovascular:     Rate and Rhythm: Normal rate and regular rhythm.     Heart sounds: Normal heart sounds. No murmur heard.    No gallop.  Pulmonary:     Effort: Pulmonary effort is normal. No accessory muscle usage or respiratory distress.     Breath sounds: Normal breath sounds.  Abdominal:     General: Bowel sounds are normal. There is no distension.     Palpations: Abdomen is soft.     Tenderness: There is abdominal tenderness in the suprapubic area. There is no right CVA tenderness or left CVA tenderness.  Musculoskeletal:     Cervical back: Normal range of motion and neck supple.     Right lower leg: No edema.     Left lower leg: No edema.  Skin:    General: Skin is warm and dry.  Neurological:     Mental Status: She is alert and  oriented to person, place, and time.  Psychiatric:        Attention and Perception: Attention normal.        Mood and Affect: Mood normal.        Speech: Speech normal.        Behavior: Behavior normal. Behavior is cooperative.        Thought Content: Thought content normal.    No results found for any visits on 10/25/21.     Assessment & Plan:   Problem List Items Addressed This Visit       Genitourinary   Interstitial cystitis    Refer to flank pain plan of care.      Relevant Orders   Urinalysis, Routine w reflex microscopic     Other   Acute left flank pain - Primary    Acute and improving at this time, history of kidney stones in 2015 and suspect stone passed this morning.  UA noting 2+ blood, no other findings.  She is on Topamax, ?related to kidney stones.  At this time will send in Flomax to take daily and Flexeril as needed.  CT scan abd/pelvis ordered to assess for further stones.  Educated patient on this and plan of care.        Relevant Orders   CT Abdomen Pelvis Wo Contrast    Meds ordered this encounter  Medications   tamsulosin (FLOMAX) 0.4 MG CAPS capsule    Sig: Take 1 capsule (0.4 mg total) by mouth daily.    Dispense:  30 capsule    Refill:  3   cyclobenzaprine (FLEXERIL) 5 MG tablet    Sig: Take 1 tablet (5 mg total) by mouth 3 (three) times daily as needed for muscle spasms.    Dispense:  30 tablet    Refill:  1    Return for as scheduled in November.  Venita Lick, NP

## 2021-10-25 NOTE — Assessment & Plan Note (Signed)
Acute and improving at this time, history of kidney stones in 2015 and suspect stone passed this morning.  UA noting 2+ blood, no other findings.  She is on Topamax, ?related to kidney stones.  At this time will send in Flomax to take daily and Flexeril as needed.  CT scan abd/pelvis ordered to assess for further stones.  Educated patient on this and plan of care.

## 2021-10-26 ENCOUNTER — Encounter (INDEPENDENT_AMBULATORY_CARE_PROVIDER_SITE_OTHER): Payer: 59 | Admitting: Psychiatry

## 2021-10-26 DIAGNOSIS — G43009 Migraine without aura, not intractable, without status migrainosus: Secondary | ICD-10-CM | POA: Diagnosis not present

## 2021-10-26 MED ORDER — ELETRIPTAN HYDROBROMIDE 40 MG PO TABS: 40.0000 mg | ORAL_TABLET | ORAL | 6 refills | Status: DC | PRN

## 2021-10-26 NOTE — Telephone Encounter (Signed)
Please see the MyChart message reply(ies) for my assessment and plan.    This patient gave consent for this Medical Advice Message and is aware that it may result in a bill to [employer], as well as the possibility of receiving a bill for a co-payment or deductible. They are an established patient, but are not seeking medical advice exclusively about a problem treated during an in person or video visit in the last seven days. I did not recommend an in person or video visit within seven days of my reply.    I spent a total of 11 minutes cumulative time within 7 days through [employer].  Genia Harold, MD  10/26/21 12:36 PM

## 2021-11-03 ENCOUNTER — Ambulatory Visit: Payer: 59

## 2021-11-10 ENCOUNTER — Encounter: Payer: Self-pay | Admitting: Family Medicine

## 2021-11-11 MED ORDER — TRAZODONE HCL 50 MG PO TABS: ORAL_TABLET | ORAL | 1 refills | Status: DC

## 2021-11-11 NOTE — Addendum Note (Signed)
Addended by: Marnee Guarneri T on: 11/11/2021 05:55 PM   Modules accepted: Orders

## 2021-11-23 ENCOUNTER — Encounter: Payer: Self-pay | Admitting: Obstetrics and Gynecology

## 2021-11-23 ENCOUNTER — Ambulatory Visit (INDEPENDENT_AMBULATORY_CARE_PROVIDER_SITE_OTHER): Payer: 59 | Admitting: Obstetrics and Gynecology

## 2021-11-23 VITALS — BP 121/83 | HR 83 | Ht 66.5 in | Wt 149.8 lb

## 2021-11-23 DIAGNOSIS — R61 Generalized hyperhidrosis: Secondary | ICD-10-CM | POA: Diagnosis not present

## 2021-11-23 DIAGNOSIS — R102 Pelvic and perineal pain: Secondary | ICD-10-CM

## 2021-11-23 DIAGNOSIS — N809 Endometriosis, unspecified: Secondary | ICD-10-CM

## 2021-11-23 DIAGNOSIS — Z01818 Encounter for other preprocedural examination: Secondary | ICD-10-CM | POA: Diagnosis not present

## 2021-11-23 MED ORDER — METRONIDAZOLE 500 MG PO TABS: 500.0000 mg | ORAL_TABLET | Freq: Two times a day (BID) | ORAL | 0 refills | Status: DC

## 2021-11-23 NOTE — Progress Notes (Signed)
Patient presents today for a pre-op exam prior to hysterectomy.  She states that she has been having night sweats recently. She states no additional concerns today.

## 2021-11-23 NOTE — Progress Notes (Addendum)
PRE-OPERATIVE HISTORY AND PHYSICAL EXAM  PCP:  Valerie Roys, DO Subjective:   HPI:  Bethany Harrison is a 36 y.o. G0P0.  Patient's last menstrual period was 11/21/2021 (approximate).  She presents today for a pre-op discussion and PE.  She has the following symptoms: History of endometriosis, continued pelvic pain despite IUD use, completion of childbearing.  Right ovarian pain much greater than left with ovulation.  She has previously been seen at [facility] for infertility issues but this is no longer a factor.  Review of Systems:   Constitutional: Denied constitutional symptoms, night sweats, recent illness, fatigue, fever, insomnia and weight loss.  Eyes: Denied eye symptoms, eye pain, photophobia, vision change and visual disturbance.  Ears/Nose/Throat/Neck: Denied ear, nose, throat or neck symptoms, hearing loss, nasal discharge, sinus congestion and sore throat.  Cardiovascular: Denied cardiovascular symptoms, arrhythmia, chest pain/pressure, edema, exercise intolerance, orthopnea and palpitations.  Respiratory: Denied pulmonary symptoms, asthma, pleuritic pain, productive sputum, cough, dyspnea and wheezing.  Gastrointestinal: Denied, gastro-esophageal reflux, melena, nausea and vomiting.  Genitourinary: See HPI for additional information.  Musculoskeletal: Denied musculoskeletal symptoms, stiffness, swelling, muscle weakness and myalgia.  Dermatologic: Denied dermatology symptoms, rash and scar.  Neurologic: Denied neurology symptoms, dizziness, headache, neck pain and syncope.  Psychiatric: Denied psychiatric symptoms, anxiety and depression.  Endocrine: Denied endocrine symptoms including hot flashes and night sweats.   OB History  Gravida Para Term Preterm AB Living  0            SAB IAB Ectopic Multiple Live Births               Past Medical History:  Diagnosis Date   Abdominal pain    Anxiety    Chronic kidney disease    Depression    Endometriosis     GERD (gastroesophageal reflux disease)    Headache    Hemorrhoid    Migraine    Ulcerative colitis ([place])     Past Surgical History:  Procedure Laterality Date   APPENDECTOMY  2013   with laparoscopy @ [facility] 2017   sebaceous cyst removed   BUNIONECTOMY  08/2008   CHROMOPERTUBATION N/A 06/30/2014   Procedure: CHROMOPERTUBATION;  Surgeon: [name], MD;  Location: [facility] ORS;  Service: Gynecology;  Laterality: N/A;   COLONOSCOPY  2014   DIAGNOSTIC LAPAROSCOPY     DILATION AND CURETTAGE OF UTERUS  2013   polyp   HERNIA REPAIR  5732   umbilical   INTRAUTERINE DEVICE INSERTION  06/24/2011   Mirena IUD   IUD REMOVAL N/A 06/30/2014   Procedure: INTRAUTERINE DEVICE (IUD) REMOVAL;  Surgeon: [name], MD;  Location: [facility] ORS;  Service: Gynecology;  Laterality: N/A;   LAPAROSCOPY N/A 06/30/2014   Procedure:  with excision of endoemtriosis and biopsy;  Surgeon: [name], MD;  Location: [facility] ORS;  Service: Gynecology;  Laterality: N/A;   PELVIC LAPAROSCOPY  2009   times 2   TONSILLECTOMY        SOCIAL HISTORY:  Social History   Tobacco Use  Smoking Status Never  Smokeless Tobacco Never   Social History   Substance and Sexual Activity  Alcohol Use Yes   Alcohol/week: 0.0 standard drinks of alcohol   Comment: occas    Social History   Substance and Sexual Activity  Drug Use No    Family History  Problem Relation Age of Onset   Breast cancer Mother  55   Cancer Mother    Diabetes Mother    Diabetes Maternal Grandmother    Brain cancer Maternal Grandfather    Cancer Paternal Grandmother        colon   Diabetes Paternal Grandmother    Heart disease Neg Hx    Ovarian cancer Neg Hx     ALLERGIES:  Sulfa antibiotics  MEDS:   Current Outpatient Medications on File Prior to Visit  Medication Sig Dispense Refill   cyclobenzaprine (FLEXERIL) 5 MG tablet Take 1 tablet (5 mg total) by mouth 3 (three)  times daily as needed for muscle spasms. 30 tablet 1   Galcanezumab-gnlm (EMGALITY) 120 MG/ML SOAJ Inject 120 mg into the skin every 30 (thirty) days. 1.12 mL 6   meclizine (ANTIVERT) 12.5 MG tablet Take 1 tablet (12.5 mg total) by mouth 3 (three) times daily. 90 tablet 5   ondansetron (ZOFRAN-ODT) 4 MG disintegrating tablet Take 4 mg by mouth every 8 (eight) hours as needed.     topiramate (TOPAMAX) 100 MG tablet Take 1 tablet (100 mg total) by mouth daily. 90 tablet 1   traZODone (DESYREL) 50 MG tablet TAKE 1/2 TO 1 TABLET(25 TO 50 MG) BY MOUTH AT BEDTIME AS NEEDED FOR SLEEP 90 tablet 1   Triamcinolone Acetonide (NASACORT ALLERGY 24HR NA) Place into the nose.     venlafaxine XR (EFFEXOR XR) 150 MG 24 hr capsule Take 1 capsule (150 mg total) by mouth daily with breakfast. 90 capsule 1   tamsulosin (FLOMAX) 0.4 MG CAPS capsule Take 1 capsule (0.4 mg total) by mouth daily. 30 capsule 3   No current facility-administered medications on file prior to visit.    Meds ordered this encounter  Medications   metroNIDAZOLE (FLAGYL) 500 MG tablet    Sig: Take 1 tablet (500 mg total) by mouth 2 (two) times daily. Begin 5 days prior to scheduled surgery as directed.    Dispense:  10 tablet    Refill:  0     Physical examination BP 121/83   Pulse 83   Ht 5' 6.5" (1.689 m)   Wt 149 lb 12.8 oz (67.9 kg)   LMP 11/21/2021 (Approximate)   BMI 23.82 kg/m   General NAD, Conversant  HEENT Atraumatic; Op clear with mmm.  Normo-cephalic. Pupils reactive. Anicteric sclerae  Thyroid/Neck Smooth without nodularity or enlargement. Normal ROM.  Neck Supple.  Skin No rashes, lesions or ulceration. Normal palpated skin turgor. No nodularity.  Breasts: No masses or discharge.  Symmetric.  No axillary adenopathy.  Lungs: Clear to auscultation.No rales or wheezes. Normal Respiratory effort, no retractions.  Heart: NSR.  No murmurs or rubs appreciated. No periferal edema  Abdomen: Soft.  Non-tender.  No masses.   No HSM. No hernia  Extremities: Moves all appropriately.  Normal ROM for age. No lymphadenopathy.  Neuro: Oriented to PPT.  Normal mood. Normal affect.     Pelvic:   Vulva: Normal appearance.  No lesions.  Vagina: No lesions or abnormalities noted.  Support: Normal pelvic support.  Urethra No masses tenderness or scarring.  Meatus Normal size without lesions or prolapse.  Cervix: Normal ectropion.  No lesions.  Anus: Normal exam.  No lesions.  Perineum: Normal exam.  No lesions.        Bimanual   Uterus: Normal size.  Non-tender.  Mobile.  AV.  Adnexae: No masses.  Non-tender to palpation.  Cul-de-sac: Negative for abnormality.   Assessment:   G0P0 Patient Active Problem List  Diagnosis Date Noted   Acute left flank pain 10/25/2021   Depression, recurrent ([place]) 03/06/2018   Ulcerative colitis ([place]) 10/05/2017   Migraine without aura and without status migrainosus, not intractable 05/17/2016   Seasonal allergic rhinitis 05/17/2016   Interstitial cystitis 09/04/2014   Endometriosis 06/30/2014    1. Pre-op exam   2. Endometriosis   3. Night sweats   4. Pelvic pain in female      Plan:   Orders: Meds ordered this encounter  Medications   metroNIDAZOLE (FLAGYL) 500 MG tablet    Sig: Take 1 tablet (500 mg total) by mouth 2 (two) times daily. Begin 5 days prior to scheduled surgery as directed.    Dispense:  10 tablet    Refill:  0     1.  LAVH bilateral salpingectomy right oophorectomy   Pre-op discussions regarding Risks and Benefits of her scheduled surgery.  LAVH The procedure of Laparoscopic Assisted Vaginal Hysterectomy was described to the patient in detail.  We reviewed the rationale for Hysterectomy and the patient was again informed of other nonsurgical management possibilities for her condition.  She has considered these other options, and desires a Hysterectomy.  We have reviewed the fact that Hysterectomy is permanent and that following the procedure she  will not be able to become pregnant or bear children.  We have discussed the following risk factors specifically and the patient has also been informed that additional complications not mentioned may develop:  Damage to bowel, bladder, ureters or to other internal organs, bleeding, infection and the risk from anesthesia.  We have discussed the procedure itself in detail and she has an informed understanding of this surgery.  We have also discussed the recovery period in which physical and sexual activity will be restricted for a varying degree of time, often 3 - 6 weeks. The Laparoscopic Portion of Hysterectomy has also been reviewed with the patient.  She understands how the laparoscope facilitates the procedure.  We have discussed the abdominal incisions and punctures that will be used.  We have also reviewed the increased Operating Room time often accompanying LAVH.  The slightly increased risk of complications secondary to abdominal punctures, and use of laparoscopic instrumentation has also been discussed in detail.I have answered all of her questions and I believe the patient has an informed understanding of the procedure of Laparoscopic Assisted Vaginal Hysterectomy. Oophorectomy The option of Oophorectomy has been discussed with the patient.  Detailed risk/benefits have been reviewed.  The risks discussed include, but are not limited to, hemorrhage, infection, damage to ureter or other internal organ, and Ovarian Remnant Syndrome.  The benefits include a significant decrease in the risk of Ovarian Cancer and in benign Ovarian disease.  The risk of Ovarian CA has been estimated at 1 in 27.  This is a relatively small risk.  However, should Ovarian CA develop, it is often found late in the course of the disease.  We have also discussed the role of inheritance in the development of Ovarian disease.  Some women, who have close relatives with Ovarian CA, have a higher than 1 in 70 risk of Ovarian CA.  The  benefits of Estrogen replacement therapy following Oophorectomy has been stressed.  If she is premenopausal, we have discussed the fact that this procedure will make her permanently sterile and that premature menopause will result if no ERT is begun.  I have answered all of her questions, and I believe that she has an adequate and informed understanding  of the risks and benefits of Oophorectomy. Patient has decided she would like her right ovary removed.  At this time she thinks she would like to keep her left  I spent 30 minutes involved in the care of this patient preparing to see the patient by obtaining and reviewing her medical history (including labs, imaging tests and prior procedures), documenting clinical information in the electronic health record (EHR), counseling and coordinating care plans, writing and sending prescriptions, ordering tests or procedures and in direct communicating with the patient and medical staff discussing pertinent items from her history and physical exam.  Finis Bud, M.D. 11/23/2021 12:32 PM

## 2021-11-23 NOTE — H&P (Signed)
PRE-OPERATIVE HISTORY AND PHYSICAL EXAM  PCP:  Valerie Roys, DO Subjective:   HPI:  Bethany Harrison is a 36 y.o. G0P0.  Patient's last menstrual period was 11/21/2021 (approximate).  She presents today for a pre-op discussion and PE.  She has the following symptoms: History of endometriosis, continued pelvic pain despite IUD use, completion of childbearing.  Right ovarian pain much greater than left with ovulation.  Review of Systems:   Constitutional: Denied constitutional symptoms, night sweats, recent illness, fatigue, fever, insomnia and weight loss.  Eyes: Denied eye symptoms, eye pain, photophobia, vision change and visual disturbance.  Ears/Nose/Throat/Neck: Denied ear, nose, throat or neck symptoms, hearing loss, nasal discharge, sinus congestion and sore throat.  Cardiovascular: Denied cardiovascular symptoms, arrhythmia, chest pain/pressure, edema, exercise intolerance, orthopnea and palpitations.  Respiratory: Denied pulmonary symptoms, asthma, pleuritic pain, productive sputum, cough, dyspnea and wheezing.  Gastrointestinal: Denied, gastro-esophageal reflux, melena, nausea and vomiting.  Genitourinary: See HPI for additional information.  Musculoskeletal: Denied musculoskeletal symptoms, stiffness, swelling, muscle weakness and myalgia.  Dermatologic: Denied dermatology symptoms, rash and scar.  Neurologic: Denied neurology symptoms, dizziness, headache, neck pain and syncope.  Psychiatric: Denied psychiatric symptoms, anxiety and depression.  Endocrine: Denied endocrine symptoms including hot flashes and night sweats.   OB History  Gravida Para Term Preterm AB Living  0            SAB IAB Ectopic Multiple Live Births               Past Medical History:  Diagnosis Date   Abdominal pain    Anxiety    Chronic kidney disease    Depression    Endometriosis    GERD (gastroesophageal reflux disease)    Headache    Hemorrhoid    Migraine    Ulcerative colitis  ([place])     Past Surgical History:  Procedure Laterality Date   APPENDECTOMY  2013   with laparoscopy @ [facility] Right 2017   sebaceous cyst removed   BUNIONECTOMY  08/2008   CHROMOPERTUBATION N/A 06/30/2014   Procedure: CHROMOPERTUBATION;  Surgeon: [name], MD;  Location: [facility] ORS;  Service: Gynecology;  Laterality: N/A;   COLONOSCOPY  2014   DIAGNOSTIC LAPAROSCOPY     DILATION AND CURETTAGE OF UTERUS  2013   polyp   HERNIA REPAIR  1657   umbilical   INTRAUTERINE DEVICE INSERTION  06/24/2011   Mirena IUD   IUD REMOVAL N/A 06/30/2014   Procedure: INTRAUTERINE DEVICE (IUD) REMOVAL;  Surgeon: [name], MD;  Location: [facility] ORS;  Service: Gynecology;  Laterality: N/A;   LAPAROSCOPY N/A 06/30/2014   Procedure:  with excision of endoemtriosis and biopsy;  Surgeon: [name], MD;  Location: [facility] ORS;  Service: Gynecology;  Laterality: N/A;   PELVIC LAPAROSCOPY  2009   times 2   TONSILLECTOMY        SOCIAL HISTORY:  Social History   Tobacco Use  Smoking Status Never  Smokeless Tobacco Never   Social History   Substance and Sexual Activity  Alcohol Use Yes   Alcohol/week: 0.0 standard drinks of alcohol   Comment: occas    Social History   Substance and Sexual Activity  Drug Use No    Family History  Problem Relation Age of Onset   Breast cancer Mother 73   Cancer Mother    Diabetes Mother    Diabetes Maternal Grandmother    Brain  cancer Maternal Grandfather    Cancer Paternal Grandmother        colon   Diabetes Paternal Grandmother    Heart disease Neg Hx    Ovarian cancer Neg Hx     ALLERGIES:  Sulfa antibiotics  MEDS:   Current Outpatient Medications on File Prior to Visit  Medication Sig Dispense Refill   cyclobenzaprine (FLEXERIL) 5 MG tablet Take 1 tablet (5 mg total) by mouth 3 (three) times daily as needed for muscle spasms. 30 tablet 1   Galcanezumab-gnlm (EMGALITY) 120 MG/ML SOAJ Inject  120 mg into the skin every 30 (thirty) days. 1.12 mL 6   meclizine (ANTIVERT) 12.5 MG tablet Take 1 tablet (12.5 mg total) by mouth 3 (three) times daily. 90 tablet 5   ondansetron (ZOFRAN-ODT) 4 MG disintegrating tablet Take 4 mg by mouth every 8 (eight) hours as needed.     topiramate (TOPAMAX) 100 MG tablet Take 1 tablet (100 mg total) by mouth daily. 90 tablet 1   traZODone (DESYREL) 50 MG tablet TAKE 1/2 TO 1 TABLET(25 TO 50 MG) BY MOUTH AT BEDTIME AS NEEDED FOR SLEEP 90 tablet 1   Triamcinolone Acetonide (NASACORT ALLERGY 24HR NA) Place into the nose.     venlafaxine XR (EFFEXOR XR) 150 MG 24 hr capsule Take 1 capsule (150 mg total) by mouth daily with breakfast. 90 capsule 1   tamsulosin (FLOMAX) 0.4 MG CAPS capsule Take 1 capsule (0.4 mg total) by mouth daily. 30 capsule 3   No current facility-administered medications on file prior to visit.    Meds ordered this encounter  Medications   metroNIDAZOLE (FLAGYL) 500 MG tablet    Sig: Take 1 tablet (500 mg total) by mouth 2 (two) times daily. Begin 5 days prior to scheduled surgery as directed.    Dispense:  10 tablet    Refill:  0     Physical examination BP 121/83   Pulse 83   Ht 5' 6.5" (1.689 m)   Wt 149 lb 12.8 oz (67.9 kg)   LMP 11/21/2021 (Approximate)   BMI 23.82 kg/m   General NAD, Conversant  HEENT Atraumatic; Op clear with mmm.  Normo-cephalic. Pupils reactive. Anicteric sclerae  Thyroid/Neck Smooth without nodularity or enlargement. Normal ROM.  Neck Supple.  Skin No rashes, lesions or ulceration. Normal palpated skin turgor. No nodularity.  Breasts: No masses or discharge.  Symmetric.  No axillary adenopathy.  Lungs: Clear to auscultation.No rales or wheezes. Normal Respiratory effort, no retractions.  Heart: NSR.  No murmurs or rubs appreciated. No periferal edema  Abdomen: Soft.  Non-tender.  No masses.  No HSM. No hernia  Extremities: Moves all appropriately.  Normal ROM for age. No lymphadenopathy.  Neuro:  Oriented to PPT.  Normal mood. Normal affect.     Pelvic:   Vulva: Normal appearance.  No lesions.  Vagina: No lesions or abnormalities noted.  Support: Normal pelvic support.  Urethra No masses tenderness or scarring.  Meatus Normal size without lesions or prolapse.  Cervix: Normal ectropion.  No lesions.  Anus: Normal exam.  No lesions.  Perineum: Normal exam.  No lesions.        Bimanual   Uterus: Normal size.  Non-tender.  Mobile.  AV.  Adnexae: No masses.  Non-tender to palpation.  Cul-de-sac: Negative for abnormality.   Assessment:   G0P0 Patient Active Problem List   Diagnosis Date Noted   Acute left flank pain 10/25/2021   Depression, recurrent ([place]) 03/06/2018   Ulcerative colitis (  [place]) 10/05/2017   Migraine without aura and without status migrainosus, not intractable 05/17/2016   Seasonal allergic rhinitis 05/17/2016   Interstitial cystitis 09/04/2014   Endometriosis 06/30/2014    1. Pre-op exam   2. Endometriosis   3. Night sweats   4. Pelvic pain in female      Plan:   Orders: Meds ordered this encounter  Medications   metroNIDAZOLE (FLAGYL) 500 MG tablet    Sig: Take 1 tablet (500 mg total) by mouth 2 (two) times daily. Begin 5 days prior to scheduled surgery as directed.    Dispense:  10 tablet    Refill:  0     1.  LAVH bilateral salpingectomy right oophorectomy, removal of IUD  11/23/2021 12:32 PM

## 2021-11-24 LAB — FOLLICLE STIMULATING HORMONE: FSH: 7.9 m[IU]/mL

## 2021-12-14 ENCOUNTER — Encounter: Payer: Self-pay | Admitting: Neurology

## 2021-12-14 ENCOUNTER — Ambulatory Visit (INDEPENDENT_AMBULATORY_CARE_PROVIDER_SITE_OTHER): Payer: 59 | Admitting: Neurology

## 2021-12-14 ENCOUNTER — Ambulatory Visit: Payer: 59 | Admitting: Family Medicine

## 2021-12-14 VITALS — BP 118/73 | HR 83 | Ht 66.6 in | Wt 154.0 lb

## 2021-12-14 DIAGNOSIS — G43009 Migraine without aura, not intractable, without status migrainosus: Secondary | ICD-10-CM

## 2021-12-14 MED ORDER — ELETRIPTAN HYDROBROMIDE 40 MG PO TABS: 40.0000 mg | ORAL_TABLET | ORAL | 5 refills | Status: DC | PRN

## 2021-12-14 MED ORDER — EMGALITY 120 MG/ML ~~LOC~~ SOAJ: 120.0000 mg | SUBCUTANEOUS | 11 refills | Status: DC

## 2021-12-14 MED ORDER — ONDANSETRON 4 MG PO TBDP: 4.0000 mg | ORAL_TABLET | Freq: Three times a day (TID) | ORAL | 3 refills | Status: DC | PRN

## 2021-12-14 MED ORDER — TOPIRAMATE 100 MG PO TABS: 100.0000 mg | ORAL_TABLET | Freq: Every day | ORAL | 1 refills | Status: DC

## 2021-12-14 NOTE — Patient Instructions (Signed)
Try the Relpax for acute headache, use the good rx savings card Try Ubrelvy sample, continue other medications See you back in 6 months

## 2021-12-14 NOTE — Progress Notes (Signed)
Patient: Bethany Harrison Date of Birth: [birth year]  Reason for Visit: Follow up History from: Patient Primary Neurologist: Dr. Billey Gosling  ASSESSMENT AND PLAN 36 y.o. year old female   45.  Chronic migraine headaches with vertigo symptoms -Continue Emgality for migraine prevention -Try Relpax as needed for acute headache treatment, she will use Good Rx, I also gave her # 2 boxes of Ubrelvy sample to try  -Wishes to hold off on MRI of the brain -Having hysterectomy next month  -Other options for rescue: Zomig nasal spray, Zavzpret; she has taken old rx of Fioricet with good benefit, may consider if her spells are few  -I refilled Zofran for nausea, remains on Topamax and Effexor -Encouraged to reach out via MyChart for any medication adjustment, otherwise we will follow-up in 6 months  HISTORY Copied 08/25/21 Dr. Billey Gosling: The patient presents for evaluation of headaches and dizziness. She has had migraines since her early 16s. She has had dizziness for the past 6-12 months, but it has worsened in the past 3 months. Currently she has episodes of dizziness twice per week. Describes this as both a sensation of lightheadedness and like her body is moving. She will feel uncoordinated and trip over her feet. She will have associated fullness and ringing in her ears. Typically she will get a dizzy spell, then develop a migraine. She will have some form of a headache >50% of the time she has dizziness. Currently she has 1-2 headaches per week.   She was started on meclizine which helps but takes several hours to start working. Has tried multiple migraine medications previously. She is not sure if they were helpful but notes she did not have vertigo at the time she was using them.   Headache History: Onset: early 77s Triggers: menstrual cycle Aura: none Location: unilateral Quality/Description: throbbing Associated Symptoms:             Photophobia: yes             Phonophobia: yes             Nausea:  yes Vomiting: no Other symptoms: vertigo Worse with activity?: yes Duration of headaches: several hours   Headache days per month: 8 Headache free days per month: 22   Current Treatment: Abortive Dramamine meclizine   Preventative Topamax 100 mg QHS Effexor 150 mg daily   Prior Therapies                                 Topamax 100 mg QHS Effexor amitriptyline Emgality Ajovy Nurtec 75 mg - lack of efficacy Imitrex - lack of efficacy Maxalt Fioricet Zofran   Update December 14, 2021 SS: This month is her 3rd Emgality, it is helping. Is due next week for injection. Yesterday dizziness, took meclizine with good benefit. Zomig didn't work but nasal spray was back ordered so tried tablets, eletriptan was expensive for 2 pills. Had Fioricet she took today, it went away. Over the last 3 weeks, only 1 spell needed rescue. Wants to hold off on MRI right now.   REVIEW OF SYSTEMS: Out of a complete 14 system review of symptoms, the patient complains only of the following symptoms, and all other reviewed systems are negative.  See HPI  ALLERGIES: Allergies  Allergen Reactions   Sulfa Antibiotics Nausea Only    HOME MEDICATIONS: Outpatient Medications Prior to Visit  Medication Sig Dispense Refill   cyclobenzaprine (FLEXERIL)  5 MG tablet Take 1 tablet (5 mg total) by mouth 3 (three) times daily as needed for muscle spasms. 30 tablet 1   dimenhyDRINATE (DRAMAMINE PO) Take by mouth as needed.     meclizine (ANTIVERT) 12.5 MG tablet Take 1 tablet (12.5 mg total) by mouth 3 (three) times daily. 90 tablet 5   traZODone (DESYREL) 50 MG tablet TAKE 1/2 TO 1 TABLET(25 TO 50 MG) BY MOUTH AT BEDTIME AS NEEDED FOR SLEEP 90 tablet 1   venlafaxine XR (EFFEXOR XR) 150 MG 24 hr capsule Take 1 capsule (150 mg total) by mouth daily with breakfast. 90 capsule 1   Galcanezumab-gnlm (EMGALITY) 120 MG/ML SOAJ Inject 120 mg into the skin every 30 (thirty) days. 1.12 mL 6   topiramate (TOPAMAX) 100 MG  tablet Take 1 tablet (100 mg total) by mouth daily. 90 tablet 1   metroNIDAZOLE (FLAGYL) 500 MG tablet Take 1 tablet (500 mg total) by mouth 2 (two) times daily. Begin 5 days prior to scheduled surgery as directed. (Patient not taking: Reported on 12/14/2021) 10 tablet 0   Triamcinolone Acetonide (NASACORT ALLERGY 24HR NA) Place into the nose. (Patient not taking: Reported on 12/14/2021)     ondansetron (ZOFRAN-ODT) 4 MG disintegrating tablet Take 4 mg by mouth every 8 (eight) hours as needed. (Patient not taking: Reported on 12/14/2021)     tamsulosin (FLOMAX) 0.4 MG CAPS capsule Take 1 capsule (0.4 mg total) by mouth daily. 30 capsule 3   No facility-administered medications prior to visit.    PAST MEDICAL HISTORY: Past Medical History:  Diagnosis Date   Abdominal pain    Anxiety    Chronic kidney disease    Depression    Endometriosis    GERD (gastroesophageal reflux disease)    Headache    Hemorrhoid    Migraine    Ulcerative colitis ([place])     PAST SURGICAL HISTORY: Past Surgical History:  Procedure Laterality Date   APPENDECTOMY  2013   with laparoscopy @ [facility] Right 2017   sebaceous cyst removed   BUNIONECTOMY  08/2008   CHROMOPERTUBATION N/A 06/30/2014   Procedure: CHROMOPERTUBATION;  Surgeon: [name], MD;  Location: [facility] ORS;  Service: Gynecology;  Laterality: N/A;   COLONOSCOPY  2014   DIAGNOSTIC LAPAROSCOPY     DILATION AND CURETTAGE OF UTERUS  2013   polyp   HERNIA REPAIR  1572   umbilical   INTRAUTERINE DEVICE INSERTION  06/24/2011   Mirena IUD   IUD REMOVAL N/A 06/30/2014   Procedure: INTRAUTERINE DEVICE (IUD) REMOVAL;  Surgeon: [name], MD;  Location: [facility] ORS;  Service: Gynecology;  Laterality: N/A;   LAPAROSCOPY N/A 06/30/2014   Procedure:  with excision of endoemtriosis and biopsy;  Surgeon: [name], MD;  Location: [facility] ORS;  Service: Gynecology;  Laterality: N/A;   PELVIC LAPAROSCOPY   2009   times 2   TONSILLECTOMY      FAMILY HISTORY: Family History  Problem Relation Age of Onset   Breast cancer Mother 10   Cancer Mother    Diabetes Mother    Diabetes Maternal Grandmother    Brain cancer Maternal Grandfather    Cancer Paternal Grandmother        colon   Diabetes Paternal Grandmother    Heart disease Neg Hx    Ovarian cancer Neg Hx     SOCIAL HISTORY: Social History   Socioeconomic History   Marital status: Married  Spouse name: Not on file   Number of children: 0   Years of education: Not on file   Highest education level: Bachelor's degree (e.g., BA, AB, BS)  Occupational History   Occupation: HR    Employer: [employer]  Tobacco Use   Smoking status: Never   Smokeless tobacco: Never  Vaping Use   Vaping Use: Never used  Substance and Sexual Activity   Alcohol use: Yes    Alcohol/week: 0.0 standard drinks of alcohol    Comment: occas   Drug use: No   Sexual activity: Yes    Partners: Male    Birth control/protection: I.U.D.  Other Topics Concern   Not on file  Social History Narrative   Lives with husband   Caffeine- soda maybe one a day   Social Determinants of Health   Financial Resource Strain: Not on file  Food Insecurity: Not on file  Transportation Needs: Not on file  Physical Activity: Sufficiently Active (10/05/2017)   Exercise Vital Sign    Days of Exercise per Week: 5 days    Minutes of Exercise per Session: 60 min  Stress: Not on file  Social Connections: Not on file  Intimate Partner Violence: Not on file    PHYSICAL EXAM  Vitals:   12/14/21 1111  BP: 118/73  Pulse: 83  Weight: 154 lb (69.9 kg)  Height: 5' 6.6" (1.692 m)   Body mass index is 24.41 kg/m.  Generalized: Well developed, in no acute distress  Neurological examination  Mentation: Alert oriented to time, place, history taking. Follows all commands speech and language fluent Cranial nerve II-XII: Pupils were equal round reactive to  light. Extraocular movements were full, visual field were full on confrontational test. Facial sensation and strength were normal. Head turning and shoulder shrug  were normal and symmetric. Motor: The motor testing reveals 5 over 5 strength of all 4 extremities. Good symmetric motor tone is noted throughout.  Sensory: Sensory testing is intact to soft touch on all 4 extremities. No evidence of extinction is noted.  Coordination: Cerebellar testing reveals good finger-nose-finger and heel-to-shin bilaterally.  Gait and station: Gait is normal.  Reflexes: Deep tendon reflexes are symmetric and normal bilaterally.   DIAGNOSTIC DATA (LABS, IMAGING, TESTING) - I reviewed patient records, labs, notes, testing and imaging myself where available.  Lab Results  Component Value Date   WBC 9.5 08/02/2021   HGB 13.4 08/02/2021   HCT 40.1 08/02/2021   MCV 98 (H) 08/02/2021   PLT 373 08/02/2021      Component Value Date/Time   NA 141 08/02/2021 1509   K 3.5 08/02/2021 1509   CL 101 08/02/2021 1509   CO2 25 08/02/2021 1509   GLUCOSE 79 08/02/2021 1509   BUN 23 (H) 08/02/2021 1509   CREATININE 0.79 08/02/2021 1509   CALCIUM 9.9 08/02/2021 1509   PROT 6.8 08/02/2021 1509   ALBUMIN 4.5 08/02/2021 1509   AST 18 08/02/2021 1509   ALT 14 08/02/2021 1509   ALKPHOS 53 08/02/2021 1509   BILITOT 0.5 08/02/2021 1509   Lab Results  Component Value Date   CHOL 193 02/17/2021   HDL 47 02/17/2021   LDLCALC 133 (H) 02/17/2021   TRIG 71 02/17/2021   No results found for: "HGBA1C" No results found for: "VITAMINB12" Lab Results  Component Value Date   TSH 1.260 08/02/2021    Butler Denmark, AGNP-C, DNP 12/14/2021, 11:48 AM [facility] [address] [place], [place] [address] [phone])  [phone]   

## 2021-12-15 ENCOUNTER — Ambulatory Visit: Payer: 59 | Admitting: Neurology

## 2021-12-17 ENCOUNTER — Other Ambulatory Visit: Payer: Self-pay

## 2021-12-17 ENCOUNTER — Encounter
Admission: RE | Admit: 2021-12-17 | Discharge: 2021-12-17 | Disposition: A | Discharge status: Home / Self Care | Payer: 59 | Source: Ambulatory Visit | Attending: Obstetrics and Gynecology | Admitting: Obstetrics and Gynecology

## 2021-12-17 VITALS — Ht 66.5 in | Wt 150.0 lb

## 2021-12-17 DIAGNOSIS — Z01818 Encounter for other preprocedural examination: Secondary | ICD-10-CM

## 2021-12-17 HISTORY — DX: Other specified postprocedural states: Z98.890

## 2021-12-17 HISTORY — DX: Nausea with vomiting, unspecified: R11.2

## 2021-12-17 HISTORY — DX: Personal history of urinary calculi: Z87.442

## 2021-12-17 NOTE — Patient Instructions (Signed)
Your procedure is scheduled on: 12/27/21 Report to [place]. To find out your arrival time please call [phone] between 1PM - 3PM on 12/24/21.  Remember: Instructions that are not followed completely may result in serious medical risk, up to and including death, or upon the discretion of your surgeon and anesthesiologist your surgery may need to be rescheduled.     _X__ 1. Do not eat food or drink any liquids after midnight the night before your procedure.                 No gum chewing or hard candies.   __X__2.  On the morning of surgery brush your teeth with toothpaste and water, you                 may rinse your mouth with mouthwash if you wish.  Do not swallow any              toothpaste of mouthwash.     _X__ 3.  No Alcohol for 24 hours before or after surgery.   _X__ 4.  Do Not Smoke or use e-cigarettes For 24 Hours Prior to Your Surgery.                 Do not use any chewable tobacco products for at least 6 hours prior to                 surgery.  ____  5.  Bring all medications with you on the day of surgery if instructed.   __X__  6.  Notify your doctor if there is any change in your medical condition      (cold, fever, infections).     Do not wear jewelry, make-up, hairpins, clips or nail polish. Do not wear lotions, powders, or perfumes. You may wear deodorant Do not shave body hair 48 hours prior to surgery. Men may shave face and neck. Do not bring valuables to the hospital.    [facility] is not responsible for any belongings or valuables.  Contacts, dentures/partials or body piercings may not be worn into surgery. Bring a case for your contacts, glasses or hearing aids, a denture cup will be supplied. Leave your suitcase in the car. After surgery it may be brought to your room. For patients admitted to the hospital, discharge time is determined by your treatment team.   Patients discharged the day of  surgery will not be allowed to drive home.   __X__ Take these medicines the morning of surgery with A SIP OF WATER:    1. venlafaxine XR (EFFEXOR XR) 150 MG 24 hr capsule   2.   3.   4.  5.  6.  ____ Fleet Enema (as directed)   ____ Use CHG Soap/SAGE wipes as directed  ____ Use inhalers on the day of surgery  ____ Stop metformin/Janumet/Farxiga 2 days prior to surgery    ____ Take 1/2 of usual insulin dose the night before surgery. No insulin the morning          of surgery.   ____ Stop Blood Thinners Coumadin/Plavix/Xarelto/Pleta/Pradaxa/Eliquis/Effient/Aspirin  on   Or contact your Surgeon, Cardiologist or Medical Doctor regarding  ability to stop your blood thinners  __X__ Stop Anti-inflammatories 7 days before surgery such as Advil, Ibuprofen, Motrin,  BC or Goodies Powder, Naprosyn, Naproxen, Aleve, Aspirin   You may take Tylenol if needed  __X__ Stop all herbals and supplements,  fish oil or vitamins for 7 days until after surgery.    ____ Bring C-Pap to the hospital.

## 2021-12-26 MED ORDER — GABAPENTIN 300 MG PO CAPS
300.0000 mg | ORAL_CAPSULE | ORAL | Status: AC
  Administered 2021-12-27: 300 mg via ORAL

## 2021-12-26 MED ORDER — LACTATED RINGERS IV SOLN: INTRAVENOUS | Status: DC

## 2021-12-26 MED ORDER — POVIDONE-IODINE 10 % EX SWAB: 2.0000 | Freq: Once | CUTANEOUS | Status: DC

## 2021-12-26 MED ORDER — CEFAZOLIN SODIUM-DEXTROSE 2-4 GM/100ML-% IV SOLN
2.0000 g | INTRAVENOUS | Status: AC
  Administered 2021-12-27: 2 g via INTRAVENOUS

## 2021-12-26 MED ORDER — ACETAMINOPHEN 500 MG PO TABS
1000.0000 mg | ORAL_TABLET | ORAL | Status: AC
  Administered 2021-12-27: 1000 mg via ORAL

## 2021-12-26 MED ORDER — CELECOXIB 200 MG PO CAPS: 400.0000 mg | ORAL_CAPSULE | ORAL | Status: AC

## 2021-12-26 MED ORDER — CHLORHEXIDINE GLUCONATE 0.12 % MT SOLN: 15.0000 mL | Freq: Once | OROMUCOSAL | Status: AC

## 2021-12-26 MED ORDER — ORAL CARE MOUTH RINSE: 15.0000 mL | Freq: Once | OROMUCOSAL | Status: AC

## 2021-12-26 MED ORDER — FAMOTIDINE 20 MG PO TABS
20.0000 mg | ORAL_TABLET | Freq: Once | ORAL | Status: AC
  Administered 2021-12-27: 20 mg via ORAL

## 2021-12-27 ENCOUNTER — Encounter: Payer: Self-pay | Admitting: Obstetrics and Gynecology

## 2021-12-27 ENCOUNTER — Other Ambulatory Visit: Payer: Self-pay

## 2021-12-27 ENCOUNTER — Ambulatory Visit: Payer: 59 | Admitting: Certified Registered"

## 2021-12-27 ENCOUNTER — Encounter
Admission: RE | Disposition: A | Discharge status: Home / Self Care | Payer: Self-pay | Source: Home / Self Care | Attending: Obstetrics and Gynecology

## 2021-12-27 ENCOUNTER — Ambulatory Visit
Admission: RE | Admit: 2021-12-27 | Discharge: 2021-12-27 | Disposition: A | Discharge status: Home / Self Care | Payer: 59 | Attending: Obstetrics and Gynecology | Admitting: Obstetrics and Gynecology

## 2021-12-27 DIAGNOSIS — R519 Headache, unspecified: Secondary | ICD-10-CM | POA: Insufficient documentation

## 2021-12-27 DIAGNOSIS — N809 Endometriosis, unspecified: Secondary | ICD-10-CM

## 2021-12-27 DIAGNOSIS — F418 Other specified anxiety disorders: Secondary | ICD-10-CM | POA: Diagnosis not present

## 2021-12-27 DIAGNOSIS — N8003 Adenomyosis of the uterus: Secondary | ICD-10-CM | POA: Insufficient documentation

## 2021-12-27 DIAGNOSIS — R102 Pelvic and perineal pain: Secondary | ICD-10-CM

## 2021-12-27 DIAGNOSIS — Z01818 Encounter for other preprocedural examination: Secondary | ICD-10-CM

## 2021-12-27 DIAGNOSIS — D252 Subserosal leiomyoma of uterus: Secondary | ICD-10-CM | POA: Insufficient documentation

## 2021-12-27 DIAGNOSIS — R61 Generalized hyperhidrosis: Secondary | ICD-10-CM

## 2021-12-27 HISTORY — PX: IUD REMOVAL: SHX5392

## 2021-12-27 HISTORY — PX: LAPAROSCOPIC VAGINAL HYSTERECTOMY WITH SALPINGECTOMY: SHX6680

## 2021-12-27 LAB — TYPE AND SCREEN
ABO/RH(D): B POS
Antibody Screen: NEGATIVE

## 2021-12-27 LAB — POCT PREGNANCY, URINE: Preg Test, Ur: NEGATIVE

## 2021-12-27 LAB — ABO/RH: ABO/RH(D): B POS

## 2021-12-27 SURGERY — HYSTERECTOMY, VAGINAL, LAPAROSCOPY-ASSISTED, WITH SALPINGECTOMY
Anesthesia: General | Laterality: Right

## 2021-12-27 MED ORDER — CHLORHEXIDINE GLUCONATE 0.12 % MT SOLN
OROMUCOSAL | Status: AC
  Administered 2021-12-27: 15 mL via OROMUCOSAL
  Filled 2021-12-27: qty 15

## 2021-12-27 MED ORDER — MIDAZOLAM HCL 2 MG/2ML IJ SOLN
INTRAMUSCULAR | Status: AC
  Filled 2021-12-27: qty 2

## 2021-12-27 MED ORDER — ACETAMINOPHEN 10 MG/ML IV SOLN: 1000.0000 mg | Freq: Once | INTRAVENOUS | Status: DC | PRN

## 2021-12-27 MED ORDER — VASOPRESSIN 20 UNIT/ML IV SOLN
INTRAVENOUS | Status: DC | PRN
  Administered 2021-12-27: 10 mL via INTRAMUSCULAR

## 2021-12-27 MED ORDER — ROCURONIUM BROMIDE 10 MG/ML (PF) SYRINGE
PREFILLED_SYRINGE | INTRAVENOUS | Status: AC
  Filled 2021-12-27: qty 10

## 2021-12-27 MED ORDER — IBUPROFEN 600 MG PO TABS
600.0000 mg | ORAL_TABLET | Freq: Four times a day (QID) | ORAL | Status: DC
  Administered 2021-12-27: 600 mg via ORAL
  Filled 2021-12-27: qty 1

## 2021-12-27 MED ORDER — BUPIVACAINE HCL 0.5 % IJ SOLN
INTRAMUSCULAR | Status: DC | PRN
  Administered 2021-12-27: 10 mL

## 2021-12-27 MED ORDER — PHENYLEPHRINE HCL-NACL 20-0.9 MG/250ML-% IV SOLN
INTRAVENOUS | Status: AC
  Filled 2021-12-27: qty 250

## 2021-12-27 MED ORDER — DEXAMETHASONE SODIUM PHOSPHATE 10 MG/ML IJ SOLN
INTRAMUSCULAR | Status: DC | PRN
  Administered 2021-12-27: 10 mg via INTRAVENOUS

## 2021-12-27 MED ORDER — LACTATED RINGERS IV SOLN: INTRAVENOUS | Status: DC

## 2021-12-27 MED ORDER — FENTANYL CITRATE (PF) 100 MCG/2ML IJ SOLN
INTRAMUSCULAR | Status: AC
  Filled 2021-12-27: qty 2

## 2021-12-27 MED ORDER — DIPHENHYDRAMINE HCL 50 MG/ML IJ SOLN
INTRAMUSCULAR | Status: DC | PRN
  Administered 2021-12-27: 12.5 mg via INTRAVENOUS

## 2021-12-27 MED ORDER — OXYCODONE HCL 5 MG PO TABS
5.0000 mg | ORAL_TABLET | Freq: Once | ORAL | Status: AC | PRN
  Administered 2021-12-27: 5 mg via ORAL

## 2021-12-27 MED ORDER — SCOPOLAMINE 1 MG/3DAYS TD PT72
MEDICATED_PATCH | TRANSDERMAL | Status: AC
  Filled 2021-12-27: qty 1

## 2021-12-27 MED ORDER — GABAPENTIN 300 MG PO CAPS
ORAL_CAPSULE | ORAL | Status: AC
  Filled 2021-12-27: qty 1

## 2021-12-27 MED ORDER — ONDANSETRON HCL 4 MG/2ML IJ SOLN
INTRAMUSCULAR | Status: DC | PRN
  Administered 2021-12-27: 4 mg via INTRAVENOUS

## 2021-12-27 MED ORDER — CELECOXIB 200 MG PO CAPS
ORAL_CAPSULE | ORAL | Status: AC
  Administered 2021-12-27: 400 mg via ORAL
  Filled 2021-12-27: qty 2

## 2021-12-27 MED ORDER — VASOPRESSIN 20 UNIT/ML IV SOLN
INTRAVENOUS | Status: AC
  Filled 2021-12-27: qty 1

## 2021-12-27 MED ORDER — PROPOFOL 500 MG/50ML IV EMUL
INTRAVENOUS | Status: DC | PRN
  Administered 2021-12-27: 75 ug/kg/min via INTRAVENOUS

## 2021-12-27 MED ORDER — FENTANYL CITRATE (PF) 100 MCG/2ML IJ SOLN
25.0000 ug | INTRAMUSCULAR | Status: DC | PRN
  Administered 2021-12-27: 50 ug via INTRAVENOUS
  Administered 2021-12-27 (×2): 25 ug via INTRAVENOUS

## 2021-12-27 MED ORDER — HYDROCODONE-ACETAMINOPHEN 5-325 MG PO TABS: 1.0000 | ORAL_TABLET | Freq: Four times a day (QID) | ORAL | 0 refills | Status: DC | PRN

## 2021-12-27 MED ORDER — GLYCOPYRROLATE 0.2 MG/ML IJ SOLN
INTRAMUSCULAR | Status: DC | PRN
  Administered 2021-12-27: .2 mg via INTRAVENOUS

## 2021-12-27 MED ORDER — PROPOFOL 10 MG/ML IV BOLUS
INTRAVENOUS | Status: DC | PRN
  Administered 2021-12-27: 140 mg via INTRAVENOUS

## 2021-12-27 MED ORDER — SUGAMMADEX SODIUM 200 MG/2ML IV SOLN
INTRAVENOUS | Status: DC | PRN
  Administered 2021-12-27: 200 mg via INTRAVENOUS

## 2021-12-27 MED ORDER — FAMOTIDINE 20 MG PO TABS
ORAL_TABLET | ORAL | Status: AC
  Filled 2021-12-27: qty 1

## 2021-12-27 MED ORDER — OXYCODONE HCL 5 MG/5ML PO SOLN: 5.0000 mg | Freq: Once | ORAL | Status: AC | PRN

## 2021-12-27 MED ORDER — SCOPOLAMINE 1 MG/3DAYS TD PT72
1.0000 | MEDICATED_PATCH | TRANSDERMAL | Status: DC
  Administered 2021-12-27: 1.5 mg via TRANSDERMAL

## 2021-12-27 MED ORDER — LIDOCAINE HCL (PF) 2 % IJ SOLN
INTRAMUSCULAR | Status: AC
  Filled 2021-12-27: qty 5

## 2021-12-27 MED ORDER — CEFAZOLIN SODIUM-DEXTROSE 2-4 GM/100ML-% IV SOLN
INTRAVENOUS | Status: AC
  Filled 2021-12-27: qty 100

## 2021-12-27 MED ORDER — DEXTROSE IN LACTATED RINGERS 5 % IV SOLN: INTRAVENOUS | Status: DC

## 2021-12-27 MED ORDER — FENTANYL CITRATE (PF) 100 MCG/2ML IJ SOLN
INTRAMUSCULAR | Status: DC | PRN
  Administered 2021-12-27 (×2): 50 ug via INTRAVENOUS

## 2021-12-27 MED ORDER — PROPOFOL 10 MG/ML IV BOLUS
INTRAVENOUS | Status: AC
  Filled 2021-12-27: qty 40

## 2021-12-27 MED ORDER — LIDOCAINE HCL (CARDIAC) PF 100 MG/5ML IV SOSY
PREFILLED_SYRINGE | INTRAVENOUS | Status: DC | PRN
  Administered 2021-12-27: 60 mg via INTRAVENOUS

## 2021-12-27 MED ORDER — OXYCODONE-ACETAMINOPHEN 5-325 MG PO TABS: 1.0000 | ORAL_TABLET | ORAL | Status: DC | PRN

## 2021-12-27 MED ORDER — OXYCODONE HCL 5 MG PO TABS
ORAL_TABLET | ORAL | Status: AC
  Filled 2021-12-27: qty 1

## 2021-12-27 MED ORDER — ROCURONIUM BROMIDE 100 MG/10ML IV SOLN
INTRAVENOUS | Status: DC | PRN
  Administered 2021-12-27: 40 mg via INTRAVENOUS
  Administered 2021-12-27: 15 mg via INTRAVENOUS

## 2021-12-27 MED ORDER — SIMETHICONE 80 MG PO CHEW: 80.0000 mg | CHEWABLE_TABLET | Freq: Four times a day (QID) | ORAL | Status: DC | PRN

## 2021-12-27 MED ORDER — BUPIVACAINE HCL (PF) 0.5 % IJ SOLN
INTRAMUSCULAR | Status: AC
  Filled 2021-12-27: qty 30

## 2021-12-27 MED ORDER — DEXMEDETOMIDINE HCL IN NACL 80 MCG/20ML IV SOLN
INTRAVENOUS | Status: AC
  Filled 2021-12-27: qty 20

## 2021-12-27 MED ORDER — SODIUM CHLORIDE (PF) 0.9 % IJ SOLN
INTRAMUSCULAR | Status: AC
  Filled 2021-12-27: qty 50

## 2021-12-27 MED ORDER — FENTANYL CITRATE (PF) 100 MCG/2ML IJ SOLN
INTRAMUSCULAR | Status: AC
  Administered 2021-12-27: 50 ug via INTRAVENOUS
  Filled 2021-12-27: qty 2

## 2021-12-27 MED ORDER — ACETAMINOPHEN 500 MG PO TABS
ORAL_TABLET | ORAL | Status: AC
  Filled 2021-12-27: qty 2

## 2021-12-27 MED ORDER — MIDAZOLAM HCL 2 MG/2ML IJ SOLN
INTRAMUSCULAR | Status: DC | PRN
  Administered 2021-12-27: 2 mg via INTRAVENOUS

## 2021-12-27 MED ORDER — PROPOFOL 1000 MG/100ML IV EMUL
INTRAVENOUS | Status: AC
  Filled 2021-12-27: qty 100

## 2021-12-27 MED ORDER — ONDANSETRON HCL 4 MG/2ML IJ SOLN: 4.0000 mg | Freq: Once | INTRAMUSCULAR | Status: DC | PRN

## 2021-12-27 SURGICAL SUPPLY — 54 items
ADH LQ OCL WTPRF AMP STRL LF (MISCELLANEOUS) ×2
ADH SKN CLS APL DERMABOND .7 (GAUZE/BANDAGES/DRESSINGS) ×2
ADHESIVE MASTISOL STRL (MISCELLANEOUS) ×2 IMPLANT
APL PRP STRL LF DISP 70% ISPRP (MISCELLANEOUS) ×2
BAG DRN RND TRDRP ANRFLXCHMBR (UROLOGICAL SUPPLIES) ×2
BAG URINE DRAIN 2000ML AR STRL (UROLOGICAL SUPPLIES) ×2 IMPLANT
BLADE SURG 10 STRL SS SAFETY (BLADE) ×2 IMPLANT
BLADE SURG SZ11 CARB STEEL (BLADE) ×2 IMPLANT
CATH FOLEY 2WAY  5CC 16FR (CATHETERS) ×2
CATH FOLEY 2WAY 5CC 16FR (CATHETERS) ×2
CATH URTH 16FR FL 2W BLN LF (CATHETERS) ×2 IMPLANT
CHLORAPREP W/TINT 26 (MISCELLANEOUS) ×2 IMPLANT
DERMABOND ADVANCED .7 DNX12 (GAUZE/BANDAGES/DRESSINGS) ×2 IMPLANT
ELECT REM PT RETURN 9FT ADLT (ELECTROSURGICAL) ×2
ELECTRODE REM PT RTRN 9FT ADLT (ELECTROSURGICAL) ×2 IMPLANT
GAUZE 4X4 16PLY ~~LOC~~+RFID DBL (SPONGE) ×4 IMPLANT
GLOVE PI ORTHO PRO STRL 7.5 (GLOVE) ×2 IMPLANT
GOWN STRL REUS W/ TWL LRG LVL3 (GOWN DISPOSABLE) ×4 IMPLANT
GOWN STRL REUS W/ TWL XL LVL3 (GOWN DISPOSABLE) ×4 IMPLANT
GOWN STRL REUS W/TWL LRG LVL3 (GOWN DISPOSABLE) ×4
GOWN STRL REUS W/TWL XL LVL3 (GOWN DISPOSABLE) ×4
HANDLE YANKAUER SUCT BULB TIP (MISCELLANEOUS) IMPLANT
IRRIGATION STRYKERFLOW (MISCELLANEOUS) IMPLANT
IRRIGATOR STRYKERFLOW (MISCELLANEOUS)
IV LACTATED RINGERS 1000ML (IV SOLUTION) ×2 IMPLANT
KIT PINK PAD W/HEAD ARE REST (MISCELLANEOUS) ×2
KIT PINK PAD W/HEAD ARM REST (MISCELLANEOUS) ×2 IMPLANT
KIT TURNOVER CYSTO (KITS) ×2 IMPLANT
LIGASURE LAP MARYLAND 5MM 37CM (ELECTROSURGICAL) IMPLANT
MANIFOLD NEPTUNE II (INSTRUMENTS) ×2 IMPLANT
NDL SPNL 22GX3.5 QUINCKE BK (NEEDLE) ×2 IMPLANT
NEEDLE SPNL 22GX3.5 QUINCKE BK (NEEDLE) ×2 IMPLANT
PACK BASIN MINOR ARMC (MISCELLANEOUS) ×2 IMPLANT
PACK GYN LAPAROSCOPIC (MISCELLANEOUS) ×2 IMPLANT
PAD OB MATERNITY 4.3X12.25 (PERSONAL CARE ITEMS) ×2 IMPLANT
PAD PREP 24X41 OB/GYN DISP (PERSONAL CARE ITEMS) ×2 IMPLANT
PENCIL SMOKE EVACUATOR (MISCELLANEOUS) ×2 IMPLANT
RETRACTOR PHONTONGUIDE ADAPT (ADAPTER) IMPLANT
SCRUB CHG 4% DYNA-HEX 4OZ (MISCELLANEOUS) ×2 IMPLANT
SLEEVE Z-THREAD 5X100MM (TROCAR) ×4 IMPLANT
SOLUTION ELECTROLUBE (MISCELLANEOUS) IMPLANT
SPONGE T-LAP 18X18 ~~LOC~~+RFID (SPONGE) ×2 IMPLANT
STRIP CLOSURE SKIN 1/2X4 (GAUZE/BANDAGES/DRESSINGS) IMPLANT
SUT VIC AB 0 CT1 27 (SUTURE) ×4
SUT VIC AB 0 CT1 27XCR 8 STRN (SUTURE) ×4 IMPLANT
SUT VIC AB 0 CT1 36 (SUTURE) ×4 IMPLANT
SUT VIC AB 4-0 FS2 27 (SUTURE) ×2 IMPLANT
SYR 10ML LL (SYRINGE) ×2 IMPLANT
SYR CONTROL 10ML LL (SYRINGE) ×2 IMPLANT
TAPE TRANSPORE STRL 2 31045 (GAUZE/BANDAGES/DRESSINGS) ×2 IMPLANT
TRAP FLUID SMOKE EVACUATOR (MISCELLANEOUS) ×2 IMPLANT
TROCAR XCEL NON-BLD 5MMX100MML (ENDOMECHANICALS) ×2 IMPLANT
TUBING EVAC SMOKE HEATED PNEUM (TUBING) ×2 IMPLANT
WATER STERILE IRR 500ML POUR (IV SOLUTION) ×2 IMPLANT

## 2021-12-27 NOTE — Anesthesia Procedure Notes (Signed)
Procedure Name: Intubation Date/Time: 12/27/2021 2:04 PM  Performed by: Patriciann Becht, CRNAPre-anesthesia Checklist: Patient identified, Patient being monitored, Timeout performed, Emergency Drugs available and Suction available Patient Re-evaluated:Patient Re-evaluated prior to induction Oxygen Delivery Method: Circle system utilized Preoxygenation: Pre-oxygenation with 100% oxygen Induction Type: IV induction Ventilation: Mask ventilation without difficulty Laryngoscope Size: Miller and 2 Grade View: Grade I Tube type: Oral Tube size: 6.5 mm Number of attempts: 1 Placement Confirmation: ETT inserted through vocal cords under direct vision, positive ETCO2 and breath sounds checked- equal and bilateral Secured at: 19.5 cm Tube secured with: Tape Dental Injury: Teeth and Oropharynx as per pre-operative assessment

## 2021-12-27 NOTE — Discharge Instructions (Signed)
AMBULATORY SURGERY  ?DISCHARGE INSTRUCTIONS ? ? ?The drugs that you were given will stay in your system until tomorrow so for the next 24 hours you should not: ? ?Drive an automobile ?Make any legal decisions ?Drink any alcoholic beverage ? ? ?You may resume regular meals tomorrow.  Today it is better to start with liquids and gradually work up to solid foods. ? ?You may eat anything you prefer, but it is better to start with liquids, then soup and crackers, and gradually work up to solid foods. ? ? ?Please notify your doctor immediately if you have any unusual bleeding, trouble breathing, redness and pain at the surgery site, drainage, fever, or pain not relieved by medication. ? ? ? ?Additional Instructions: ? ? ? ?Please contact your physician with any problems or Same Day Surgery at [phone], Monday through Friday 6 am to 4 pm, or [place] [facility] [place] Main number at [phone].  ?

## 2021-12-27 NOTE — Progress Notes (Signed)
Up ambulating and voiding with minimal vaginal bleeding. Tolerating po fluids. Voices readiness for discharge to home.

## 2021-12-27 NOTE — Anesthesia Preprocedure Evaluation (Addendum)
Anesthesia Evaluation  Patient identified by MRN, date of birth, ID band Patient awake    Reviewed: Allergy & Precautions, NPO status , Patient's Chart, lab work & pertinent test results  History of Anesthesia Complications (+) PONV and history of anesthetic complications  Airway Mallampati: I   Neck ROM: Full    Dental no notable dental hx.    Pulmonary neg pulmonary ROS   Pulmonary exam normal breath sounds clear to auscultation       Cardiovascular Normal cardiovascular exam Rhythm:Regular Rate:Normal     Neuro/Psych  Headaches PSYCHIATRIC DISORDERS Anxiety Depression    Vertigo     GI/Hepatic ,GERD  ,,Ulcerative colitis   Endo/Other  negative endocrine ROS    Renal/GU Renal disease (nephrolithiasis)     Musculoskeletal   Abdominal   Peds  Hematology negative hematology ROS (+)   Anesthesia Other Findings   Reproductive/Obstetrics                             Anesthesia Physical Anesthesia Plan  ASA: 3  Anesthesia Plan: General   Post-op Pain Management:    Induction: Intravenous  PONV Risk Score and Plan: 4 or greater and Ondansetron, Dexamethasone, Treatment may vary due to age or medical condition, Scopolamine patch - Pre-op and Midazolam  Airway Management Planned: Oral ETT  Additional Equipment:   Intra-op Plan:   Post-operative Plan: Extubation in OR  Informed Consent: I have reviewed the patients History and Physical, chart, labs and discussed the procedure including the risks, benefits and alternatives for the proposed anesthesia with the patient or authorized representative who has indicated his/her understanding and acceptance.     Dental advisory given  Plan Discussed with: CRNA  Anesthesia Plan Comments: (Patient consented for risks of anesthesia including but not limited to:  - adverse reactions to medications - damage to eyes, teeth, lips or other  oral mucosa - nerve damage due to positioning  - sore throat or hoarseness - damage to heart, brain, nerves, lungs, other parts of body or loss of life  Informed patient about role of CRNA in peri- and intra-operative care.  Patient voiced understanding.)       Anesthesia Quick Evaluation

## 2021-12-27 NOTE — Op Note (Signed)
OPERATIVE NOTE 12/27/2021 3:29 PM  PRE-OPERATIVE DIAGNOSIS:  1) Pelvic Pain/Endometriosis/Failed IUD  POST-OPERATIVE DIAGNOSIS:  Same  OPERATION: Procedure(s) (LRB): LAPAROSCOPIC ASSISTED VAGINAL HYSTERECTOMY WITH SALPINGECTOMY (Right) LAPAROSCOPIC OOPHORECTOMY (Right)    SURGEON(S): Surgeon(s) and Role:    Harlin Heys, MD - Primary    * Rubie Maid, MD - Assisting   ANESTHESIA: Choice  ESTIMATED BLOOD LOSS: 74m  OPERATIVE FINDINGS: Normal Pelvis  SPECIMEN:  ID Type Source Tests Collected by Time Destination  1 : uterus with cervix, bilateral tubes, right ovary Tissue PATH Gyn benign resection SURGICAL PATHOLOGY EHarlin Heys MD [id number]    COMPLICATIONS: None  DRAINS: Foley to gravity  DISPOSITION: Stable to recovery room  DESCRIPTION OF PROCEDURE:      The patient was prepped and draped in the dorsolithotomy position and placed under general anesthesia. The bladder was emptied. The cervix was grasped with a multi-toothed tenaculum and a uterine manipulator was placed within the cervical os respecting the position and curvature of the uterus. After changing gloves we proceeded abdominally. A small infraumbilical incision was made and a 5 mm trocar port was placed within the abdominopelvic cavity. The opening pressure was less than 7 mmHg.  Approximately 3 and 1/2 L of carbon dioxide gas was instilled within the abdominal pelvic cavity. The laparoscope was placed and the pelvis and abdomen were carefully inspected. In the usual manner, under direct visualization right and left lower quadrant ports of 5 mm size were placed. Both ureters were identified in the pelvis prior to dissection or clamping and cutting of pedicles. The infundibulopelvic ligaments were carefully identified. The ureters were again identified out of the operative field. The right IP ligament was triply coagulated and divided. Hemostasis of the pedicles was noted. The  fallopian tubes were elevated and the mesenteric side systematically coagulated and divided allowing the tube to be removed at the time of uterine removal. The round ligaments were coagulated and divided and a bladder flap was created. The upper aspect of the broad ligament was clamped coagulated and divided. The uterine arteries were skeletonized, triply coagulated and divided. Careful inspection of all pedicles and the remainder of the pelvis was performed. Hemostasis was noted. The lower quadrant ports were removed, hemostasis of the port sites was noted, and the incisions were closed in subcuticular manner. The laparoscope and trocar sleeve were removed from the infraumbilical incision, hemostasis was noted, and the incision was closed in a subcuticular manner. A long-acting anesthetic was employed in the skin incisions. We then proceeded vaginally. A weighted speculum was placed posteriorly. A multi-toothed tenaculum was used to grasp the cervix and the cervix was injected in a circumferential manner with a dilute Pitressin solution. An incision was made around the cervix and the vaginal mucosa was dissected off of the cervix. The posterior cul-de-sac was identified and entered and the weighted speculum was placed within this. The anterior cul-de-sac was identified and entered and a retractor was placed and used to retract the bladder anteriorly keeping it out of the operative field. The uterosacral ligaments were clamped divided and suture ligated. The cardinal ligaments were clamped divided and suture ligated. The small remaining pedicle was clamped divided and suture ligated bilaterally allowing delivery of the specimen. Angle sutures were placed in the usual manner. A culdoplasty was performed. The peritoneum was identified anteriorly and then incorporating the left upper pedicle left lower pedicle right lower pedicle right upper pedicle and anterior peritoneum a pursestring  suture was placed  exteriorizing all pedicles. Hemostasis of all pedicles was noted at this time. The vaginal mucosa was then closed with a running suture of Vicryl.  The patient went to recovery room in stable condition.   Dr. Marcelline Mates provided exposure, dissection, suctioning, retraction, and general support and assistance during the procedure.   Finis Bud, M.D. 12/27/2021 3:32 PM

## 2021-12-27 NOTE — Transfer of Care (Signed)
Immediate Anesthesia Transfer of Care Note  Patient: CAPRI VEALS  Procedure(s) Performed: LAPAROSCOPIC ASSISTED VAGINAL HYSTERECTOMY WITH SALPINGECTOMY (Right) LAPAROSCOPIC OOPHORECTOMY (Right) INTRAUTERINE DEVICE (IUD) REMOVAL  Patient Location: PACU  Anesthesia Type:General  Level of Consciousness: awake, alert , oriented, and patient cooperative  Airway & Oxygen Therapy: Patient Spontanous Breathing and Patient connected to nasal cannula oxygen  Post-op Assessment: Report given to RN and Post -op Vital signs reviewed and stable  Post vital signs: Reviewed and stable  Last Vitals:  Vitals Value Taken Time  BP 110/60 12/27/21 1545  Temp 36.2 C 12/27/21 1539  Pulse 65 12/27/21 1554  Resp 15 12/27/21 1554  SpO2 97 % 12/27/21 1554  Vitals shown include unvalidated device data.  Last Pain:  Vitals:   12/27/21 1545  TempSrc:   PainSc: 10-Worst pain ever         Complications: No notable events documented.

## 2021-12-27 NOTE — H&P (Signed)
PRE-OPERATIVE HISTORY AND PHYSICAL EXAM   PCP:  Valerie Roys, DO Subjective:    HPI:  Bethany Harrison is a 36 y.o. G0P0.  Patient's last menstrual period was 11/21/2021 (approximate).  She presents today for a pre-op discussion and PE.   She has the following symptoms: History of endometriosis, continued pelvic pain despite IUD use, completion of childbearing.  Right ovarian pain much greater than left with ovulation.   Review of Systems:    Constitutional: Denied constitutional symptoms, night sweats, recent illness, fatigue, fever, insomnia and weight loss.  Eyes: Denied eye symptoms, eye pain, photophobia, vision change and visual disturbance.  Ears/Nose/Throat/Neck: Denied ear, nose, throat or neck symptoms, hearing loss, nasal discharge, sinus congestion and sore throat.  Cardiovascular: Denied cardiovascular symptoms, arrhythmia, chest pain/pressure, edema, exercise intolerance, orthopnea and palpitations.  Respiratory: Denied pulmonary symptoms, asthma, pleuritic pain, productive sputum, cough, dyspnea and wheezing.  Gastrointestinal: Denied, gastro-esophageal reflux, melena, nausea and vomiting.  Genitourinary: See HPI for additional information.  Musculoskeletal: Denied musculoskeletal symptoms, stiffness, swelling, muscle weakness and myalgia.  Dermatologic: Denied dermatology symptoms, rash and scar.  Neurologic: Denied neurology symptoms, dizziness, headache, neck pain and syncope.  Psychiatric: Denied psychiatric symptoms, anxiety and depression.  Endocrine: Denied endocrine symptoms including hot flashes and night sweats.           OB History  Gravida Para Term Preterm AB Living  0            SAB IAB Ectopic Multiple Live Births                       Past Medical History:  Diagnosis Date   Abdominal pain     Anxiety     Chronic kidney disease     Depression     Endometriosis     GERD (gastroesophageal reflux  disease)     Headache     Hemorrhoid     Migraine     Ulcerative colitis ([place])             Past Surgical History:  Procedure Laterality Date   APPENDECTOMY   2013    with laparoscopy @ [place] Right 2017    sebaceous cyst removed   BUNIONECTOMY   08/2008   CHROMOPERTUBATION N/A 06/30/2014    Procedure: CHROMOPERTUBATION;  Surgeon: [name], MD;  Location: [facility] ORS;  Service: Gynecology;  Laterality: N/A;   COLONOSCOPY   2014   DIAGNOSTIC LAPAROSCOPY       DILATION AND CURETTAGE OF UTERUS   2013    polyp   HERNIA REPAIR   7681    umbilical   INTRAUTERINE DEVICE INSERTION   06/24/2011    Mirena IUD   IUD REMOVAL N/A 06/30/2014    Procedure: INTRAUTERINE DEVICE (IUD) REMOVAL;  Surgeon: [name], MD;  Location: [facility] ORS;  Service: Gynecology;  Laterality: N/A;   LAPAROSCOPY N/A 06/30/2014    Procedure:  with excision of endoemtriosis and biopsy;  Surgeon: [name], MD;  Location: [facility] ORS;  Service: Gynecology;  Laterality: N/A;   PELVIC LAPAROSCOPY   2009    times 2   TONSILLECTOMY  SOCIAL HISTORY:   Social History       Tobacco Use  Smoking Status Never  Smokeless Tobacco Never    Social History        Substance and Sexual Activity  Alcohol Use Yes   Alcohol/week: 0.0 standard drinks of alcohol    Comment: occas     Social History       Substance and Sexual Activity  Drug Use No           Family History  Problem Relation Age of Onset   Breast cancer Mother 50   Cancer Mother     Diabetes Mother     Diabetes Maternal Grandmother     Brain cancer Maternal Grandfather     Cancer Paternal Grandmother          colon   Diabetes Paternal Grandmother     Heart disease Neg Hx     Ovarian cancer Neg Hx        ALLERGIES:  Sulfa antibiotics   MEDS:                    Current Outpatient Medications on File Prior to Visit  Medication Sig Dispense Refill   cyclobenzaprine (FLEXERIL)  5 MG tablet Take 1 tablet (5 mg total) by mouth 3 (three) times daily as needed for muscle spasms. 30 tablet 1   Galcanezumab-gnlm (EMGALITY) 120 MG/ML SOAJ Inject 120 mg into the skin every 30 (thirty) days. 1.12 mL 6   meclizine (ANTIVERT) 12.5 MG tablet Take 1 tablet (12.5 mg total) by mouth 3 (three) times daily. 90 tablet 5   ondansetron (ZOFRAN-ODT) 4 MG disintegrating tablet Take 4 mg by mouth every 8 (eight) hours as needed.       topiramate (TOPAMAX) 100 MG tablet Take 1 tablet (100 mg total) by mouth daily. 90 tablet 1   traZODone (DESYREL) 50 MG tablet TAKE 1/2 TO 1 TABLET(25 TO 50 MG) BY MOUTH AT BEDTIME AS NEEDED FOR SLEEP 90 tablet 1   Triamcinolone Acetonide (NASACORT ALLERGY 24HR NA) Place into the nose.       venlafaxine XR (EFFEXOR XR) 150 MG 24 hr capsule Take 1 capsule (150 mg total) by mouth daily with breakfast. 90 capsule 1   tamsulosin (FLOMAX) 0.4 MG CAPS capsule Take 1 capsule (0.4 mg total) by mouth daily. 30 capsule 3    No current facility-administered medications on file prior to visit.          Meds ordered this encounter  Medications   metroNIDAZOLE (FLAGYL) 500 MG tablet      Sig: Take 1 tablet (500 mg total) by mouth 2 (two) times daily. Begin 5 days prior to scheduled surgery as directed.      Dispense:  10 tablet      Refill:  0      Physical examination BP 121/83   Pulse 83   Ht 5' 6.5" (1.689 m)   Wt 149 lb 12.8 oz (67.9 kg)   LMP 11/21/2021 (Approximate)   BMI 23.82 kg/m    General NAD, Conversant  HEENT Atraumatic; Op clear with mmm.  Normo-cephalic. Pupils reactive. Anicteric sclerae  Thyroid/Neck Smooth without nodularity or enlargement. Normal ROM.  Neck Supple.  Skin No rashes, lesions or ulceration. Normal palpated skin turgor. No nodularity.  Breasts: No masses or discharge.  Symmetric.  No axillary adenopathy.  Lungs: Clear to auscultation.No rales or wheezes. Normal Respiratory effort, no retractions.  Heart: NSR.  No murmurs or  rubs appreciated. No periferal edema  Abdomen: Soft.  Non-tender.  No masses.  No HSM. No hernia  Extremities: Moves all appropriately.  Normal ROM for age. No lymphadenopathy.  Neuro: Oriented to PPT.  Normal mood. Normal affect.              Pelvic:    Vulva: Normal appearance.  No lesions.   Vagina: No lesions or abnormalities noted.   Support: Normal pelvic support.   Urethra No masses tenderness or scarring.   Meatus Normal size without lesions or prolapse.   Cervix: Normal ectropion.  No lesions.   Anus: Normal exam.  No lesions.   Perineum: Normal exam.  No lesions.         Bimanual    Uterus: Normal size.  Non-tender.  Mobile.  AV.    Adnexae: No masses.  Non-tender to palpation.    Cul-de-sac: Negative for abnormality.      Assessment:    G0P0     Patient Active Problem List    Diagnosis Date Noted   Acute left flank pain 10/25/2021   Depression, recurrent ([place]) 03/06/2018   Ulcerative colitis ([place]) 10/05/2017   Migraine without aura and without status migrainosus, not intractable 05/17/2016   Seasonal allergic rhinitis 05/17/2016   Interstitial cystitis 09/04/2014   Endometriosis 06/30/2014      1. Pre-op exam   2. Endometriosis   3. Night sweats   4. Pelvic pain in female         Plan:    Orders:     Meds ordered this encounter  Medications   metroNIDAZOLE (FLAGYL) 500 MG tablet      Sig: Take 1 tablet (500 mg total) by mouth 2 (two) times daily. Begin 5 days prior to scheduled surgery as directed.      Dispense:  10 tablet      Refill:  0      1.  LAVH bilateral salpingectomy right oophorectomy, removal of IUD

## 2021-12-28 ENCOUNTER — Ambulatory Visit (INDEPENDENT_AMBULATORY_CARE_PROVIDER_SITE_OTHER): Payer: 59 | Admitting: Obstetrics and Gynecology

## 2021-12-28 ENCOUNTER — Encounter: Payer: 59 | Admitting: Obstetrics and Gynecology

## 2021-12-28 ENCOUNTER — Encounter: Payer: Self-pay | Admitting: Obstetrics and Gynecology

## 2021-12-28 ENCOUNTER — Telehealth: Payer: Self-pay

## 2021-12-28 ENCOUNTER — Ambulatory Visit: Payer: 59

## 2021-12-28 VITALS — BP 134/79 | HR 54 | Wt 148.0 lb

## 2021-12-28 DIAGNOSIS — Z9889 Other specified postprocedural states: Secondary | ICD-10-CM

## 2021-12-28 DIAGNOSIS — R300 Dysuria: Secondary | ICD-10-CM

## 2021-12-28 LAB — POCT URINALYSIS DIPSTICK
Bilirubin, UA: NEGATIVE
Glucose, UA: NEGATIVE
Ketones, UA: NEGATIVE
Nitrite, UA: NEGATIVE
Protein, UA: NEGATIVE
Spec Grav, UA: 1.01 (ref 1.010–1.025)
Urobilinogen, UA: 0.2 E.U./dL
pH, UA: 7 (ref 5.0–8.0)

## 2021-12-28 NOTE — Telephone Encounter (Signed)
Pt calling; had hyst yesterday; is havping pretty severe pain in vagnal area up to belly button; pain med hasn't touched it; Also, is have urinary freq; got up 10-15 times last night b/c she felt like she had to go but on ly a little bit comes out; has to strain to get any to come out.  [phone]

## 2021-12-28 NOTE — Progress Notes (Signed)
HPI:      [name] is a 36 y.o. G0P0 who LMP was Patient's last menstrual period was 12/15/2021 (exact date).  Subjective:   She presents today stating that she has had trouble voiding all through the night.  She reports significant abdominal pain.  She says her pain medication is not working for this type of pain.  She was able to void very small amounts through the night but never empty her bladder. Immediately prior to her visit today she was able to void a little bit better but still feels very uncomfortable. She is not reporting any vaginal bleeding.  She does have some pain of her abdomen at the incision sites.    Hx: The following portions of the patient's history were reviewed and updated as appropriate:             She  has a past medical history of Abdominal pain, Anxiety, Depression, Endometriosis, GERD (gastroesophageal reflux disease), Headache, Hemorrhoid, History of kidney stones, Migraine, PONV (postoperative nausea and vomiting), and Ulcerative colitis ([place]). She does not have any pertinent problems on file. She  has a past surgical history that includes Bunionectomy (08/2008); Tonsillectomy; Dilation and curettage of uterus (2013); Appendectomy (2013); Pelvic laparoscopy (2009); Colonoscopy (2014); Intrauterine device insertion (06/24/2011); Diagnostic laparoscopy; Hernia repair (1989); Chromopertubation (N/A, 06/30/2014); laparoscopy (N/A, 06/30/2014); IUD removal (N/A, 06/30/2014); and Breast cyst excision (Right, 2017). Her family history includes Brain cancer in her maternal grandfather; Breast cancer (age of onset: 25) in her mother; Cancer in her mother and paternal grandmother; Diabetes in her maternal grandmother, mother, and paternal grandmother. She  reports that she has never smoked. She has never used smokeless tobacco. She reports current alcohol use. She reports that she does not use drugs. She has a current medication list which includes the following  prescription(s): cyclobenzaprine, dimenhydrinate, eletriptan, emgality, hydrocodone-acetaminophen, meclizine, ondansetron, topiramate, trazodone, triamcinolone acetonide, and venlafaxine xr. She is allergic to sulfa antibiotics.       Review of Systems:  Review of Systems  Constitutional: Denied constitutional symptoms, night sweats, recent illness, fatigue, fever, insomnia and weight loss.  Eyes: Denied eye symptoms, eye pain, photophobia, vision change and visual disturbance.  Ears/Nose/Throat/Neck: Denied ear, nose, throat or neck symptoms, hearing loss, nasal discharge, sinus congestion and sore throat.  Cardiovascular: Denied cardiovascular symptoms, arrhythmia, chest pain/pressure, edema, exercise intolerance, orthopnea and palpitations.  Respiratory: Denied pulmonary symptoms, asthma, pleuritic pain, productive sputum, cough, dyspnea and wheezing.  Gastrointestinal: Denied, gastro-esophageal reflux, melena, nausea and vomiting.  Genitourinary: See HPI for additional information.  Musculoskeletal: Denied musculoskeletal symptoms, stiffness, swelling, muscle weakness and myalgia.  Dermatologic: Denied dermatology symptoms, rash and scar.  Neurologic: Denied neurology symptoms, dizziness, headache, neck pain and syncope.  Psychiatric: Denied psychiatric symptoms, anxiety and depression.  Endocrine: Denied endocrine symptoms including hot flashes and night sweats.   Meds:   Current Outpatient Medications on File Prior to Visit  Medication Sig Dispense Refill   cyclobenzaprine (FLEXERIL) 5 MG tablet Take 1 tablet (5 mg total) by mouth 3 (three) times daily as needed for muscle spasms. 30 tablet 1   dimenhyDRINATE (DRAMAMINE PO) Take by mouth as needed.     eletriptan (RELPAX) 40 MG tablet Take 1 tablet (40 mg total) by mouth as needed for migraine or headache. May repeat in 2 hours if headache persists or recurs. 10 tablet 5   Galcanezumab-gnlm (EMGALITY) 120 MG/ML SOAJ Inject 120 mg into  the skin every 30 (thirty) days. 1.12 mL 11  HYDROcodone-acetaminophen (NORCO/VICODIN) 5-325 MG tablet Take 1-2 tablets by mouth every 6 (six) hours as needed for moderate pain. 15 tablet 0   meclizine (ANTIVERT) 12.5 MG tablet Take 1 tablet (12.5 mg total) by mouth 3 (three) times daily. (Patient taking differently: Take 12.5 mg by mouth 3 (three) times daily as needed.) 90 tablet 5   ondansetron (ZOFRAN-ODT) 4 MG disintegrating tablet Take 1 tablet (4 mg total) by mouth every 8 (eight) hours as needed. 20 tablet 3   topiramate (TOPAMAX) 100 MG tablet Take 1 tablet (100 mg total) by mouth daily. 90 tablet 1   traZODone (DESYREL) 50 MG tablet TAKE 1/2 TO 1 TABLET(25 TO 50 MG) BY MOUTH AT BEDTIME AS NEEDED FOR SLEEP 90 tablet 1   Triamcinolone Acetonide (NASACORT ALLERGY 24HR NA) Place into the nose.     venlafaxine XR (EFFEXOR XR) 150 MG 24 hr capsule Take 1 capsule (150 mg total) by mouth daily with breakfast. 90 capsule 1   No current facility-administered medications on file prior to visit.      Objective:     Vitals:   12/28/21 1041  BP: 134/79  Pulse: (!) 54   Filed Weights   12/28/21 1041  Weight: 148 lb (67.1 kg)              Foley catheter placed-approximately 180 out.  This after voiding prior to her visit.          Assessment:    G0P0 Patient Active Problem List   Diagnosis Date Noted   Acute left flank pain 10/25/2021   Depression, recurrent ([place]) 03/06/2018   Ulcerative colitis ([place]) 10/05/2017   Migraine without aura and without status migrainosus, not intractable 05/17/2016   Seasonal allergic rhinitis 05/17/2016   Interstitial cystitis 09/04/2014   Endometriosis 06/30/2014     1. Postoperative state   2. Dysuria     Probable urinary retention causing most of her pain.   Plan:            1.  Maintain Foley catheter at home for the next 2 days.  Plan removal on Friday. Orders Placed This Encounter  Procedures   POCT Urinalysis Dipstick    No  orders of the defined types were placed in this encounter.     F/U  Return in about 3 days (around 12/31/2021).  Finis Bud, M.D. 12/28/2021 11:40 AM

## 2021-12-28 NOTE — Progress Notes (Signed)
Patient presents for 1 day postop follow-up following hysterectomy. She states sharp pain in her lower belly that intensifies with urination. Over the night she states waking up frequently and unable to fully void.

## 2021-12-28 NOTE — Telephone Encounter (Signed)
[place] office advised to work patient in today per Dr. Amalia Hailey.

## 2021-12-29 ENCOUNTER — Encounter: Payer: Self-pay | Admitting: Obstetrics and Gynecology

## 2021-12-29 LAB — SURGICAL PATHOLOGY

## 2021-12-31 ENCOUNTER — Ambulatory Visit (INDEPENDENT_AMBULATORY_CARE_PROVIDER_SITE_OTHER): Payer: 59 | Admitting: Obstetrics and Gynecology

## 2021-12-31 ENCOUNTER — Encounter: Payer: Self-pay | Admitting: Obstetrics and Gynecology

## 2021-12-31 VITALS — BP 112/83 | HR 102 | Ht 66.0 in | Wt 150.0 lb

## 2021-12-31 DIAGNOSIS — R339 Retention of urine, unspecified: Secondary | ICD-10-CM

## 2021-12-31 DIAGNOSIS — Z9889 Other specified postprocedural states: Secondary | ICD-10-CM

## 2021-12-31 MED ORDER — NITROFURANTOIN MONOHYD MACRO 100 MG PO CAPS: 100.0000 mg | ORAL_CAPSULE | Freq: Two times a day (BID) | ORAL | 0 refills | Status: DC

## 2021-12-31 NOTE — Progress Notes (Signed)
Patient presents today for post-op catheter follow-up. She states feeling a sense of burning and urgency yesterday. Patient also states not having a BM since surgery, has been taking stool softeners but has not taken a laxative at this time. No additional concerns.

## 2021-12-31 NOTE — Progress Notes (Signed)
HPI:      Bethany Harrison is a 36 y.o. G0P0 who LMP was Patient's last menstrual period was 12/15/2021 (exact date).  Subjective:   She presents today she has been using her Foley catheter at home.  She reports that ever since it was placed her pain got significantly better.  She presents today with a clamped. She does report that she has not had a bowel movement yet but she is "afraid to have a bowel movement with the catheter in place".    Hx: The following portions of the patient's history were reviewed and updated as appropriate:             She  has a past medical history of Abdominal pain, Anxiety, Depression, Endometriosis, GERD (gastroesophageal reflux disease), Headache, Hemorrhoid, History of kidney stones, Migraine, PONV (postoperative nausea and vomiting), and Ulcerative colitis ([place]). She does not have any pertinent problems on file. She  has a past surgical history that includes Bunionectomy (08/2008); Tonsillectomy; Dilation and curettage of uterus (2013); Appendectomy (2013); Pelvic laparoscopy (2009); Colonoscopy (2014); Intrauterine device insertion (06/24/2011); Diagnostic laparoscopy; Hernia repair (1989); Chromopertubation (N/A, 06/30/2014); laparoscopy (N/A, 06/30/2014); IUD removal (N/A, 06/30/2014); Breast cyst excision (Right, 2017); Laparoscopic vaginal hysterectomy with salpingectomy (Right, 12/27/2021); and IUD removal (12/27/2021). Her family history includes Brain cancer in her maternal grandfather; Breast cancer (age of onset: 74) in her mother; Cancer in her mother and paternal grandmother; Diabetes in her maternal grandmother, mother, and paternal grandmother. She  reports that she has never smoked. She has never used smokeless tobacco. She reports current alcohol use. She reports that she does not use drugs. She has a current medication list which includes the following prescription(s): cyclobenzaprine, dimenhydrinate, eletriptan, emgality, hydrocodone-acetaminophen,  meclizine, nitrofurantoin (macrocrystal-monohydrate), ondansetron, topiramate, trazodone, triamcinolone acetonide, and venlafaxine xr. She is allergic to sulfa antibiotics.       Review of Systems:  Review of Systems  Constitutional: Denied constitutional symptoms, night sweats, recent illness, fatigue, fever, insomnia and weight loss.  Eyes: Denied eye symptoms, eye pain, photophobia, vision change and visual disturbance.  Ears/Nose/Throat/Neck: Denied ear, nose, throat or neck symptoms, hearing loss, nasal discharge, sinus congestion and sore throat.  Cardiovascular: Denied cardiovascular symptoms, arrhythmia, chest pain/pressure, edema, exercise intolerance, orthopnea and palpitations.  Respiratory: Denied pulmonary symptoms, asthma, pleuritic pain, productive sputum, cough, dyspnea and wheezing.  Gastrointestinal: Denied, gastro-esophageal reflux, melena, nausea and vomiting.  Genitourinary: See HPI for additional information.  Musculoskeletal: Denied musculoskeletal symptoms, stiffness, swelling, muscle weakness and myalgia.  Dermatologic: Denied dermatology symptoms, rash and scar.  Neurologic: Denied neurology symptoms, dizziness, headache, neck pain and syncope.  Psychiatric: Denied psychiatric symptoms, anxiety and depression.  Endocrine: Denied endocrine symptoms including hot flashes and night sweats.   Meds:   Current Outpatient Medications on File Prior to Visit  Medication Sig Dispense Refill   cyclobenzaprine (FLEXERIL) 5 MG tablet Take 1 tablet (5 mg total) by mouth 3 (three) times daily as needed for muscle spasms. 30 tablet 1   dimenhyDRINATE (DRAMAMINE PO) Take by mouth as needed.     eletriptan (RELPAX) 40 MG tablet Take 1 tablet (40 mg total) by mouth as needed for migraine or headache. May repeat in 2 hours if headache persists or recurs. 10 tablet 5   Galcanezumab-gnlm (EMGALITY) 120 MG/ML SOAJ Inject 120 mg into the skin every 30 (thirty) days. 1.12 mL 11    HYDROcodone-acetaminophen (NORCO/VICODIN) 5-325 MG tablet Take 1-2 tablets by mouth every 6 (six) hours as needed for moderate  pain. 15 tablet 0   meclizine (ANTIVERT) 12.5 MG tablet Take 1 tablet (12.5 mg total) by mouth 3 (three) times daily. (Patient taking differently: Take 12.5 mg by mouth 3 (three) times daily as needed.) 90 tablet 5   ondansetron (ZOFRAN-ODT) 4 MG disintegrating tablet Take 1 tablet (4 mg total) by mouth every 8 (eight) hours as needed. 20 tablet 3   topiramate (TOPAMAX) 100 MG tablet Take 1 tablet (100 mg total) by mouth daily. 90 tablet 1   traZODone (DESYREL) 50 MG tablet TAKE 1/2 TO 1 TABLET(25 TO 50 MG) BY MOUTH AT BEDTIME AS NEEDED FOR SLEEP 90 tablet 1   Triamcinolone Acetonide (NASACORT ALLERGY 24HR NA) Place into the nose.     venlafaxine XR (EFFEXOR XR) 150 MG 24 hr capsule Take 1 capsule (150 mg total) by mouth daily with breakfast. 90 capsule 1   No current facility-administered medications on file prior to visit.      Objective:     Vitals:   12/31/21 0956  BP: 112/83  Pulse: (!) 102   Filed Weights   12/31/21 0956  Weight: 150 lb (68 kg)              Foley catheter removed.  Patient voided greater than 150 cc, catheter residual less than 10 cc.         Abdomen: Soft.  Non-tender.  No masses.  No HSM.  Incision/s: Intact.  Healing well.  No erythema.  No drainage.     Assessment:    G0P0 Patient Active Problem List   Diagnosis Date Noted   Acute left flank pain 10/25/2021   Depression, recurrent ([place]) 03/06/2018   Ulcerative colitis ([place]) 10/05/2017   Migraine without aura and without status migrainosus, not intractable 05/17/2016   Seasonal allergic rhinitis 05/17/2016   Interstitial cystitis 09/04/2014   Endometriosis 06/30/2014     1. Postoperative state     Patient now voiding well without catheter.  Otherwise doing well postop   Plan:            1.  Patient to void every 2 hours.  2.  Macrobid for possible  UTI. Orders No orders of the defined types were placed in this encounter.    Meds ordered this encounter  Medications   nitrofurantoin, macrocrystal-monohydrate, (MACROBID) 100 MG capsule    Sig: Take 1 capsule (100 mg total) by mouth 2 (two) times daily.    Dispense:  14 capsule    Refill:  0      F/U  Return in about 5 weeks (around 02/04/2022).  Finis Bud, M.D. 12/31/2021 11:32 AM

## 2022-01-03 NOTE — Anesthesia Postprocedure Evaluation (Signed)
Anesthesia Post Note  Patient: Bethany Harrison  Procedure(s) Performed: LAPAROSCOPIC ASSISTED VAGINAL HYSTERECTOMY WITH SALPINGECTOMY (Right) LAPAROSCOPIC OOPHORECTOMY (Right) INTRAUTERINE DEVICE (IUD) REMOVAL  Patient location during evaluation: PACU Anesthesia Type: General Level of consciousness: awake and alert Pain management: pain level controlled Vital Signs Assessment: post-procedure vital signs reviewed and stable Respiratory status: spontaneous breathing, nonlabored ventilation, respiratory function stable and patient connected to nasal cannula oxygen Cardiovascular status: blood pressure returned to baseline and stable Postop Assessment: no apparent nausea or vomiting Anesthetic complications: no   No notable events documented.   Last Vitals:  Vitals:   12/27/21 1703 12/27/21 1845  BP: 109/67 109/67  Pulse: 69 (!) 52  Resp: 15 14  Temp: 37.2 C   SpO2: 98% 98%    Last Pain:  Vitals:   12/27/21 1845  TempSrc:   PainSc: 4                  Martha Clan

## 2022-01-04 ENCOUNTER — Encounter: Payer: 59 | Admitting: Obstetrics and Gynecology

## 2022-01-16 ENCOUNTER — Other Ambulatory Visit: Payer: Self-pay | Admitting: Family Medicine

## 2022-01-18 NOTE — Telephone Encounter (Signed)
Requested Prescriptions  Pending Prescriptions Disp Refills   venlafaxine XR (EFFEXOR-XR) 150 MG 24 hr capsule [Pharmacy Med Name: VENLAFAXINE ER '150MG'$  CAPSULES] 90 capsule 0    Sig: TAKE 1 CAPSULE(150 MG) BY MOUTH DAILY WITH BREAKFAST     Psychiatry: Antidepressants - SNRI - desvenlafaxine & venlafaxine Failed - 01/16/2022 10:33 AM      Failed - Lipid Panel in normal range within the last 12 months    Cholesterol, Total  Date Value Ref Range Status  02/17/2021 193 100 - 199 mg/dL Final   LDL Chol Calc (NIH)  Date Value Ref Range Status  02/17/2021 133 (H) 0 - 99 mg/dL Final   HDL  Date Value Ref Range Status  02/17/2021 47 >39 mg/dL Final   Triglycerides  Date Value Ref Range Status  02/17/2021 71 0 - 149 mg/dL Final         Passed - Cr in normal range and within 360 days    Creatinine, Ser  Date Value Ref Range Status  08/02/2021 0.79 0.57 - 1.00 mg/dL Final         Passed - Completed PHQ-2 or PHQ-9 in the last 360 days      Passed - Last BP in normal range    BP Readings from Last 1 Encounters:  12/31/21 112/83         Passed - Valid encounter within last 6 months    Recent Outpatient Visits           2 months ago Acute left flank pain   [facility] [place], [place] T, NP   5 months ago Dizziness   [place], [place], DO   8 months ago Depression, recurrent ([place])   [place], Megan P, DO   10 months ago Depression, recurrent ([place])   [place], Megan P, DO   10 months ago Acute recurrent maxillary sinusitis   [facility] [place], [place], DO

## 2022-01-19 ENCOUNTER — Encounter: Payer: Self-pay | Admitting: Obstetrics and Gynecology

## 2022-02-02 ENCOUNTER — Encounter: Payer: Self-pay | Admitting: Obstetrics and Gynecology

## 2022-02-02 ENCOUNTER — Ambulatory Visit (INDEPENDENT_AMBULATORY_CARE_PROVIDER_SITE_OTHER): Payer: 59 | Admitting: Obstetrics and Gynecology

## 2022-02-02 VITALS — BP 122/81 | HR 92 | Ht 66.0 in | Wt 153.9 lb

## 2022-02-02 DIAGNOSIS — Z9889 Other specified postprocedural states: Secondary | ICD-10-CM

## 2022-02-02 DIAGNOSIS — Z4889 Encounter for other specified surgical aftercare: Secondary | ICD-10-CM

## 2022-02-02 DIAGNOSIS — R102 Pelvic and perineal pain: Secondary | ICD-10-CM

## 2022-02-02 NOTE — Progress Notes (Signed)
HPI:      Bethany Harrison is a 37 y.o. G0P0 who LMP was Patient's last menstrual period was 12/15/2021 (exact date).  Subjective:   She presents today approximately 5 weeks from [facility] RSO for persistent cramping and bleeding.  She was found to have adenomyosis at ultrasound.  She has a history of endometriosis. She reports she is doing well.  She does have occasional back pain and some right-sided discomfort but these are not disabling.  She reports no issues with her incisions. She has not yet resumed intercourse or heavy lifting. She reports no vaginal bleeding. She does report some constipation issues but says that she has always had these.    Hx: The following portions of the patient's history were reviewed and updated as appropriate:             She  has a past medical history of Abdominal pain, Anxiety, Depression, Endometriosis, GERD (gastroesophageal reflux disease), Headache, Hemorrhoid, History of kidney stones, Migraine, PONV (postoperative nausea and vomiting), and Ulcerative colitis ([place]). She does not have any pertinent problems on file. She  has a past surgical history that includes Bunionectomy (08/2008); Tonsillectomy; Dilation and curettage of uterus (2013); Appendectomy (2013); Pelvic laparoscopy (2009); Colonoscopy (2014); Intrauterine device insertion (06/24/2011); Diagnostic laparoscopy; Hernia repair (1989); Chromopertubation (N/A, 06/30/2014); laparoscopy (N/A, 06/30/2014); IUD removal (N/A, 06/30/2014); Breast cyst excision (Right, 2017); Laparoscopic vaginal hysterectomy with salpingectomy (Right, 12/27/2021); and IUD removal (12/27/2021). Her family history includes Brain cancer in her maternal grandfather; Breast cancer (age of onset: 34) in her mother; Cancer in her mother and paternal grandmother; Diabetes in her maternal grandmother, mother, and paternal grandmother. She  reports that she has never smoked. She has never used smokeless tobacco. She reports current  alcohol use. She reports that she does not use drugs. She has a current medication list which includes the following prescription(s): cyclobenzaprine, eletriptan, emgality, meclizine, ondansetron, topiramate, trazodone, triamcinolone acetonide, and venlafaxine xr. She is allergic to sulfa antibiotics.       Review of Systems:  Review of Systems  Constitutional: Denied constitutional symptoms, night sweats, recent illness, fatigue, fever, insomnia and weight loss.  Eyes: Denied eye symptoms, eye pain, photophobia, vision change and visual disturbance.  Ears/Nose/Throat/Neck: Denied ear, nose, throat or neck symptoms, hearing loss, nasal discharge, sinus congestion and sore throat.  Cardiovascular: Denied cardiovascular symptoms, arrhythmia, chest pain/pressure, edema, exercise intolerance, orthopnea and palpitations.  Respiratory: Denied pulmonary symptoms, asthma, pleuritic pain, productive sputum, cough, dyspnea and wheezing.  Gastrointestinal: Denied, gastro-esophageal reflux, melena, nausea and vomiting.  Genitourinary: See HPI for additional information.  Musculoskeletal: Denied musculoskeletal symptoms, stiffness, swelling, muscle weakness and myalgia.  Dermatologic: Denied dermatology symptoms, rash and scar.  Neurologic: Denied neurology symptoms, dizziness, headache, neck pain and syncope.  Psychiatric: Denied psychiatric symptoms, anxiety and depression.  Endocrine: Denied endocrine symptoms including hot flashes and night sweats.   Meds:   Current Outpatient Medications on File Prior to Visit  Medication Sig Dispense Refill   cyclobenzaprine (FLEXERIL) 5 MG tablet Take 1 tablet (5 mg total) by mouth 3 (three) times daily as needed for muscle spasms. 30 tablet 1   eletriptan (RELPAX) 40 MG tablet Take 1 tablet (40 mg total) by mouth as needed for migraine or headache. May repeat in 2 hours if headache persists or recurs. 10 tablet 5   Galcanezumab-gnlm (EMGALITY) 120 MG/ML SOAJ  Inject 120 mg into the skin every 30 (thirty) days. 1.12 mL 11   meclizine (ANTIVERT) 12.5 MG tablet  Take 1 tablet (12.5 mg total) by mouth 3 (three) times daily. (Patient taking differently: Take 12.5 mg by mouth 3 (three) times daily as needed.) 90 tablet 5   ondansetron (ZOFRAN-ODT) 4 MG disintegrating tablet Take 1 tablet (4 mg total) by mouth every 8 (eight) hours as needed. 20 tablet 3   topiramate (TOPAMAX) 100 MG tablet Take 1 tablet (100 mg total) by mouth daily. 90 tablet 1   traZODone (DESYREL) 50 MG tablet TAKE 1/2 TO 1 TABLET(25 TO 50 MG) BY MOUTH AT BEDTIME AS NEEDED FOR SLEEP 90 tablet 1   Triamcinolone Acetonide (NASACORT ALLERGY 24HR NA) Place into the nose.     venlafaxine XR (EFFEXOR-XR) 150 MG 24 hr capsule TAKE 1 CAPSULE(150 MG) BY MOUTH DAILY WITH BREAKFAST 90 capsule 0   No current facility-administered medications on file prior to visit.      Objective:     Vitals:   02/02/22 1107  BP: 122/81  Pulse: 92   Filed Weights   02/02/22 1107  Weight: 153 lb 14.4 oz (69.8 kg)               Abdomen: Soft.  Non-tender.  No masses.  No HSM.  Incision/s: Intact.  Healing well.  No erythema.  No drainage.   Abdomen: Soft.  Non-tender.  No masses.  No HSM.  Incision/s: Intact.  Healing well.  No erythema.  No drainage.    Pelvic:   Vulva: Normal appearance.  No lesions.  Vagina: No lesions or abnormalities noted.  Support: Normal pelvic support.  Urethra No masses tenderness or scarring.  Meatus Normal size without lesions or prolapse  Vag Cuff: Intact.  No lesions.  Multiple areas of Vicryl suture noted.  Cuff appears to be healing normally.  Anus: Normal exam.  No lesions.  Perineum: Normal exam.  No lesions.        Bimanual   Adnexae: No masses.  Non-tender to palpation.  Cuff: Negative for abnormality.             Assessment:    G0P0 Patient Active Problem List   Diagnosis Date Noted   Acute left flank pain 10/25/2021   Depression, recurrent ([place])  03/06/2018   Ulcerative colitis ([place]) 10/05/2017   Migraine without aura and without status migrainosus, not intractable 05/17/2016   Seasonal allergic rhinitis 05/17/2016   Interstitial cystitis 09/04/2014   Endometriosis 06/30/2014     1. Postoperative state   2. Pelvic pain in female     Patient with normal-good progress postop.  Some mild right-sided pain and mild back pain.     Plan:            1.  Abdominal postop wound care discussed  2.  Patient to refrain from intercourse for 3 more weeks  3.  May resume normal activities with the exception of heavy lifting.  4.  Surgical pathology discussed in detail. Orders No orders of the defined types were placed in this encounter.   No orders of the defined types were placed in this encounter.     F/U  Return in about 3 months (around 05/04/2022).  Finis Bud, M.D. 02/02/2022 11:24 AM

## 2022-02-02 NOTE — Progress Notes (Signed)
Patient presents for 6 week postop follow-up following hysterectomy. She states no pain with urination or bleeding. She does report a dull aching pain in her stomach and still experiencing back pain. No additional concerns.

## 2022-02-21 ENCOUNTER — Ambulatory Visit: Payer: 59 | Admitting: Family Medicine

## 2022-03-15 ENCOUNTER — Ambulatory Visit: Payer: 59 | Admitting: Family Medicine

## 2022-03-15 ENCOUNTER — Encounter: Payer: Self-pay | Admitting: Family Medicine

## 2022-03-15 VITALS — BP 107/75 | HR 79 | Temp 98.1°F | Wt 155.0 lb

## 2022-03-15 DIAGNOSIS — M7061 Trochanteric bursitis, right hip: Secondary | ICD-10-CM | POA: Diagnosis not present

## 2022-03-15 NOTE — Assessment & Plan Note (Signed)
Acute, stable. Steroid injection given in right trochanteric bursa. Provided with stretching exercises. Recommend physical therapy and PRN Naprosyn if symptoms fail to improve. Return if symptoms fail to improve.

## 2022-03-15 NOTE — Progress Notes (Signed)
BP 107/75   Pulse 79   Temp 98.1 F (36.7 C) (Oral)   Wt 155 lb (70.3 kg)   LMP 12/15/2021 (Exact Date)   SpO2 99%   BMI 25.02 kg/m    Subjective:    Patient ID: Bethany Harrison, female    DOB: 1985-08-05, 37 y.o.   MRN: [id number]  HPI: Bethany Harrison is a 37 y.o. female  Chief Complaint  Patient presents with   Hip Pain    Patient says she started having issues last year and says she was seeing a Restaurant manager, fast food and says they tried dry needling and says it helps a little, but she thinks it may be muscular. Patient says she recently start working out again and noticed the issues more again.    HIP PAIN Duration: about 7-8 months Involved hip: right  Mechanism of injury: none Location: lateral Onset: gradual  Severity: severe  Quality: aching and burning, sore and tender Frequency: few times a week Radiation: no Aggravating factors: laying on it, moving, walking   Alleviating factors:  dry needling, massage, CBD oil, nothing, ice, HEP, APAP, NSAIDs, and rest  Status: worse Treatments attempted: rest, ice, heat, APAP, ibuprofen, aleve, HEP, and chiropractor   Relief with NSAIDs?: mild Weakness with weight bearing: no Weakness with walking: no Paresthesias / decreased sensation: yes Swelling: no Redness:no Fevers: no  Relevant past medical, surgical, family and social history reviewed and updated as indicated. Interim medical history since our last visit reviewed. Allergies and medications reviewed and updated.  Review of Systems  Constitutional: Negative.   Respiratory: Negative.    Cardiovascular: Negative.   Gastrointestinal: Negative.   Musculoskeletal:  Positive for arthralgias and myalgias. Negative for back pain, gait problem, joint swelling, neck pain and neck stiffness.  Skin: Negative.   Neurological: Negative.   Psychiatric/Behavioral: Negative.      Per HPI unless specifically indicated above     Objective:    BP 107/75   Pulse 79   Temp 98.1 F  (36.7 C) (Oral)   Wt 155 lb (70.3 kg)   LMP 12/15/2021 (Exact Date)   SpO2 99%   BMI 25.02 kg/m   Wt Readings from Last 3 Encounters:  03/15/22 155 lb (70.3 kg)  02/02/22 153 lb 14.4 oz (69.8 kg)  12/31/21 150 lb (68 kg)    Physical Exam Vitals and nursing note reviewed.  Constitutional:      General: She is not in acute distress.    Appearance: Normal appearance. She is not ill-appearing, toxic-appearing or diaphoretic.  HENT:     Head: Normocephalic and atraumatic.     Right Ear: External ear normal.     Left Ear: External ear normal.     Nose: Nose normal.     Mouth/Throat:     Mouth: Mucous membranes are moist.     Pharynx: Oropharynx is clear.  Eyes:     General: No scleral icterus.       Right eye: No discharge.        Left eye: No discharge.     Extraocular Movements: Extraocular movements intact.     Conjunctiva/sclera: Conjunctivae normal.     Pupils: Pupils are equal, round, and reactive to light.  Cardiovascular:     Rate and Rhythm: Normal rate and regular rhythm.     Pulses: Normal pulses.     Heart sounds: Normal heart sounds. No murmur heard.    No friction rub. No gallop.  Pulmonary:  Effort: Pulmonary effort is normal. No respiratory distress.     Breath sounds: Normal breath sounds. No stridor. No wheezing, rhonchi or rales.  Chest:     Chest wall: No tenderness.  Musculoskeletal:        General: Tenderness (R trochanteric bursa) present. No swelling, deformity or signs of injury.     Cervical back: Normal range of motion and neck supple.     Right lower leg: No edema.     Left lower leg: No edema.  Skin:    General: Skin is warm and dry.     Capillary Refill: Capillary refill takes less than 2 seconds.     Coloration: Skin is not jaundiced or pale.     Findings: No bruising, erythema, lesion or rash.  Neurological:     General: No focal deficit present.     Mental Status: She is alert and oriented to person, place, and time. Mental status is  at baseline.  Psychiatric:        Mood and Affect: Mood normal.        Behavior: Behavior normal.        Thought Content: Thought content normal.        Judgment: Judgment normal.     Results for orders placed or performed in visit on 12/28/21  POCT Urinalysis Dipstick  Result Value Ref Range   Color, UA orange    Clarity, UA clear    Glucose, UA Negative Negative   Bilirubin, UA neg    Ketones, UA neg    Spec Grav, UA 1.010 1.010 - 1.025   Blood, UA large    pH, UA 7.0 5.0 - 8.0   Protein, UA Negative Negative   Urobilinogen, UA 0.2 0.2 or 1.0 E.U./dL   Nitrite, UA neg    Leukocytes, UA Small (1+) (A) Negative   Appearance     Odor        Assessment & Plan:   Problem List Items Addressed This Visit       Musculoskeletal and Integument   Greater trochanteric bursitis of right hip - Primary    Acute, stable. Steroid injection given in right trochanteric bursa. Provided with stretching exercises. Recommend physical therapy and PRN Naprosyn if symptoms fail to improve. Return if symptoms fail to improve.        Procedure: Right Trochanteric Bursitis Steroid Injection        Diagnosis:   ICD-10-CM   1. Greater trochanteric bursitis of right hip  M70.61    X 7 months. Has failed conservative therapy. Injection given as below. Call with any concerns.      Physician: MJ Consent:  Risks, benefits, and alternative treatments discussed and all questions were answered.  Patient elected to proceed and verbal consent obtained.  Description: Area prepped and draped using  semi-sterile technique.  Using a lateral approach, a mixture of 3cc of 1% lidocaine & 1cc of Kenalog 40 was injected into the point of maximal tenderness.  A bandage was then placed over the injection site. Complications: none Post Procedure Instructions: Wound care instructions discussed and patient was instructed to keep area clean and dry.  Signs and symptoms of infection discussed, patient agrees to contact  the office ASAP should they occur.  Follow Up: Return if symptoms worsen or fail to improve.   Follow up plan: Return if symptoms worsen or fail to improve.

## 2022-03-15 NOTE — Progress Notes (Signed)
BP 107/75   Pulse 79   Temp 98.1 F (36.7 C) (Oral)   Wt 155 lb (70.3 kg)   LMP 12/15/2021 (Exact Date)   SpO2 99%   BMI 25.02 kg/m    Subjective:    Patient ID: Bethany Harrison, female    DOB: 05-03-1985, 37 y.o.   MRN: [id number]  HPI: Bethany Harrison is a 37 y.o. female  Chief Complaint  Patient presents with   Hip Pain    Patient says she started having issues last year and says she was seeing a Restaurant manager, fast food and says they tried dry needling and says it helps a little, but she thinks it may be muscular. Patient says she recently start working out again and noticed the issues more again.    HIP PAIN Was seeing chiropractor, and did dry needling for 2 months that provided minimal relief but pain is still there, mostly after physical activity (treadmill hiking). Pain is worsened when laying on affected side at night, sometimes awakens her from sleep. Duration: 7-8 months Involved hip: right  Mechanism of injury: No Location: lateral Onset: gradual  Severity: 10/10  Quality: aching, burning, sore, and tender Frequency: a few times a week Radiation:No Aggravating factors: Walking, prolonged sitting, treadmill hiking Alleviating factors: ice, rest, dry needling, massage therapy with gun, CBD oil; that provided minimal relief.  Status: stable; when not actively working out or moving.  Treatments attempted: rest, ice, and ibuprofen   Relief with NSAIDs?: mild Weakness with weight bearing: no Weakness with walking: no Paresthesias / decreased sensation:  Numbness with pain occasionally Swelling: no Redness:no Fevers: no  Relevant past medical, surgical, family and social history reviewed and updated as indicated. Interim medical history since our last visit reviewed. Allergies and medications reviewed and updated.  Review of Systems  Respiratory:  Negative for apnea, cough, choking, chest tightness, shortness of breath, wheezing and stridor.   Cardiovascular:  Negative for chest  pain, palpitations and leg swelling.  Musculoskeletal:  Positive for arthralgias, back pain and myalgias. Negative for gait problem, joint swelling, neck pain and neck stiffness.  Neurological: Negative.     Per HPI unless specifically indicated above     Objective:    BP 107/75   Pulse 79   Temp 98.1 F (36.7 C) (Oral)   Wt 155 lb (70.3 kg)   LMP 12/15/2021 (Exact Date)   SpO2 99%   BMI 25.02 kg/m   Wt Readings from Last 3 Encounters:  03/15/22 155 lb (70.3 kg)  02/02/22 153 lb 14.4 oz (69.8 kg)  12/31/21 150 lb (68 kg)    Physical Exam Vitals and nursing note reviewed.  Constitutional:      General: She is awake. She is not in acute distress.    Appearance: She is well-developed and well-groomed. She is not ill-appearing.  HENT:     Head: Normocephalic and atraumatic.     Right Ear: Hearing and external ear normal. No drainage.     Left Ear: Hearing and external ear normal. No drainage.     Nose: Nose normal.  Eyes:     General: Lids are normal.        Right eye: No discharge.        Left eye: No discharge.     Conjunctiva/sclera: Conjunctivae normal.  Cardiovascular:     Rate and Rhythm: Normal rate and regular rhythm.     Heart sounds: Normal heart sounds, S1 normal and S2 normal. No murmur heard.  No gallop.  Pulmonary:     Effort: No accessory muscle usage or respiratory distress.     Breath sounds: Normal breath sounds.  Musculoskeletal:        General: Normal range of motion.     Right lower leg: No edema.     Left lower leg: No edema.     Comments: R lateral hip tenderness  Skin:    General: Skin is warm and dry.     Capillary Refill: Capillary refill takes less than 2 seconds.  Neurological:     Mental Status: She is alert and oriented to person, place, and time.  Psychiatric:        Attention and Perception: Attention normal.        Mood and Affect: Mood normal.        Speech: Speech normal.        Behavior: Behavior normal. Behavior is  cooperative.        Thought Content: Thought content normal.     Results for orders placed or performed in visit on 12/28/21  POCT Urinalysis Dipstick  Result Value Ref Range   Color, UA orange    Clarity, UA clear    Glucose, UA Negative Negative   Bilirubin, UA neg    Ketones, UA neg    Spec Grav, UA 1.010 1.010 - 1.025   Blood, UA large    pH, UA 7.0 5.0 - 8.0   Protein, UA Negative Negative   Urobilinogen, UA 0.2 0.2 or 1.0 E.U./dL   Nitrite, UA neg    Leukocytes, UA Small (1+) (A) Negative   Appearance     Odor        Assessment & Plan:   Problem List Items Addressed This Visit       Musculoskeletal and Integument   Greater trochanteric bursitis of right hip - Primary    Acute, stable. Steroid injection given in right trochanteric bursa. Provided with stretching exercises. Recommend physical therapy and PRN Naprosyn if symptoms fail to improve. Return if symptoms fail to improve.         Follow up plan: Return if symptoms worsen or fail to improve.

## 2022-03-18 ENCOUNTER — Encounter: Payer: Self-pay | Admitting: Physician Assistant

## 2022-03-18 ENCOUNTER — Ambulatory Visit: Payer: 59 | Admitting: Physician Assistant

## 2022-03-18 VITALS — BP 111/75 | HR 90 | Temp 98.1°F | Wt 151.1 lb

## 2022-03-18 DIAGNOSIS — J9801 Acute bronchospasm: Secondary | ICD-10-CM

## 2022-03-18 MED ORDER — BENZONATATE 100 MG PO CAPS: 100.0000 mg | ORAL_CAPSULE | Freq: Two times a day (BID) | ORAL | 0 refills | Status: DC | PRN

## 2022-03-18 MED ORDER — PREDNISONE 20 MG PO TABS: ORAL_TABLET | ORAL | 0 refills | Status: DC

## 2022-03-18 NOTE — Progress Notes (Unsigned)
Acute Office Visit   Patient: Bethany Harrison   DOB: 26-Apr-1985   37 y.o. Female  MRN: [id number] Visit Date: 03/18/2022  Today's healthcare provider: Dani Gobble Aland Chestnutt, PA-C  Introduced myself to the patient as a Journalist, newspaper and provided education on APPs in clinical practice.    Chief Complaint  Patient presents with   Cough    Pt states she has been having a cough for the last 2 weeks. States the cough is worse at night and when she exercises. States the cough is dry, sometimes it is not. States she has been drinking Elderberry. States she can feel like liquid moving in her R ear when she coughs as well.    Subjective    Cough Pertinent negatives include no chills, fever, postnasal drip, rhinorrhea, sore throat, shortness of breath or wheezing.   HPI     Cough    Additional comments: Pt states she has been having a cough for the last 2 weeks. States the cough is worse at night and when she exercises. States the cough is dry, sometimes it is not. States she has been drinking Elderberry. States she can feel like liquid moving in her R ear when she coughs as well.       Last edited by Georgina Peer, CMA on 03/18/2022  2:55 PM.      Persistent dry cough  Onset: sudden  Duration: 2 weeks - reports this started with mild sore throat and then progressed to include coughing  She denies congestion, rhinorrhea, sinus pressure or pain  Associated symptoms:  reports coughing spells that are predominantly dry cough but will sometimes be productive- unsure if it is clear or not Reports she sometimes gags from more intense coughing spells   Interventions: cold and flu multi-symptom medications  Alleviating: nothing  Aggravating: exercising, temp elevation, cough is worse at night       Medications: Outpatient Medications Prior to Visit  Medication Sig   cyclobenzaprine (FLEXERIL) 5 MG tablet Take 1 tablet (5 mg total) by mouth 3 (three) times daily as needed for muscle spasms.    eletriptan (RELPAX) 40 MG tablet Take 1 tablet (40 mg total) by mouth as needed for migraine or headache. May repeat in 2 hours if headache persists or recurs.   Galcanezumab-gnlm (EMGALITY) 120 MG/ML SOAJ Inject 120 mg into the skin every 30 (thirty) days.   meclizine (ANTIVERT) 12.5 MG tablet Take 1 tablet (12.5 mg total) by mouth 3 (three) times daily. (Patient taking differently: Take 12.5 mg by mouth 3 (three) times daily as needed.)   ondansetron (ZOFRAN-ODT) 4 MG disintegrating tablet Take 1 tablet (4 mg total) by mouth every 8 (eight) hours as needed.   phentermine (ADIPEX-P) 37.5 MG tablet Take 37.5 mg by mouth daily before breakfast.   topiramate (TOPAMAX) 100 MG tablet Take 1 tablet (100 mg total) by mouth daily.   traZODone (DESYREL) 50 MG tablet TAKE 1/2 TO 1 TABLET(25 TO 50 MG) BY MOUTH AT BEDTIME AS NEEDED FOR SLEEP   Triamcinolone Acetonide (NASACORT ALLERGY 24HR NA) Place into the nose.   venlafaxine XR (EFFEXOR-XR) 150 MG 24 hr capsule TAKE 1 CAPSULE(150 MG) BY MOUTH DAILY WITH BREAKFAST   No facility-administered medications prior to visit.    Review of Systems  Constitutional:  Positive for fatigue. Negative for chills and fever.  HENT:  Negative for congestion, postnasal drip, rhinorrhea, sinus pressure, sinus pain and sore throat.  Respiratory:  Positive for cough. Negative for chest tightness, shortness of breath and wheezing.   Gastrointestinal:  Negative for nausea and vomiting.    {Labs  Heme  Chem  Endocrine  Serology  Results Review (optional):23779}   Objective    BP 111/75   Pulse 90   Temp 98.1 F (36.7 C) (Oral)   Wt 151 lb 1.6 oz (68.5 kg)   LMP 12/15/2021 (Exact Date)   SpO2 99%   BMI 24.39 kg/m  {Show previous vital signs (optional):23777}  Physical Exam Vitals reviewed.  Constitutional:      General: She is awake.     Appearance: Normal appearance. She is well-developed and well-groomed.  HENT:     Head: Normocephalic and  atraumatic.  Eyes:     General: Lids are normal. Gaze aligned appropriately.     Extraocular Movements: Extraocular movements intact.     Conjunctiva/sclera: Conjunctivae normal.  Cardiovascular:     Rate and Rhythm: Normal rate and regular rhythm.     Pulses: Normal pulses.          Radial pulses are 2+ on the right side and 2+ on the left side.     Heart sounds: Normal heart sounds. No murmur heard.    No friction rub. No gallop.  Pulmonary:     Effort: Pulmonary effort is normal.     Breath sounds: Normal breath sounds. No decreased air movement. No decreased breath sounds, wheezing, rhonchi or rales.  Musculoskeletal:     Right lower leg: No edema.     Left lower leg: No edema.  Neurological:     Mental Status: She is alert.  Psychiatric:        Behavior: Behavior is cooperative.       No results found for any visits on 03/18/22.  Assessment & Plan      No follow-ups on file.     Problem List Items Addressed This Visit   None Visit Diagnoses     Cough due to bronchospasm    -  Primary   Relevant Medications   predniSONE (DELTASONE) 20 MG tablet        No follow-ups on file.   I, Kennette Cuthrell E Kay Ricciuti, PA-C, have reviewed all documentation for this visit. The documentation on 03/18/22 for the exam, diagnosis, procedures, and orders are all accurate and complete.   Talitha Givens, MHS, PA-C [facility]

## 2022-03-24 ENCOUNTER — Encounter: Payer: Self-pay | Admitting: Family Medicine

## 2022-03-25 ENCOUNTER — Ambulatory Visit: Payer: 59 | Admitting: Family Medicine

## 2022-03-25 ENCOUNTER — Ambulatory Visit
Admission: RE | Admit: 2022-03-25 | Discharge: 2022-03-25 | Disposition: A | Discharge status: Home / Self Care | Payer: 59 | Attending: Family Medicine | Admitting: Family Medicine

## 2022-03-25 ENCOUNTER — Encounter: Payer: Self-pay | Admitting: Family Medicine

## 2022-03-25 ENCOUNTER — Ambulatory Visit
Admission: RE | Admit: 2022-03-25 | Discharge: 2022-03-25 | Disposition: A | Discharge status: Home / Self Care | Payer: 59 | Source: Ambulatory Visit | Attending: Family Medicine | Admitting: Family Medicine

## 2022-03-25 VITALS — BP 111/74 | HR 99 | Temp 98.6°F | Ht 66.0 in | Wt 150.1 lb

## 2022-03-25 DIAGNOSIS — R052 Subacute cough: Secondary | ICD-10-CM | POA: Insufficient documentation

## 2022-03-25 DIAGNOSIS — J209 Acute bronchitis, unspecified: Secondary | ICD-10-CM

## 2022-03-25 MED ORDER — DOXYCYCLINE HYCLATE 100 MG PO TABS: 100.0000 mg | ORAL_TABLET | Freq: Two times a day (BID) | ORAL | 0 refills | Status: DC

## 2022-03-25 MED ORDER — BUDESONIDE-FORMOTEROL FUMARATE 160-4.5 MCG/ACT IN AERO: 2.0000 | INHALATION_SPRAY | Freq: Two times a day (BID) | RESPIRATORY_TRACT | 0 refills | Status: DC

## 2022-03-25 NOTE — Progress Notes (Signed)
BP 111/74   Pulse 99   Temp 98.6 F (37 C) (Oral)   Ht '5\' 6"'$  (1.676 m)   Wt 150 lb 1.6 oz (68.1 kg)   LMP 12/15/2021 (Exact Date)   SpO2 98%   BMI 24.23 kg/m    Subjective:    Patient ID: Bethany Harrison, female    DOB: 1985-06-14, 37 y.o.   MRN: [id number]  HPI: Bethany Harrison is a 37 y.o. female  Chief Complaint  Patient presents with   Cough    Patient says she was seen Friday and was prescribed Prednisone and says she still feels awful says it feels swollen in her throat area. Patient says this is going on three weeks.    UPPER RESPIRATORY TRACT INFECTION Duration: 3 weeks Worst symptom: cough Fever: no Cough: yes Shortness of breath: yes Wheezing: no Chest pain: yes, with cough Chest tightness: yes Chest congestion: yes Nasal congestion: no Runny nose: yes Post nasal drip: yes Sneezing: yes Sore throat: no Swollen glands: no Sinus pressure: no Headache: yes Face pain: no Toothache: no Ear pain: yes "right Ear pressure: no  Eyes red/itching:no Eye drainage/crusting: no  Vomiting: no Rash: no Fatigue: yes Sick contacts: no Strep contacts: no  Context: worse Recurrent sinusitis: no Relief with OTC cold/cough medications: no  Treatments attempted: prednisone   Relevant past medical, surgical, family and social history reviewed and updated as indicated. Interim medical history since our last visit reviewed. Allergies and medications reviewed and updated.  Review of Systems  Constitutional:  Positive for fatigue. Negative for activity change, appetite change, chills, diaphoresis, fever and unexpected weight change.  HENT:  Positive for congestion, postnasal drip, sinus pressure and sore throat. Negative for dental problem, drooling, ear discharge, ear pain, facial swelling, hearing loss, mouth sores, nosebleeds, rhinorrhea, sinus pain, sneezing, tinnitus, trouble swallowing and voice change.   Respiratory:  Positive for cough, shortness of breath and  wheezing. Negative for apnea, choking, chest tightness and stridor.   Cardiovascular: Negative.  Negative for chest pain, palpitations and leg swelling.  Musculoskeletal: Negative.   Skin: Negative.   Psychiatric/Behavioral: Negative.      Per HPI unless specifically indicated above     Objective:    BP 111/74   Pulse 99   Temp 98.6 F (37 C) (Oral)   Ht '5\' 6"'$  (1.676 m)   Wt 150 lb 1.6 oz (68.1 kg)   LMP 12/15/2021 (Exact Date)   SpO2 98%   BMI 24.23 kg/m   Wt Readings from Last 3 Encounters:  03/25/22 150 lb 1.6 oz (68.1 kg)  03/18/22 151 lb 1.6 oz (68.5 kg)  03/15/22 155 lb (70.3 kg)    Physical Exam Vitals and nursing note reviewed.  Constitutional:      General: She is not in acute distress.    Appearance: Normal appearance. She is not ill-appearing, toxic-appearing or diaphoretic.  HENT:     Head: Normocephalic and atraumatic.     Comments: EAC red and dry on the R    Right Ear: Tympanic membrane and external ear normal.     Left Ear: Tympanic membrane, ear canal and external ear normal.     Nose: Nose normal.     Mouth/Throat:     Mouth: Mucous membranes are moist.     Pharynx: Oropharynx is clear.  Eyes:     General: No scleral icterus.       Right eye: No discharge.        Left  eye: No discharge.     Extraocular Movements: Extraocular movements intact.     Conjunctiva/sclera: Conjunctivae normal.     Pupils: Pupils are equal, round, and reactive to light.  Cardiovascular:     Rate and Rhythm: Normal rate and regular rhythm.     Pulses: Normal pulses.     Heart sounds: Normal heart sounds. No murmur heard.    No friction rub. No gallop.  Pulmonary:     Effort: Pulmonary effort is normal. No respiratory distress.     Breath sounds: Normal breath sounds. No stridor. No wheezing, rhonchi or rales.  Chest:     Chest wall: No tenderness.  Musculoskeletal:        General: Normal range of motion.     Cervical back: Normal range of motion and neck supple.   Skin:    General: Skin is warm and dry.     Capillary Refill: Capillary refill takes less than 2 seconds.     Coloration: Skin is not jaundiced or pale.     Findings: No bruising, erythema, lesion or rash.  Neurological:     General: No focal deficit present.     Mental Status: She is alert and oriented to person, place, and time. Mental status is at baseline.  Psychiatric:        Mood and Affect: Mood normal.        Behavior: Behavior normal.        Thought Content: Thought content normal.        Judgment: Judgment normal.     Results for orders placed or performed in visit on 12/28/21  POCT Urinalysis Dipstick  Result Value Ref Range   Color, UA orange    Clarity, UA clear    Glucose, UA Negative Negative   Bilirubin, UA neg    Ketones, UA neg    Spec Grav, UA 1.010 1.010 - 1.025   Blood, UA large    pH, UA 7.0 5.0 - 8.0   Protein, UA Negative Negative   Urobilinogen, UA 0.2 0.2 or 1.0 E.U./dL   Nitrite, UA neg    Leukocytes, UA Small (1+) (A) Negative   Appearance     Odor        Assessment & Plan:   Problem List Items Addressed This Visit   None Visit Diagnoses     Acute bronchitis, unspecified organism    -  Primary   Sick x 3 weeks and worsening. Will check x-ray and treat with symbicort and doxycycline. Recheck in about a week. Call with any concerns.   Subacute cough       Relevant Orders   DG Chest 2 View        Follow up plan: Return in about 1 week (around 04/01/2022).

## 2022-03-28 ENCOUNTER — Ambulatory Visit: Payer: 59 | Admitting: Family Medicine

## 2022-04-01 ENCOUNTER — Encounter: Payer: Self-pay | Admitting: Family Medicine

## 2022-04-01 ENCOUNTER — Ambulatory Visit: Payer: 59 | Admitting: Family Medicine

## 2022-04-01 VITALS — BP 112/76 | HR 99 | Wt 146.8 lb

## 2022-04-01 DIAGNOSIS — R052 Subacute cough: Secondary | ICD-10-CM | POA: Diagnosis not present

## 2022-04-01 MED ORDER — BENZONATATE 100 MG PO CAPS: 100.0000 mg | ORAL_CAPSULE | Freq: Two times a day (BID) | ORAL | 1 refills | Status: DC | PRN

## 2022-04-01 NOTE — Progress Notes (Signed)
BP 112/76   Pulse 99   Wt 146 lb 12.8 oz (66.6 kg)   LMP 12/15/2021 (Exact Date)   SpO2 100%   BMI 23.69 kg/m    Subjective:    Patient ID: Bethany Harrison, female    DOB: 1985/03/14, 37 y.o.   MRN: [id number]  HPI: [name] is a 37 y.o. female  Chief Complaint  Patient presents with   Cough   Feeling about 30% better. She notes that her inhaler is making her a little dizzy so she has only been using it 1x a day. She does feel like she is starting to bring things up and is coughing up green. She still has 3 days of her antibiotic left. She is otherwise feeling well with no other concerns or complaints at this time.   Relevant past medical, surgical, family and social history reviewed and updated as indicated. Interim medical history since our last visit reviewed. Allergies and medications reviewed and updated.  Review of Systems  Constitutional: Negative.   Respiratory:  Positive for cough. Negative for apnea, choking, chest tightness, shortness of breath, wheezing and stridor.   Cardiovascular: Negative.   Musculoskeletal: Negative.   Psychiatric/Behavioral: Negative.      Per HPI unless specifically indicated above     Objective:    BP 112/76   Pulse 99   Wt 146 lb 12.8 oz (66.6 kg)   LMP 12/15/2021 (Exact Date)   SpO2 100%   BMI 23.69 kg/m   Wt Readings from Last 3 Encounters:  04/01/22 146 lb 12.8 oz (66.6 kg)  03/25/22 150 lb 1.6 oz (68.1 kg)  03/18/22 151 lb 1.6 oz (68.5 kg)    Physical Exam Vitals and nursing note reviewed.  Constitutional:      General: She is not in acute distress.    Appearance: Normal appearance. She is not ill-appearing, toxic-appearing or diaphoretic.  HENT:     Head: Normocephalic and atraumatic.     Right Ear: External ear normal.     Left Ear: External ear normal.     Nose: Nose normal.     Mouth/Throat:     Mouth: Mucous membranes are moist.     Pharynx: Oropharynx is clear.  Eyes:     General: No scleral icterus.        Right eye: No discharge.        Left eye: No discharge.     Extraocular Movements: Extraocular movements intact.     Conjunctiva/sclera: Conjunctivae normal.     Pupils: Pupils are equal, round, and reactive to light.  Cardiovascular:     Rate and Rhythm: Normal rate and regular rhythm.     Pulses: Normal pulses.     Heart sounds: Normal heart sounds. No murmur heard.    No friction rub. No gallop.  Pulmonary:     Effort: Pulmonary effort is normal. No respiratory distress.     Breath sounds: Normal breath sounds. No stridor. No wheezing, rhonchi or rales.     Comments: + forced expiratory wheezes Chest:     Chest wall: No tenderness.  Musculoskeletal:        General: Normal range of motion.     Cervical back: Normal range of motion and neck supple.  Skin:    General: Skin is warm and dry.     Capillary Refill: Capillary refill takes less than 2 seconds.     Coloration: Skin is not jaundiced or pale.     Findings:  No bruising, erythema, lesion or rash.  Neurological:     General: No focal deficit present.     Mental Status: She is alert and oriented to person, place, and time. Mental status is at baseline.  Psychiatric:        Mood and Affect: Mood normal.        Behavior: Behavior normal.        Thought Content: Thought content normal.        Judgment: Judgment normal.     Results for orders placed or performed in visit on 12/28/21  POCT Urinalysis Dipstick  Result Value Ref Range   Color, UA orange    Clarity, UA clear    Glucose, UA Negative Negative   Bilirubin, UA neg    Ketones, UA neg    Spec Grav, UA 1.010 1.010 - 1.025   Blood, UA large    pH, UA 7.0 5.0 - 8.0   Protein, UA Negative Negative   Urobilinogen, UA 0.2 0.2 or 1.0 E.U./dL   Nitrite, UA neg    Leukocytes, UA Small (1+) (A) Negative   Appearance     Odor        Assessment & Plan:   Problem List Items Addressed This Visit   None Visit Diagnoses     Subacute cough    -  Primary    Improving slowly. Will continue symbicort and recheck 2 weeks. Call with any concerns.   Relevant Medications   benzonatate (TESSALON) 100 MG capsule        Follow up plan: Return in about 2 weeks (around 04/15/2022).

## 2022-04-04 ENCOUNTER — Encounter: Payer: Self-pay | Admitting: Family Medicine

## 2022-04-05 ENCOUNTER — Other Ambulatory Visit: Payer: Self-pay | Admitting: Family Medicine

## 2022-04-05 ENCOUNTER — Other Ambulatory Visit: Payer: Self-pay | Admitting: Nurse Practitioner

## 2022-04-05 ENCOUNTER — Ambulatory Visit: Payer: 59 | Admitting: Neurology

## 2022-04-05 MED ORDER — CYCLOBENZAPRINE HCL 5 MG PO TABS: 5.0000 mg | ORAL_TABLET | Freq: Three times a day (TID) | ORAL | 1 refills | Status: DC | PRN

## 2022-04-06 ENCOUNTER — Other Ambulatory Visit: Payer: Self-pay

## 2022-04-06 MED ORDER — VENLAFAXINE HCL ER 150 MG PO CP24: ORAL_CAPSULE | ORAL | 0 refills | Status: DC

## 2022-04-06 NOTE — Telephone Encounter (Signed)
Requested Prescriptions  Pending Prescriptions Disp Refills   traZODone (DESYREL) 50 MG tablet [Pharmacy Med Name: traZODone HCl 50 MG Oral Tablet] 90 tablet 3    Sig: TAKE 1/2 TO 1 TABLET BY MOUTH AT BEDTIME AS NEEDED FOR SLEEP     Psychiatry: Antidepressants - Serotonin Modulator Passed - 04/05/2022 10:08 AM      Passed - Completed PHQ-2 or PHQ-9 in the last 360 days      Passed - Valid encounter within last 6 months    Recent Outpatient Visits           5 days ago Subacute cough   [place], DO   1 week ago Acute bronchitis, unspecified organism   [place], Megan P, DO   2 weeks ago Cough due to bronchospasm   [place], Erin E, PA-C   3 weeks ago Greater trochanteric bursitis of right hip   [place] P, DO   5 months ago Acute left flank pain   [place] Marty, Henrine Screws T, NP       Future Appointments             In 2 weeks Wynetta Emery, Barb Merino, DO [place], [facility]

## 2022-04-12 ENCOUNTER — Ambulatory Visit: Payer: 59 | Admitting: Family Medicine

## 2022-04-20 ENCOUNTER — Ambulatory Visit: Payer: 59 | Admitting: Family Medicine

## 2022-04-20 ENCOUNTER — Encounter: Payer: Self-pay | Admitting: Family Medicine

## 2022-04-20 VITALS — BP 113/80 | HR 94 | Temp 98.0°F | Wt 143.4 lb

## 2022-04-20 DIAGNOSIS — R300 Dysuria: Secondary | ICD-10-CM | POA: Diagnosis not present

## 2022-04-20 DIAGNOSIS — N3001 Acute cystitis with hematuria: Secondary | ICD-10-CM

## 2022-04-20 LAB — URINALYSIS, ROUTINE W REFLEX MICROSCOPIC: Specific Gravity, UA: 1.02 (ref 1.005–1.030)

## 2022-04-20 LAB — MICROSCOPIC EXAMINATION

## 2022-04-20 MED ORDER — CIPROFLOXACIN HCL 500 MG PO TABS: 500.0000 mg | ORAL_TABLET | Freq: Two times a day (BID) | ORAL | 0 refills | Status: AC

## 2022-04-20 NOTE — Progress Notes (Signed)
BP 113/80   Pulse 94   Temp 98 F (36.7 C) (Oral)   Wt 143 lb 6.4 oz (65 kg)   LMP 12/15/2021 (Exact Date)   SpO2 100%   BMI 23.15 kg/m    Subjective:    Patient ID: Bethany Harrison, female    DOB: [birth year], 37 y.o.   MRN: [id number]  HPI: Bethany Harrison is a 37 y.o. female  Chief Complaint  Patient presents with   Cough   Urinary Tract Infection    Patient says since Monday she has had burning with urination and urinary frequency. Patient says she had a hysterectomy back in December.    Urinary Frequency   Cough 90% better!  URINARY SYMPTOMS Duration: 3 days Dysuria: yes Urinary frequency: yes Urgency: yes Small volume voids: yes Symptom severity: moderate Urinary incontinence: no Foul odor: yes Hematuria: no Abdominal pain: yes Back pain: no Suprapubic pain/pressure: yes Flank pain: no Fever:  no Vomiting: no Relief with cranberry juice: no Relief with pyridium: no Status: worse Previous urinary tract infection: yes Recurrent urinary tract infection: no Sexual activity: monogomous History of sexually transmitted disease: no Vaginal discharge: no Treatments attempted: increasing fluids   Relevant past medical, surgical, family and social history reviewed and updated as indicated. Interim medical history since our last visit reviewed. Allergies and medications reviewed and updated.  Review of Systems  Constitutional: Negative.   Respiratory: Negative.    Cardiovascular: Negative.   Gastrointestinal: Negative.   Genitourinary:  Positive for dysuria, frequency and urgency. Negative for decreased urine volume, difficulty urinating, dyspareunia, enuresis, flank pain, genital sores, hematuria, menstrual problem, pelvic pain, vaginal bleeding, vaginal discharge and vaginal pain.  Musculoskeletal: Negative.   Psychiatric/Behavioral: Negative.      Per HPI unless specifically indicated above     Objective:    BP 113/80   Pulse 94   Temp 98 F (36.7 C)  (Oral)   Wt 143 lb 6.4 oz (65 kg)   LMP 12/15/2021 (Exact Date)   SpO2 100%   BMI 23.15 kg/m   Wt Readings from Last 3 Encounters:  04/20/22 143 lb 6.4 oz (65 kg)  04/01/22 146 lb 12.8 oz (66.6 kg)  03/25/22 150 lb 1.6 oz (68.1 kg)    Physical Exam Vitals and nursing note reviewed.  Constitutional:      General: She is not in acute distress.    Appearance: Normal appearance. She is normal weight. She is not ill-appearing, toxic-appearing or diaphoretic.  HENT:     Head: Normocephalic and atraumatic.     Right Ear: External ear normal.     Left Ear: External ear normal.     Nose: Nose normal.     Mouth/Throat:     Mouth: Mucous membranes are moist.     Pharynx: Oropharynx is clear.  Eyes:     General: No scleral icterus.       Right eye: No discharge.        Left eye: No discharge.     Extraocular Movements: Extraocular movements intact.     Conjunctiva/sclera: Conjunctivae normal.     Pupils: Pupils are equal, round, and reactive to light.  Cardiovascular:     Rate and Rhythm: Normal rate and regular rhythm.     Pulses: Normal pulses.     Heart sounds: Normal heart sounds. No murmur heard.    No friction rub. No gallop.  Pulmonary:     Effort: Pulmonary effort is normal. No respiratory distress.  Breath sounds: Normal breath sounds. No stridor. No wheezing, rhonchi or rales.  Chest:     Chest wall: No tenderness.  Musculoskeletal:        General: Normal range of motion.     Cervical back: Normal range of motion and neck supple.  Skin:    General: Skin is warm and dry.     Capillary Refill: Capillary refill takes less than 2 seconds.     Coloration: Skin is not jaundiced or pale.     Findings: No bruising, erythema, lesion or rash.  Neurological:     General: No focal deficit present.     Mental Status: She is alert and oriented to person, place, and time. Mental status is at baseline.  Psychiatric:        Mood and Affect: Mood normal.        Behavior: Behavior  normal.        Thought Content: Thought content normal.        Judgment: Judgment normal.     Results for orders placed or performed in visit on 12/28/21  POCT Urinalysis Dipstick  Result Value Ref Range   Color, UA orange    Clarity, UA clear    Glucose, UA Negative Negative   Bilirubin, UA neg    Ketones, UA neg    Spec Grav, UA 1.010 1.010 - 1.025   Blood, UA large    pH, UA 7.0 5.0 - 8.0   Protein, UA Negative Negative   Urobilinogen, UA 0.2 0.2 or 1.0 E.U./dL   Nitrite, UA neg    Leukocytes, UA Small (1+) (A) Negative   Appearance     Odor        Assessment & Plan:   Problem List Items Addressed This Visit   None Visit Diagnoses     Acute cystitis with hematuria    -  Primary   Will treat with cipro and send for culture. Await results. Call with any concerns.   Relevant Orders   Urine Culture   Dysuria       + leuks   Relevant Orders   Urinalysis, Routine w reflex microscopic        Follow up plan: Return if symptoms worsen or fail to improve.

## 2022-04-23 LAB — URINE CULTURE

## 2022-04-24 ENCOUNTER — Encounter: Payer: Self-pay | Admitting: Family Medicine

## 2022-04-25 ENCOUNTER — Other Ambulatory Visit: Payer: Self-pay | Admitting: Family Medicine

## 2022-04-25 DIAGNOSIS — R300 Dysuria: Secondary | ICD-10-CM

## 2022-04-26 ENCOUNTER — Other Ambulatory Visit: Payer: 59

## 2022-04-26 DIAGNOSIS — R300 Dysuria: Secondary | ICD-10-CM

## 2022-04-26 LAB — WET PREP FOR TRICH, YEAST, CLUE
Clue Cell Exam: POSITIVE — AB
Trichomonas Exam: NEGATIVE
Yeast Exam: NEGATIVE

## 2022-04-26 LAB — URINALYSIS, ROUTINE W REFLEX MICROSCOPIC
Bilirubin, UA: NEGATIVE
Glucose, UA: NEGATIVE
Ketones, UA: NEGATIVE
Leukocytes,UA: NEGATIVE
Nitrite, UA: NEGATIVE
Protein,UA: NEGATIVE
RBC, UA: NEGATIVE
Specific Gravity, UA: 1.015 (ref 1.005–1.030)
Urobilinogen, Ur: 0.2 mg/dL (ref 0.2–1.0)
pH, UA: 7 (ref 5.0–7.5)

## 2022-04-27 ENCOUNTER — Encounter: Payer: Self-pay | Admitting: Obstetrics and Gynecology

## 2022-04-28 ENCOUNTER — Ambulatory Visit: Payer: Self-pay

## 2022-04-28 NOTE — Telephone Encounter (Signed)
Patient called in says had  appt on apr 3, and she has BV and she still has urgency to urine, and was waiting on Dr Laural Benes to send her meds, She is requesting it be sent to [facility]. [address] [place], [place] [address]   Chief Complaint: Urinary urgency,low abdominal discomfort. States Dr. Laural Benes was going to call medicine in for BV. Symptoms: Above Frequency: 04/18/22 Pertinent Negatives: Patient denies  Disposition: [] ED /[] Urgent Care (no appt availability in office) / [] Appointment(In office/virtual)/ []  [facility]/ [] Home Care/ [] Refused Recommended Disposition /[] [facility]/ [x]  Follow-up with PCP Additional Notes: Please advise pt.  Answer Assessment - Initial Assessment Questions 1. SYMPTOM: "What's the main symptom you're concerned about?" (e.g., frequency, incontinence)     Urgency, low stomach pain 2. ONSET: "When did the    start?"     04/18/22 3. PAIN: "Is there any pain?" If Yes, ask: "How bad is it?" (Scale: 1-10; mild, moderate, severe)     5 4. CAUSE: "What do you think is causing the symptoms?"     BV 5. OTHER SYMPTOMS: "Do you have any other symptoms?" (e.g., blood in urine, fever, flank pain, pain with urination)     No 6. PREGNANCY: "Is there any chance you are pregnant?" "When was your last menstrual period?"     No  Protocols used: Urinary Symptoms-A-AH

## 2022-04-29 ENCOUNTER — Telehealth: Payer: Self-pay | Admitting: Family Medicine

## 2022-04-29 ENCOUNTER — Other Ambulatory Visit: Payer: Self-pay

## 2022-04-29 DIAGNOSIS — N760 Acute vaginitis: Secondary | ICD-10-CM

## 2022-04-29 LAB — URINE CULTURE

## 2022-04-29 MED ORDER — METRONIDAZOLE 500 MG PO TABS
500.0000 mg | ORAL_TABLET | Freq: Two times a day (BID) | ORAL | 0 refills | Status: DC
Start: 2022-04-29 — End: 2022-05-31

## 2022-04-29 NOTE — Telephone Encounter (Signed)
Patient calling back, and states that since she did not get medication and had to go to another provider she should not be charged for a telephone call Patient also stated that she spoke with Billing and was told that the office would have to stop the charges Please Advise

## 2022-04-29 NOTE — Telephone Encounter (Signed)
The patient called back in stating since she hasn't heard back from her primary she had her ob call in her medicine. She states she is going to call the billing office because she does not want to be charged for a triage visit when she never got her medicine.

## 2022-04-29 NOTE — Telephone Encounter (Signed)
Pt is calling to report that she still has not received medication for BV. Please advise Pharmacy- [facility]

## 2022-04-29 NOTE — Telephone Encounter (Signed)
She doesn't get charged for that anyway.

## 2022-05-04 ENCOUNTER — Encounter: Payer: 59 | Admitting: Obstetrics and Gynecology

## 2022-05-04 DIAGNOSIS — Z9889 Other specified postprocedural states: Secondary | ICD-10-CM

## 2022-05-05 ENCOUNTER — Encounter: Payer: Self-pay | Admitting: Obstetrics and Gynecology

## 2022-05-05 ENCOUNTER — Ambulatory Visit (INDEPENDENT_AMBULATORY_CARE_PROVIDER_SITE_OTHER): Payer: 59 | Admitting: Obstetrics and Gynecology

## 2022-05-05 VITALS — BP 114/75 | HR 66 | Ht 66.0 in | Wt 142.9 lb

## 2022-05-05 DIAGNOSIS — R3915 Urgency of urination: Secondary | ICD-10-CM | POA: Diagnosis not present

## 2022-05-05 DIAGNOSIS — Z9889 Other specified postprocedural states: Secondary | ICD-10-CM

## 2022-05-05 LAB — POCT URINALYSIS DIPSTICK
Bilirubin, UA: NEGATIVE
Blood, UA: NEGATIVE
Glucose, UA: NEGATIVE
Ketones, UA: NEGATIVE
Leukocytes, UA: NEGATIVE
Nitrite, UA: NEGATIVE
Protein, UA: NEGATIVE
Spec Grav, UA: 1.015 (ref 1.010–1.025)
Urobilinogen, UA: 0.2 E.U./dL
pH, UA: 6 (ref 5.0–8.0)

## 2022-05-05 NOTE — Progress Notes (Signed)
HPI:      [name] is a 37 y.o. G0P0 who LMP was Patient's last menstrual period was 12/15/2021 (exact date).  Subjective:   She presents today 4 months after hysterectomy.  She states that 2 weeks ago she had a UTI which was treated with Cipro.  She got mostly better from it but then developed more pelvic pressure and was found to have BV.  She is not completing her course of Flagyl for BV and feels better but not completely better.  She still has some pelvic pressure symptoms.  Prior to the UTI and BV she was doing fine and having no issues subsequent to her hysterectomy.  She has resumed normal sexual activity is having normal bowel movements and urinating without problem with the exception of the last 2 weeks.    Hx: The following portions of the patient's history were reviewed and updated as appropriate:             She  has a past medical history of Abdominal pain, Anxiety, Depression, Endometriosis, GERD (gastroesophageal reflux disease), Headache, Hemorrhoid, History of kidney stones, Migraine, PONV (postoperative nausea and vomiting), and Ulcerative colitis. She does not have any pertinent problems on file. She  has a past surgical history that includes Bunionectomy (08/2008); Tonsillectomy; Dilation and curettage of uterus (2013); Appendectomy (2013); Pelvic laparoscopy (2009); Colonoscopy (2014); Intrauterine device insertion (06/24/2011); Diagnostic laparoscopy; Hernia repair (1989); Chromopertubation (N/A, 06/30/2014); laparoscopy (N/A, 06/30/2014); IUD removal (N/A, 06/30/2014); Breast cyst excision (Right, 2017); Laparoscopic vaginal hysterectomy with salpingectomy (Right, 12/27/2021); and IUD removal (12/27/2021). Her family history includes Brain cancer in her maternal grandfather; Breast cancer (age of onset: 57) in her mother; Cancer in her mother and paternal grandmother; Diabetes in her maternal grandmother, mother, and paternal grandmother. She  reports that she has never  smoked. She has never used smokeless tobacco. She reports current alcohol use. She reports that she does not use drugs. She has a current medication list which includes the following prescription(s): budesonide-formoterol, cyclobenzaprine, eletriptan, emgality, metronidazole, ondansetron, phentermine, topiramate, trazodone, triamcinolone acetonide, and venlafaxine xr. She is allergic to sulfa antibiotics.       Review of Systems:  Review of Systems  Constitutional: Denied constitutional symptoms, night sweats, recent illness, fatigue, fever, insomnia and weight loss.  Eyes: Denied eye symptoms, eye pain, photophobia, vision change and visual disturbance.  Ears/Nose/Throat/Neck: Denied ear, nose, throat or neck symptoms, hearing loss, nasal discharge, sinus congestion and sore throat.  Cardiovascular: Denied cardiovascular symptoms, arrhythmia, chest pain/pressure, edema, exercise intolerance, orthopnea and palpitations.  Respiratory: Denied pulmonary symptoms, asthma, pleuritic pain, productive sputum, cough, dyspnea and wheezing.  Gastrointestinal: Denied, gastro-esophageal reflux, melena, nausea and vomiting.  Genitourinary: See HPI for additional information.  Musculoskeletal: Denied musculoskeletal symptoms, stiffness, swelling, muscle weakness and myalgia.  Dermatologic: Denied dermatology symptoms, rash and scar.  Neurologic: Denied neurology symptoms, dizziness, headache, neck pain and syncope.  Psychiatric: Denied psychiatric symptoms, anxiety and depression.  Endocrine: Denied endocrine symptoms including hot flashes and night sweats.   Meds:   Current Outpatient Medications on File Prior to Visit  Medication Sig Dispense Refill   budesonide-formoterol (SYMBICORT) 160-4.5 MCG/ACT inhaler Inhale 2 puffs into the lungs 2 (two) times daily. 1 each 0   cyclobenzaprine (FLEXERIL) 5 MG tablet Take 1 tablet (5 mg total) by mouth 3 (three) times daily as needed for muscle spasms. 30 tablet  1   eletriptan (RELPAX) 40 MG tablet Take 1 tablet (40 mg total) by mouth as needed  for migraine or headache. May repeat in 2 hours if headache persists or recurs. 10 tablet 5   Galcanezumab-gnlm (EMGALITY) 120 MG/ML SOAJ Inject 120 mg into the skin every 30 (thirty) days. 1.12 mL 11   metroNIDAZOLE (FLAGYL) 500 MG tablet Take 1 tablet (500 mg total) by mouth 2 (two) times daily. 14 tablet 0   ondansetron (ZOFRAN-ODT) 4 MG disintegrating tablet Take 1 tablet (4 mg total) by mouth every 8 (eight) hours as needed. 20 tablet 3   phentermine (ADIPEX-P) 37.5 MG tablet Take 37.5 mg by mouth daily before breakfast.     topiramate (TOPAMAX) 100 MG tablet Take 1 tablet (100 mg total) by mouth daily. 90 tablet 1   traZODone (DESYREL) 50 MG tablet TAKE 1/2 TO 1 TABLET BY MOUTH AT BEDTIME AS NEEDED FOR SLEEP 90 tablet 1   Triamcinolone Acetonide (NASACORT ALLERGY 24HR NA) Place into the nose.     venlafaxine XR (EFFEXOR-XR) 150 MG 24 hr capsule TAKE 1 CAPSULE(150 MG) BY MOUTH DAILY WITH BREAKFAST 90 capsule 0   No current facility-administered medications on file prior to visit.      Objective:     Vitals:   05/05/22 1501  BP: 114/75  Pulse: 66   Filed Weights   05/05/22 1501  Weight: 142 lb 14.4 oz (64.8 kg)              Urine dip today negative          Assessment:    G0P0 Patient Active Problem List   Diagnosis Date Noted   Greater trochanteric bursitis of right hip 03/15/2022   Acute left flank pain 10/25/2021   Depression, recurrent 03/06/2018   Ulcerative colitis 10/05/2017   Migraine without aura and without status migrainosus, not intractable 05/17/2016   Seasonal allergic rhinitis 05/17/2016   Interstitial cystitis 09/04/2014   Endometriosis 06/30/2014     1. Postoperative state   2. Urinary urgency     I believe the patient is probably fine she probably no longer has BV or UTI.  Because she still has some pelvic pressure symptoms I will send her urine for culture just  to be sure.   Plan:            1.  Urine for culture  2.  Patient to contact us if she continues to have pelvic pressure or BV symptoms. Orders Orders Placed This Encounter  Procedures   POCT urinalysis dipstick    No orders of the defined types were placed in this encounter.     F/U  Return in about 1 year (around 05/05/2023) for Annual Physical. I spent 18 minutes involved in the care of this patient preparing to see the patient by obtaining and reviewing her medical history (including labs, imaging tests and prior procedures), documenting clinical information in the electronic health record (EHR), counseling and coordinating care plans, writing and sending prescriptions, ordering tests or procedures and in direct communicating with the patient and medical staff discussing pertinent items from her history and physical exam.  Elonda Husky, M.D. 05/05/2022 3:29 PM

## 2022-05-05 NOTE — Progress Notes (Signed)
Patient presents for 4 month postop follow-up following hysterectomy. She states after recent UTI treatment still feeling urinary urgency. Denies bleeding or pain at this time.

## 2022-05-10 ENCOUNTER — Encounter: Payer: 59 | Admitting: Family Medicine

## 2022-05-23 ENCOUNTER — Ambulatory Visit (INDEPENDENT_AMBULATORY_CARE_PROVIDER_SITE_OTHER): Payer: 59

## 2022-05-23 VITALS — BP 106/81 | HR 84 | Wt 143.2 lb

## 2022-05-23 DIAGNOSIS — R399 Unspecified symptoms and signs involving the genitourinary system: Secondary | ICD-10-CM | POA: Diagnosis not present

## 2022-05-23 LAB — POCT URINALYSIS DIPSTICK
Bilirubin, UA: NEGATIVE
Blood, UA: NEGATIVE
Glucose, UA: NEGATIVE
Ketones, UA: NEGATIVE
Leukocytes, UA: NEGATIVE
Nitrite, UA: NEGATIVE
Protein, UA: NEGATIVE
Spec Grav, UA: 1.015
Urobilinogen, UA: 1 U/dL
pH, UA: 7

## 2022-05-23 NOTE — Patient Instructions (Addendum)
Urinary Tract Infection, Adult  A urinary tract infection (UTI) is an infection of any part of the urinary tract. The urinary tract includes the kidneys, ureters, bladder, and urethra. These organs make, store, and get rid of urine in the body. An upper UTI affects the ureters and kidneys. A lower UTI affects the bladder and urethra. What are the causes? Most urinary tract infections are caused by bacteria in your genital area around your urethra, where urine leaves your body. These bacteria grow and cause inflammation of your urinary tract. What increases the risk? You are more likely to develop this condition if: You have a urinary catheter that stays in place. You are not able to control when you urinate or have a bowel movement (incontinence). You are female and you: Use a spermicide or diaphragm for birth control. Have low estrogen levels. Are pregnant. You have certain genes that increase your risk. You are sexually active. You take antibiotic medicines. You have a condition that causes your flow of urine to slow down, such as: An enlarged prostate, if you are female. Blockage in your urethra. A kidney stone. A nerve condition that affects your bladder control (neurogenic bladder). Not getting enough to drink, or not urinating often. You have certain medical conditions, such as: Diabetes. A weak disease-fighting system (immunesystem). Sickle cell disease. Gout. Spinal cord injury. What are the signs or symptoms? Symptoms of this condition include: Needing to urinate right away (urgency). Frequent urination. This may include small amounts of urine each time you urinate. Pain or burning with urination. Blood in the urine. Urine that smells bad or unusual. Trouble urinating. Cloudy urine. Vaginal discharge, if you are female. Pain in the abdomen or the lower back. You may also have: Vomiting or a decreased appetite. Confusion. Irritability or tiredness. A fever or  chills. Diarrhea. The first symptom in older adults may be confusion. In some cases, they may not have any symptoms until the infection has worsened. How is this diagnosed? This condition is diagnosed based on your medical history and a physical exam. You may also have other tests, including: Urine tests. Blood tests. Tests for STIs (sexually transmitted infections). If you have had more than one UTI, a cystoscopy or imaging studies may be done to determine the cause of the infections. How is this treated? Treatment for this condition includes: Antibiotic medicine. Over-the-counter medicines to treat discomfort. Drinking enough water to stay hydrated. If you have frequent infections or have other conditions such as a kidney stone, you may need to see a health care provider who specializes in the urinary tract (urologist). In rare cases, urinary tract infections can cause sepsis. Sepsis is a life-threatening condition that occurs when the body responds to an infection. Sepsis is treated in the hospital with IV antibiotics, fluids, and other medicines. Follow these instructions at home:  Medicines Take over-the-counter and prescription medicines only as told by your health care provider. If you were prescribed an antibiotic medicine, take it as told by your health care provider. Do not stop using the antibiotic even if you start to feel better. General instructions Make sure you: Empty your bladder often and completely. Do not hold urine for long periods of time. Empty your bladder after sex. Wipe from front to back after urinating or having a bowel movement if you are female. Use each tissue only one time when you wipe. Drink enough fluid to keep your urine pale yellow. Keep all follow-up visits. This is important. Contact a health   care provider if: Your symptoms do not get better after 1-2 days. Your symptoms go away and then return. Get help right away if: You have severe pain in  your back or your lower abdomen. You have a fever or chills. You have nausea or vomiting. Summary A urinary tract infection (UTI) is an infection of any part of the urinary tract, which includes the kidneys, ureters, bladder, and urethra. Most urinary tract infections are caused by bacteria in your genital area. Treatment for this condition often includes antibiotic medicines. If you were prescribed an antibiotic medicine, take it as told by your health care provider. Do not stop using the antibiotic even if you start to feel better. Keep all follow-up visits. This is important. This information is not intended to replace advice given to you by your health care provider. Make sure you discuss any questions you have with your health care provider. Document Revised: 08/16/2019 Document Reviewed: 08/16/2019 Elsevier Patient Education  2023 [employer]  

## 2022-05-23 NOTE — Progress Notes (Signed)
    NURSE VISIT NOTE  Subjective:    Patient ID: Bethany Harrison, female    DOB: Apr 05, 1985, 37 y.o.   MRN: [id number]       HPI  Patient is a 37 y.o. G0P0 female who presents for dysuria for 2 days.  Patient denies flank pain and abdominal pain.  Patient does not have a history of recurrent UTI.  Patient does not have a history of pyelonephritis.    Objective:    BP 106/81   Pulse 84   Wt 143 lb 3.2 oz (65 kg)   LMP 12/15/2021 (Exact Date)   BMI 23.11 kg/m    Lab Review  Results for orders placed or performed in visit on 05/23/22  POCT Urinalysis Dipstick  Result Value Ref Range   Color, UA     Clarity, UA     Glucose, UA Negative Negative   Bilirubin, UA Negative    Ketones, UA Negative    Spec Grav, UA 1.015 1.010 - 1.025   Blood, UA Negative    pH, UA 7.0 5.0 - 8.0   Protein, UA Negative Negative   Urobilinogen, UA 1.0 0.2 or 1.0 E.U./dL   Nitrite, UA Negative    Leukocytes, UA Negative Negative   Appearance     Odor      Assessment:   1. UTI symptoms      Plan:   Urine Culture Sent.    Burtis Junes, CMA

## 2022-05-25 LAB — URINE CULTURE

## 2022-05-26 ENCOUNTER — Other Ambulatory Visit: Payer: Self-pay

## 2022-05-26 DIAGNOSIS — N390 Urinary tract infection, site not specified: Secondary | ICD-10-CM

## 2022-05-26 MED ORDER — NITROFURANTOIN MONOHYD MACRO 100 MG PO CAPS
100.0000 mg | ORAL_CAPSULE | Freq: Two times a day (BID) | ORAL | 0 refills | Status: DC
Start: 2022-05-26 — End: 2022-05-27

## 2022-05-27 ENCOUNTER — Other Ambulatory Visit: Payer: Self-pay | Admitting: Advanced Practice Midwife

## 2022-05-27 ENCOUNTER — Other Ambulatory Visit: Payer: Self-pay

## 2022-05-27 DIAGNOSIS — N390 Urinary tract infection, site not specified: Secondary | ICD-10-CM

## 2022-05-27 MED ORDER — NITROFURANTOIN MONOHYD MACRO 100 MG PO CAPS
100.0000 mg | ORAL_CAPSULE | Freq: Two times a day (BID) | ORAL | 0 refills | Status: DC
Start: 2022-05-27 — End: 2022-07-01

## 2022-05-31 ENCOUNTER — Encounter: Payer: Self-pay | Admitting: Neurology

## 2022-05-31 ENCOUNTER — Ambulatory Visit (INDEPENDENT_AMBULATORY_CARE_PROVIDER_SITE_OTHER): Payer: 59 | Admitting: Neurology

## 2022-05-31 ENCOUNTER — Encounter: Payer: Self-pay | Admitting: Family Medicine

## 2022-05-31 ENCOUNTER — Ambulatory Visit (INDEPENDENT_AMBULATORY_CARE_PROVIDER_SITE_OTHER): Payer: 59 | Admitting: Family Medicine

## 2022-05-31 VITALS — BP 116/81 | HR 88 | Temp 98.1°F | Ht 67.0 in | Wt 139.0 lb

## 2022-05-31 VITALS — BP 109/74 | HR 95 | Ht 67.0 in | Wt 138.9 lb

## 2022-05-31 DIAGNOSIS — N390 Urinary tract infection, site not specified: Secondary | ICD-10-CM

## 2022-05-31 DIAGNOSIS — K5180 Other ulcerative colitis without complications: Secondary | ICD-10-CM

## 2022-05-31 DIAGNOSIS — G43009 Migraine without aura, not intractable, without status migrainosus: Secondary | ICD-10-CM

## 2022-05-31 DIAGNOSIS — F339 Major depressive disorder, recurrent, unspecified: Secondary | ICD-10-CM

## 2022-05-31 DIAGNOSIS — Z0000 Encounter for general adult medical examination without abnormal findings: Secondary | ICD-10-CM | POA: Diagnosis not present

## 2022-05-31 DIAGNOSIS — R233 Spontaneous ecchymoses: Secondary | ICD-10-CM | POA: Diagnosis not present

## 2022-05-31 LAB — MICROSCOPIC EXAMINATION: WBC, UA: NONE SEEN /hpf (ref 0–5)

## 2022-05-31 LAB — URINALYSIS, ROUTINE W REFLEX MICROSCOPIC: Specific Gravity, UA: 1.015 (ref 1.005–1.030)

## 2022-05-31 MED ORDER — VENLAFAXINE HCL ER 150 MG PO CP24: ORAL_CAPSULE | ORAL | 0 refills | Status: DC

## 2022-05-31 MED ORDER — ZOLMITRIPTAN 5 MG NA SOLN: 1.0000 | NASAL | 5 refills | Status: DC | PRN

## 2022-05-31 MED ORDER — EMGALITY 120 MG/ML ~~LOC~~ SOAJ: 120.0000 mg | SUBCUTANEOUS | 11 refills | Status: DC

## 2022-05-31 MED ORDER — BUDESONIDE-FORMOTEROL FUMARATE 160-4.5 MCG/ACT IN AERO: 2.0000 | INHALATION_SPRAY | Freq: Two times a day (BID) | RESPIRATORY_TRACT | 2 refills | Status: DC

## 2022-05-31 MED ORDER — TRAZODONE HCL 50 MG PO TABS: ORAL_TABLET | ORAL | 1 refills | Status: DC

## 2022-05-31 MED ORDER — VENLAFAXINE HCL ER 75 MG PO CP24
ORAL_CAPSULE | ORAL | 0 refills | Status: DC
Start: 2022-05-31 — End: 2022-07-01

## 2022-05-31 MED ORDER — TOPIRAMATE 100 MG PO TABS: 100.0000 mg | ORAL_TABLET | Freq: Every day | ORAL | 3 refills | Status: DC

## 2022-05-31 NOTE — Progress Notes (Unsigned)
BP 116/81   Pulse 88   Temp 98.1 F (36.7 C) (Oral)   Ht 5\' 7"  (1.702 m)   Wt 139 lb (63 kg)   LMP 12/15/2021 (Exact Date)   SpO2 98%   BMI 21.77 kg/m    Subjective:    Patient ID: Bethany Harrison, female    DOB: [birth year], 37 y.o.   MRN: [id number]  HPI: Bethany Harrison is a 36 y.o. female presenting on 05/31/2022 for comprehensive medical examination. Current medical complaints include:  ANXIETY/DEPRESSION- has been seeing a counselor, seems to be doing OK Duration:{Blank single:19197::"controlled","uncontrolled","better","worse","exacerbated","stable"} Anxious mood: {Blank single:19197::"yes","no"}  Excessive worrying: {Blank single:19197::"yes","no"} Irritability: {Blank single:19197::"yes","no"}  Sweating: {Blank single:19197::"yes","no"} Nausea: {Blank single:19197::"yes","no"} Palpitations:{Blank single:19197::"yes","no"} Hyperventilation: {Blank single:19197::"yes","no"} Panic attacks: {Blank single:19197::"yes","no"} Agoraphobia: {Blank single:19197::"yes","no"}  Obscessions/compulsions: {Blank single:19197::"yes","no"} Depressed mood: {Blank single:19197::"yes","no"}    03/15/2022    9:31 AM 10/25/2021   11:34 AM 08/02/2021    2:41 PM 05/06/2021    4:05 PM 03/18/2021    4:12 PM  Depression screen PHQ 2/9  Decreased Interest 1 1 0 1 1  Down, Depressed, Hopeless 2 2 1 1 2   PHQ - 2 Score 3 3 1 2 3   Altered sleeping 2 1 2 1 3   Tired, decreased energy 2 1 0 0 2  Change in appetite 1 1 0 0 0  Feeling bad or failure about yourself  0 1 0 0 1  Trouble concentrating 0 1 1 1 1   Moving slowly or fidgety/restless 2 1 1  0 1  Suicidal thoughts 0 0 0 0 0  PHQ-9 Score 10 9 5 4 11   Difficult doing work/chores Somewhat difficult Somewhat difficult Somewhat difficult Not difficult at all Somewhat difficult   Anhedonia: {Blank single:19197::"yes","no"} Weight changes: {Blank single:19197::"yes","no"} Insomnia: {Blank single:19197::"yes","no"} {Blank single:19197::"hard to fall  asleep","hard to stay asleep"}  Hypersomnia: {Blank single:19197::"yes","no"} Fatigue/loss of energy: {Blank single:19197::"yes","no"} Feelings of worthlessness: {Blank single:19197::"yes","no"} Feelings of guilt: {Blank single:19197::"yes","no"} Impaired concentration/indecisiveness: {Blank single:19197::"yes","no"} Suicidal ideations: {Blank single:19197::"yes","no"}  Crying spells: {Blank single:19197::"yes","no"} Recent Stressors/Life Changes: {Blank single:19197::"yes","no"}   Relationship problems: {Blank single:19197::"yes","no"}   Family stress: {Blank single:19197::"yes","no"}     Financial stress: {Blank single:19197::"yes","no"}    Job stress: {Blank single:19197::"yes","no"}    Recent death/loss: {Blank single:19197::"yes","no"}  She currently lives with: husband Menopausal Symptoms: no  Depression Screen done today and results listed below:     03/15/2022    9:31 AM 10/25/2021   11:34 AM 08/02/2021    2:41 PM 05/06/2021    4:05 PM 03/18/2021    4:12 PM  Depression screen PHQ 2/9  Decreased Interest 1 1 0 1 1  Down, Depressed, Hopeless 2 2 1 1 2   PHQ - 2 Score 3 3 1 2 3   Altered sleeping 2 1 2 1 3   Tired, decreased energy 2 1 0 0 2  Change in appetite 1 1 0 0 0  Feeling bad or failure about yourself  0 1 0 0 1  Trouble concentrating 0 1 1 1 1   Moving slowly or fidgety/restless 2 1 1  0 1  Suicidal thoughts 0 0 0 0 0  PHQ-9 Score 10 9 5 4 11   Difficult doing work/chores Somewhat difficult Somewhat difficult Somewhat difficult Not difficult at all Somewhat difficult    Past Medical History:  Past Medical History:  Diagnosis Date   Abdominal pain    Anxiety    Depression    Endometriosis    GERD (gastroesophageal reflux disease)    Headache  Hemorrhoid    History of kidney stones    Migraine    PONV (postoperative nausea and vomiting)    Ulcerative colitis [facility])     Surgical History:  Past Surgical History:  Procedure Laterality Date   APPENDECTOMY   2013   with laparoscopy @ [place]   BREAST CYST EXCISION Right 2017   sebaceous cyst removed   BUNIONECTOMY  08/2008   CHROMOPERTUBATION N/A 06/30/2014   Procedure: CHROMOPERTUBATION;  Surgeon: Herold Harms, MD;  Location: [facility] OR[facility];  Service: Gynecology;  Laterality: N/A;   COLONOSCOPY  2014   DIAGNOSTIC LAPAROSCOPY     DILATION AND CURETTAGE OF UTERUS  2013   polyp   HERNIA REPAIR  1989   umbilical   INTRAUTERINE DEVICE INSERTION  06/24/2011   Mirena IUD   IUD REMOVAL N/A 06/30/2014   Procedure: INTRAUTERINE DEVICE (IUD) REMOVAL;  Surgeon: Herold Harms, MD;  Location: [facility];  Service: Gynecology;  Laterality: N/A;   IUD REMOVAL  12/27/2021   Procedure: INTRAUTERINE DEVICE (IUD) REMOVAL;  Surgeon: Linzie Collin, MD;  Location: [facility];  Service: Gynecology;;   LAPAROSCOPIC VAGINAL HYSTERECTOMY WITH SALPINGECTOMY Right 12/27/2021   Procedure: LAPAROSCOPIC ASSISTED VAGINAL HYSTERECTOMY WITH SALPINGECTOMY;  Surgeon: Linzie Collin, MD;  Location: [facility];  Service: Gynecology;  Laterality: Right;   LAPAROSCOPY N/A 06/30/2014   Procedure:  with excision of endoemtriosis and biopsy;  Surgeon: Herold Harms, MD;  Location: [facility];  Service: Gynecology;  Laterality: N/A;   PELVIC LAPAROSCOPY  2009   times 2   TONSILLECTOMY      Medications:  Current Outpatient Medications on File Prior to Visit  Medication Sig   budesonide-formoterol (SYMBICORT) 160-4.5 MCG/ACT inhaler Inhale 2 puffs into the lungs 2 (two) times daily.   cyclobenzaprine (FLEXERIL) 5 MG tablet Take 1 tablet (5 mg total) by mouth 3 (three) times daily as needed for muscle spasms.   Galcanezumab-gnlm (EMGALITY) 120 MG/ML SOAJ Inject 120 mg into the skin every 30 (thirty) days.   nitrofurantoin, macrocrystal-monohydrate, (MACROBID) 100 MG capsule Take 1 capsule (100 mg total) by mouth 2 (two) times daily.   ondansetron (ZOFRAN-ODT) 4 MG disintegrating tablet Take 1 tablet (4 mg  total) by mouth every 8 (eight) hours as needed.   phentermine (ADIPEX-P) 37.5 MG tablet Take 37.5 mg by mouth daily before breakfast.   topiramate (TOPAMAX) 100 MG tablet Take 1 tablet (100 mg total) by mouth daily.   traZODone (DESYREL) 50 MG tablet TAKE 1/2 TO 1 TABLET BY MOUTH AT BEDTIME AS NEEDED FOR SLEEP   Triamcinolone Acetonide (NASACORT ALLERGY 24HR NA) Place into the nose.   venlafaxine XR (EFFEXOR-XR) 150 MG 24 hr capsule TAKE 1 CAPSULE(150 MG) BY MOUTH DAILY WITH BREAKFAST   No current facility-administered medications on file prior to visit.    Allergies:  Allergies  Allergen Reactions   Sulfa Antibiotics Nausea Only    Social History:  Social History   Socioeconomic History   Marital status: Married    Spouse name: Not on file   Number of children: 0   Years of education: Not on file   Highest education level: Bachelor's degree (e.g., BA, AB, BS)  Occupational History   Occupation: HR    Employer: [employer]  Tobacco Use   Smoking status: Never   Smokeless tobacco: Never  Vaping Use   Vaping Use: Never used  Substance and Sexual Activity   Alcohol use: Yes    Alcohol/week: 0.0 standard  drinks of alcohol    Comment: occas   Drug use: No   Sexual activity: Not Currently    Partners: Male    Birth control/protection: Surgical    Comment: hysterectomy  Other Topics Concern   Not on file  Social History Narrative   Lives with husband   Caffeine- soda maybe one a day   Social Determinants of Health   Financial Resource Strain: Low Risk  (04/16/2022)   Overall Financial Resource Strain (CARDIA)    Difficulty of Paying Living Expenses: Not hard at all  Food Insecurity: No Food Insecurity (04/16/2022)   Hunger Vital Sign    Worried About Running Out of Food in the Last Year: Never true    Ran Out of Food in the Last Year: Never true  Transportation Needs: No Transportation Needs (04/16/2022)   PRAPARE - Administrator, Civil Service  (Medical): No    Lack of Transportation (Non-Medical): No  Physical Activity: Insufficiently Active (04/16/2022)   Exercise Vital Sign    Days of Exercise per Week: 4 days    Minutes of Exercise per Session: 30 min  Stress: Stress Concern Present (04/16/2022)   [employer] of Occupational Health - Occupational Stress Questionnaire    Feeling of Stress : To some extent  Social Connections: Moderately Isolated (04/16/2022)   Social Connection and Isolation Panel [NHANES]    Frequency of Communication with Friends and Family: More than three times a week    Frequency of Social Gatherings with Friends and Family: Twice a week    Attends Religious Services: Never    Diplomatic Services operational officer: No    Attends Engineer, structural: Not on file    Marital Status: Married  Catering manager Violence: Not on file   Social History   Tobacco Use  Smoking Status Never  Smokeless Tobacco Never   Social History   Substance and Sexual Activity  Alcohol Use Yes   Alcohol/week: 0.0 standard drinks of alcohol   Comment: occas    Family History:  Family History  Problem Relation Age of Onset   Breast cancer Mother 38   Cancer Mother    Diabetes Mother    Diabetes Maternal Grandmother    Brain cancer Maternal Grandfather    Cancer Paternal Grandmother        colon   Diabetes Paternal Grandmother    Heart disease Neg Hx    Ovarian cancer Neg Hx     Past medical history, surgical history, medications, allergies, family history and social history reviewed with patient today and changes made to appropriate areas of the chart.   Review of Systems  Constitutional: Negative.   HENT: Negative.    Eyes:  Positive for blurred vision. Negative for double vision, photophobia, pain, discharge and redness.  Respiratory: Negative.    Cardiovascular: Negative.   Gastrointestinal:  Positive for constipation. Negative for abdominal pain, blood in stool, diarrhea, heartburn,  melena, nausea and vomiting.  Genitourinary:  Positive for dysuria. Negative for flank pain, frequency, hematuria and urgency.  Musculoskeletal: Negative.   Skin: Negative.   Neurological:  Positive for dizziness and headaches. Negative for tingling, tremors, sensory change, speech change, focal weakness, seizures, loss of consciousness and weakness.  Endo/Heme/Allergies: Negative.   Psychiatric/Behavioral:  Positive for depression. Negative for hallucinations, memory loss, substance abuse and suicidal ideas. The patient is nervous/anxious. The patient does not have insomnia.    All other ROS negative except what is listed above  and in the HPI.      Objective:    BP 116/81   Pulse 88   Temp 98.1 F (36.7 C) (Oral)   Ht 5\' 7"  (1.702 m)   Wt 139 lb (63 kg)   LMP 12/15/2021 (Exact Date)   SpO2 98%   BMI 21.77 kg/m   Wt Readings from Last 3 Encounters:  05/31/22 139 lb (63 kg)  05/23/22 143 lb 3.2 oz (65 kg)  05/05/22 142 lb 14.4 oz (64.8 kg)    Physical Exam  Results for orders placed or performed in visit on 05/23/22  Urine Culture   Specimen: Urine   UR  Result Value Ref Range   Urine Culture, Routine Final report (A)    Organism ID, Bacteria Escherichia coli (A)    Antimicrobial Susceptibility Comment   POCT Urinalysis Dipstick  Result Value Ref Range   Color, UA     Clarity, UA     Glucose, UA Negative Negative   Bilirubin, UA Negative    Ketones, UA Negative    Spec Grav, UA 1.015 1.010 - 1.025   Blood, UA Negative    pH, UA 7.0 5.0 - 8.0   Protein, UA Negative Negative   Urobilinogen, UA 1.0 0.2 or 1.0 E.U./dL   Nitrite, UA Negative    Leukocytes, UA Negative Negative   Appearance     Odor        Assessment & Plan:   Problem List Items Addressed This Visit   None Visit Diagnoses     Recurrent UTI    -  Primary   Relevant Orders   Ambulatory referral to Urology        Follow up plan: No follow-ups on file.   LABORATORY TESTING:  - Pap smear:  not applicable  IMMUNIZATIONS:   - Tdap: Tetanus vaccination status reviewed: last tetanus booster within 10 years. - Influenza: Up to date - Pneumovax: Not applicable - Prevnar: Not applicable - COVID: Up to date - HPV: Not applicable   PATIENT COUNSELING:   Advised to take 1 mg of folate supplement per day if capable of pregnancy.   Sexuality: Discussed sexually transmitted diseases, partner selection, use of condoms, avoidance of unintended pregnancy  and contraceptive alternatives.   Advised to avoid cigarette smoking.  I discussed with the patient that most people either abstain from alcohol or drink within safe limits (<=14/week and <=4 drinks/occasion for males, <=7/weeks and <= 3 drinks/occasion for females) and that the risk for alcohol disorders and other health effects rises proportionally with the number of drinks per week and how often a drinker exceeds daily limits.  Discussed cessation/primary prevention of drug use and availability of treatment for abuse.   Diet: Encouraged to adjust caloric intake to maintain  or achieve ideal body weight, to reduce intake of dietary saturated fat and total fat, to limit sodium intake by avoiding high sodium foods and not adding table salt, and to maintain adequate dietary potassium and calcium preferably from fresh fruits, vegetables, and low-fat dairy products.    stressed the importance of regular exercise  Injury prevention: Discussed safety belts, safety helmets, smoke detector, smoking near bedding or upholstery.   Dental health: Discussed importance of regular tooth brushing, flossing, and dental visits.    NEXT PREVENTATIVE PHYSICAL DUE IN 1 YEAR. No follow-ups on file.

## 2022-05-31 NOTE — Progress Notes (Signed)
Patient: Bethany Harrison Date of Birth: 24-Nov-1985  Reason for Visit: Follow up History from: Patient Primary Neurologist: Dr. Delena Bali  ASSESSMENT AND PLAN 37 y.o. year old female   1.  Chronic migraine headaches with vertigo symptoms -Continue Emgality for migraine prevention, also on Topamax 100 mg daily, on Effexor from another provider -Try Zomig 5 mg nasal spray for migraine treatment (prior insurance wouldn't cover, but has a new plan), has meclizine if needed for dizziness -Previously tried and failed: Relpax (insurance would not cover), Topamax, Effexor, amitriptyline, Ajovy, Nurtec, Imitrex, Maxalt, Fioricet, Zofran -Follow-up in 1 year or sooner if needed  HISTORY Copied 08/25/21 Dr. Delena Bali: The patient presents for evaluation of headaches and dizziness. She has had migraines since her early 1s. She has had dizziness for the past 6-12 months, but it has worsened in the past 3 months. Currently she has episodes of dizziness twice per week. Describes this as both a sensation of lightheadedness and like her body is moving. She will feel uncoordinated and trip over her feet. She will have associated fullness and ringing in her ears. Typically she will get a dizzy spell, then develop a migraine. She will have some form of a headache >50% of the time she has dizziness. Currently she has 1-2 headaches per week.   She was started on meclizine which helps but takes several hours to start working. Has tried multiple migraine medications previously. She is not sure if they were helpful but notes she did not have vertigo at the time she was using them.   Headache History: Onset: early 64s Triggers: menstrual cycle Aura: none Location: unilateral Quality/Description: throbbing Associated Symptoms:             Photophobia: yes             Phonophobia: yes             Nausea: yes Vomiting: no Other symptoms: vertigo Worse with activity?: yes Duration of headaches: several hours   Headache  days per month: 8 Headache free days per month: 22   Current Treatment: Abortive Dramamine meclizine   Preventative Topamax 100 mg QHS Effexor 150 mg daily   Prior Therapies                                 Topamax 100 mg QHS Effexor amitriptyline Emgality Ajovy Nurtec 75 mg - lack of efficacy Imitrex - lack of efficacy Maxalt Fioricet Zofran   Update December 14, 2021 SS: This month is her 3rd Emgality, it is helping. Is due next week for injection. Yesterday dizziness, took meclizine with good benefit. Zomig didn't work but nasal spray was back ordered so tried tablets, eletriptan was expensive for 2 pills. Had Fioricet she took today, it went away. Over the last 3 weeks, only 1 spell needed rescue. Wants to hold off on MRI right now.   Update Jun 02, 2022 SS: remains on [place], had hysterectomy in December. Went to hormone doctor, her testosterone was low, this 1st month felt amazing, had no dizziness! The dizziness has come back somewhat, with mild headache. Has not had any severe headaches. Dizziness is 4-5 days a month, used to be 3 weeks a month. On Topamax 100 mg.  Overall doing a lot better.  REVIEW OF SYSTEMS: Out of a complete 14 system review of symptoms, the patient complains only of the following symptoms, and all other reviewed systems are  negative.  See HPI  ALLERGIES: Allergies  Allergen Reactions   Sulfa Antibiotics Nausea Only    HOME MEDICATIONS: Outpatient Medications Prior to Visit  Medication Sig Dispense Refill   ALPRAZolam (XANAX) 0.25 MG tablet Take 1 tablet (0.25 mg total) by mouth daily as needed. 15 tablet 0   budesonide-formoterol (SYMBICORT) 160-4.5 MCG/ACT inhaler Inhale 2 puffs into the lungs 2 (two) times daily. 3 each 2   cyclobenzaprine (FLEXERIL) 5 MG tablet Take 1 tablet (5 mg total) by mouth 3 (three) times daily as needed for muscle spasms. 30 tablet 1   nitrofurantoin, macrocrystal-monohydrate, (MACROBID) 100 MG capsule Take 1  capsule (100 mg total) by mouth 2 (two) times daily. 14 capsule 0   ondansetron (ZOFRAN-ODT) 4 MG disintegrating tablet Take 1 tablet (4 mg total) by mouth every 8 (eight) hours as needed. 20 tablet 3   phentermine (ADIPEX-P) 37.5 MG tablet Take 37.5 mg by mouth daily before breakfast.     traZODone (DESYREL) 50 MG tablet TAKE 1/2 TO 1 TABLET BY MOUTH AT BEDTIME AS NEEDED FOR SLEEP 90 tablet 1   Triamcinolone Acetonide (NASACORT ALLERGY 24HR NA) Place into the nose.     venlafaxine XR (EFFEXOR XR) 75 MG 24 hr capsule TAKE 1 CAPSULE(150 MG) BY MOUTH DAILY WITH BREAKFAST to be taken with the 150mg  for 225mg  daily (Patient taking differently: Take 75 mg by mouth daily with breakfast. TAKE 1 CAPSULE(150 MG) BY MOUTH DAILY WITH BREAKFAST to be taken with the 150mg  for 225mg  daily) 90 capsule 0   venlafaxine XR (EFFEXOR-XR) 150 MG 24 hr capsule TAKE 1 CAPSULE(150 MG) BY MOUTH DAILY WITH BREAKFAST to be taken with the 75mg  for 225mg  daily 90 capsule 0   Galcanezumab-gnlm (EMGALITY) 120 MG/ML SOAJ Inject 120 mg into the skin every 30 (thirty) days. 1.12 mL 11   topiramate (TOPAMAX) 100 MG tablet Take 1 tablet (100 mg total) by mouth daily. 90 tablet 1   No facility-administered medications prior to visit.    PAST MEDICAL HISTORY: Past Medical History:  Diagnosis Date   Abdominal pain    Anxiety    Depression    Endometriosis    GERD (gastroesophageal reflux disease)    Headache    Hemorrhoid    History of kidney stones    Migraine    PONV (postoperative nausea and vomiting)    Ulcerative colitis (HCC)     PAST SURGICAL HISTORY: Past Surgical History:  Procedure Laterality Date   APPENDECTOMY  2013   with laparoscopy @ [place]   BREAST CYST EXCISION Right 2017   sebaceous cyst removed   BUNIONECTOMY  08/2008   CHROMOPERTUBATION N/A 06/30/2014   Procedure: CHROMOPERTUBATION;  Surgeon: Herold Harms, MD;  Location: [facility];  Service: Gynecology;  Laterality: N/A;   COLONOSCOPY   2014   DIAGNOSTIC LAPAROSCOPY     DILATION AND CURETTAGE OF UTERUS  2013   polyp   HERNIA REPAIR  1989   umbilical   INTRAUTERINE DEVICE INSERTION  06/24/2011   Mirena IUD   IUD REMOVAL N/A 06/30/2014   Procedure: INTRAUTERINE DEVICE (IUD) REMOVAL;  Surgeon: Herold Harms, MD;  Location: [facility];  Service: Gynecology;  Laterality: N/A;   IUD REMOVAL  12/27/2021   Procedure: INTRAUTERINE DEVICE (IUD) REMOVAL;  Surgeon: Linzie Collin, MD;  Location: [facility];  Service: Gynecology;;   LAPAROSCOPIC VAGINAL HYSTERECTOMY WITH SALPINGECTOMY Right 12/27/2021   Procedure: LAPAROSCOPIC ASSISTED VAGINAL HYSTERECTOMY WITH SALPINGECTOMY;  Surgeon: Linzie Collin, MD;  Location: [facility];  Service: Gynecology;  Laterality: Right;   LAPAROSCOPY N/A 06/30/2014   Procedure:  with excision of endoemtriosis and biopsy;  Surgeon: Herold Harms, MD;  Location: [facility];  Service: Gynecology;  Laterality: N/A;   PELVIC LAPAROSCOPY  2009   times 2   TONSILLECTOMY      FAMILY HISTORY: Family History  Problem Relation Age of Onset   Breast cancer Mother 58   Cancer Mother    Diabetes Mother    Diabetes Maternal Grandmother    Brain cancer Maternal Grandfather    Cancer Paternal Grandmother        colon   Diabetes Paternal Grandmother    Heart disease Neg Hx    Ovarian cancer Neg Hx     SOCIAL HISTORY: Social History   Socioeconomic History   Marital status: Married    Spouse name: Not on file   Number of children: 0   Years of education: Not on file   Highest education level: Bachelor's degree (e.g., BA, AB, BS)  Occupational History   Occupation: HR    Employer: [employer]  Tobacco Use   Smoking status: Never   Smokeless tobacco: Never  Vaping Use   Vaping Use: Never used  Substance and Sexual Activity   Alcohol use: Yes    Alcohol/week: 0.0 standard drinks of alcohol    Comment: occas   Drug use: No   Sexual activity: Yes    Partners: Male     Birth control/protection: Surgical    Comment: hysterectomy  Other Topics Concern   Not on file  Social History Narrative   Lives with husband   Caffeine- soda maybe one a day   Social Determinants of Health   Financial Resource Strain: Low Risk  (04/16/2022)   Overall Financial Resource Strain (CARDIA)    Difficulty of Paying Living Expenses: Not hard at all  Food Insecurity: No Food Insecurity (04/16/2022)   Hunger Vital Sign    Worried About Running Out of Food in the Last Year: Never true    Ran Out of Food in the Last Year: Never true  Transportation Needs: No Transportation Needs (04/16/2022)   PRAPARE - Administrator, Civil Service (Medical): No    Lack of Transportation (Non-Medical): No  Physical Activity: Insufficiently Active (04/16/2022)   Exercise Vital Sign    Days of Exercise per Week: 4 days    Minutes of Exercise per Session: 30 min  Stress: Stress Concern Present (04/16/2022)   [employer] of Occupational Health - Occupational Stress Questionnaire    Feeling of Stress : To some extent  Social Connections: Moderately Isolated (04/16/2022)   Social Connection and Isolation Panel [NHANES]    Frequency of Communication with Friends and Family: More than three times a week    Frequency of Social Gatherings with Friends and Family: Twice a week    Attends Religious Services: Never    Database administrator or Organizations: No    Attends Engineer, structural: Not on file    Marital Status: Married  Catering manager Violence: Not on file    PHYSICAL EXAM  Vitals:   05/31/22 1125  BP: 109/74  Pulse: 95  Weight: 138 lb 14.2 oz (63 kg)  Height: 5\' 7"  (1.702 m)    Body mass index is 21.75 kg/m.  Generalized: Well developed, in no acute distress  Neurological examination  Mentation: Alert oriented to time, place, history taking. Follows all commands  speech and language fluent Cranial nerve II-XII: Pupils were equal round reactive to  light. Extraocular movements were full, visual field were full on confrontational test. Facial sensation and strength were normal. Head turning and shoulder shrug  were normal and symmetric. Motor: The motor testing reveals 5 over 5 strength of all 4 extremities. Good symmetric motor tone is noted throughout.  Sensory: Sensory testing is intact to soft touch on all 4 extremities. No evidence of extinction is noted.  Coordination: Cerebellar testing reveals good finger-nose-finger and heel-to-shin bilaterally.  Gait and station: Gait is normal.  Reflexes: Deep tendon reflexes are symmetric and normal bilaterally.   DIAGNOSTIC DATA (LABS, IMAGING, TESTING) - I reviewed patient records, labs, notes, testing and imaging myself where available.  Lab Results  Component Value Date   WBC 9.5 08/02/2021   HGB 13.4 08/02/2021   HCT 40.1 08/02/2021   MCV 98 (H) 08/02/2021   PLT 373 08/02/2021      Component Value Date/Time   NA 141 08/02/2021 1509   K 3.5 08/02/2021 1509   CL 101 08/02/2021 1509   CO2 25 08/02/2021 1509   GLUCOSE 79 08/02/2021 1509   BUN 23 (H) 08/02/2021 1509   CREATININE 0.79 08/02/2021 1509   CALCIUM 9.9 08/02/2021 1509   PROT 6.8 08/02/2021 1509   ALBUMIN 4.5 08/02/2021 1509   AST 18 08/02/2021 1509   ALT 14 08/02/2021 1509   ALKPHOS 53 08/02/2021 1509   BILITOT 0.5 08/02/2021 1509   Lab Results  Component Value Date   CHOL 193 02/17/2021   HDL 47 02/17/2021   LDLCALC 133 (H) 02/17/2021   TRIG 71 02/17/2021   No results found for: "HGBA1C" No results found for: "VITAMINB12" Lab Results  Component Value Date   TSH 1.260 08/02/2021    Margie Ege, AGNP-C, DNP 05/31/2022, 11:46 AM [facility] [address] [place], [place] [address] [phone]

## 2022-06-01 ENCOUNTER — Encounter: Payer: Self-pay | Admitting: Family Medicine

## 2022-06-01 LAB — COMPREHENSIVE METABOLIC PANEL
AST: 16 IU/L (ref 0–40)
Albumin/Globulin Ratio: 1.8 (ref 1.2–2.2)
Alkaline Phosphatase: 54 IU/L (ref 44–121)
BUN: 14 mg/dL (ref 6–20)
Creatinine, Ser: 0.82 mg/dL (ref 0.57–1.00)
Sodium: 140 mmol/L (ref 134–144)
Total Protein: 7.1 g/dL (ref 6.0–8.5)

## 2022-06-01 LAB — CBC WITH DIFFERENTIAL/PLATELET
Basophils Absolute: 0 10*3/uL (ref 0.0–0.2)
Hematocrit: 42.8 % (ref 34.0–46.6)
MCHC: 33.4 g/dL (ref 31.5–35.7)
Neutrophils: 61 %

## 2022-06-01 LAB — LIPID PANEL W/O CHOL/HDL RATIO: VLDL Cholesterol Cal: 11 mg/dL (ref 5–40)

## 2022-06-01 NOTE — Assessment & Plan Note (Signed)
Will increase her effexor to 225mg  and recheck 1 month. Call with any concerns. Continue to monitor.

## 2022-06-01 NOTE — Addendum Note (Signed)
Addended by: Dorcas Carrow on: 06/01/2022 01:08 PM   Modules accepted: Level of Service

## 2022-06-01 NOTE — Assessment & Plan Note (Signed)
Continue to follow with GI. Call with any concerns.  

## 2022-06-02 LAB — CBC WITH DIFFERENTIAL/PLATELET
Basos: 1 %
EOS (ABSOLUTE): 0.1 10*3/uL (ref 0.0–0.4)
Eos: 1 %
Hemoglobin: 14.3 g/dL (ref 11.1–15.9)
Immature Grans (Abs): 0 10*3/uL (ref 0.0–0.1)
Immature Granulocytes: 0 %
Lymphocytes Absolute: 1.8 10*3/uL (ref 0.7–3.1)
Lymphs: 30 %
MCH: 33.6 pg — ABNORMAL HIGH (ref 26.6–33.0)
MCV: 101 fL — ABNORMAL HIGH (ref 79–97)
Monocytes Absolute: 0.4 10*3/uL (ref 0.1–0.9)
Monocytes: 7 %
Neutrophils Absolute: 3.6 10*3/uL (ref 1.4–7.0)
Platelets: 353 10*3/uL (ref 150–450)
RBC: 4.26 x10E6/uL (ref 3.77–5.28)
RDW: 11.7 % (ref 11.7–15.4)
WBC: 6 10*3/uL (ref 3.4–10.8)

## 2022-06-02 LAB — COMPREHENSIVE METABOLIC PANEL
ALT: 13 IU/L (ref 0–32)
Albumin: 4.6 g/dL (ref 3.9–4.9)
BUN/Creatinine Ratio: 17 (ref 9–23)
Bilirubin Total: 0.5 mg/dL (ref 0.0–1.2)
CO2: 23 mmol/L (ref 20–29)
Calcium: 10.1 mg/dL (ref 8.7–10.2)
Chloride: 103 mmol/L (ref 96–106)
Globulin, Total: 2.5 g/dL (ref 1.5–4.5)
Glucose: 80 mg/dL (ref 70–99)
Potassium: 4.4 mmol/L (ref 3.5–5.2)
eGFR: 94 mL/min/{1.73_m2} (ref >59)

## 2022-06-02 LAB — LIPID PANEL W/O CHOL/HDL RATIO
Cholesterol, Total: 201 mg/dL — ABNORMAL HIGH (ref 100–199)
HDL: 60 mg/dL (ref >39)
LDL Chol Calc (NIH): 130 mg/dL — ABNORMAL HIGH (ref 0–99)
Triglycerides: 63 mg/dL (ref 0–149)

## 2022-06-02 LAB — VON WILLEBRAND PANEL
Factor VIII Activity: 138 % (ref 56–140)
Von Willebrand Ag: 138 % (ref 50–200)
Von Willebrand Factor: 91 % (ref 50–200)

## 2022-06-02 LAB — TSH: TSH: 1.98 u[IU]/mL (ref 0.450–4.500)

## 2022-06-02 LAB — URINE CULTURE: Organism ID, Bacteria: NO GROWTH

## 2022-06-02 LAB — COAG STUDIES INTERP REPORT

## 2022-06-17 ENCOUNTER — Ambulatory Visit: Payer: 59 | Admitting: Urology

## 2022-06-28 ENCOUNTER — Telehealth: Payer: 59 | Admitting: Family Medicine

## 2022-07-01 ENCOUNTER — Other Ambulatory Visit: Payer: Self-pay

## 2022-07-01 ENCOUNTER — Other Ambulatory Visit
Admission: RE | Admit: 2022-07-01 | Discharge: 2022-07-01 | Disposition: A | Discharge status: Home / Self Care | Payer: 59 | Attending: Urology | Admitting: Urology

## 2022-07-01 ENCOUNTER — Ambulatory Visit (INDEPENDENT_AMBULATORY_CARE_PROVIDER_SITE_OTHER): Payer: 59 | Admitting: Urology

## 2022-07-01 VITALS — BP 109/68 | HR 76 | Ht 67.0 in | Wt 136.1 lb

## 2022-07-01 DIAGNOSIS — N390 Urinary tract infection, site not specified: Secondary | ICD-10-CM

## 2022-07-01 DIAGNOSIS — Z87440 Personal history of urinary (tract) infections: Secondary | ICD-10-CM

## 2022-07-01 DIAGNOSIS — Z09 Encounter for follow-up examination after completed treatment for conditions other than malignant neoplasm: Secondary | ICD-10-CM

## 2022-07-01 LAB — URINALYSIS, COMPLETE (UACMP) WITH MICROSCOPIC
Bilirubin Urine: NEGATIVE
Glucose, UA: NEGATIVE mg/dL
Hgb urine dipstick: NEGATIVE
Ketones, ur: NEGATIVE mg/dL
Leukocytes,Ua: NEGATIVE
Nitrite: NEGATIVE
Protein, ur: NEGATIVE mg/dL
RBC / HPF: NONE SEEN RBC/hpf (ref 0–5)
Specific Gravity, Urine: 1.02 (ref 1.005–1.030)
WBC, UA: NONE SEEN WBC/hpf (ref 0–5)
pH: 7.5 (ref 5.0–8.0)

## 2022-07-01 LAB — BLADDER SCAN AMB NON-IMAGING: Scan Result: 0

## 2022-07-01 NOTE — Progress Notes (Signed)
Marcelle Overlie Plume,acting as a scribe for Vanna Scotland, MD.,have documented all relevant documentation on the behalf of Vanna Scotland, MD,as directed by  Vanna Scotland, MD while in the presence of Vanna Scotland, MD.  07/01/2022 9:57 AM   Bethany Harrison April 02, 1985 [id number]  Referring provider: Dorcas Carrow, DO [address] [place],  [place] [address]  Chief Complaint  Patient presents with   Establish Care   Recurrent UTI    HPI: 37 year-old female who is referred for further evaluation of recurrent UTI's.  S  he has had multiple urinalyses, most of which showed no evidence of infection. However, one urinalysis from 04/2022 was suspicious for Klebsiella infection. All other urinalyses and urine cultures have been negative, indicating an isolated UTI over the past year. Her most recent cross-sectional imaging from 2014 was unremarkable.  She reports that her first UTI occurred in April, treated initially by her PCP with a three-day antibiotic course, which did not resolve her symptoms. Subsequent testing revealed BV, for which she received another antibiotic. She had a hysterectomy in December and required a catheter post-surgery, which her primary care doctor thought might be related to her UTI. Her gynecologist later extended her antibiotic treatment to two weeks. Despite multiple treatments, her symptoms persisted, although subsequent urinalyses were negative. The only significant infection was the one in early April, which showed abnormal urinalysis with white blood cells, red blood cells, and Klebsiella growth.   Her urinalysis today was unremarkable.   Results for orders placed or performed in visit on 07/01/22  Bladder Scan (Post Void Residual) in office  Result Value Ref Range   Scan Result 0 ml   Results for orders placed or performed during the hospital encounter of 07/01/22  Urinalysis, Complete w Microscopic -  Result Value Ref Range   Color, Urine YELLOW YELLOW    APPearance HAZY (A) CLEAR   Specific Gravity, Urine 1.020 1.005 - 1.030   pH 7.5 5.0 - 8.0   Glucose, UA NEGATIVE NEGATIVE mg/dL   Hgb urine dipstick NEGATIVE NEGATIVE   Bilirubin Urine NEGATIVE NEGATIVE   Ketones, ur NEGATIVE NEGATIVE mg/dL   Protein, ur NEGATIVE NEGATIVE mg/dL   Nitrite NEGATIVE NEGATIVE   Leukocytes,Ua NEGATIVE NEGATIVE   Squamous Epithelial / HPF 0-5 0 - 5 /HPF   WBC, UA NONE SEEN 0 - 5 WBC/hpf   RBC / HPF NONE SEEN 0 - 5 RBC/hpf   Bacteria, UA RARE (A) NONE SEEN      PMH: Past Medical History:  Diagnosis Date   Abdominal pain    Anxiety    Depression    Endometriosis    GERD (gastroesophageal reflux disease)    Headache    Hemorrhoid    History of kidney stones    Migraine    PONV (postoperative nausea and vomiting)    Ulcerative colitis (HCC)     Surgical History: Past Surgical History:  Procedure Laterality Date   APPENDECTOMY  2013   with laparoscopy @ [place]   BREAST CYST EXCISION Right 2017   sebaceous cyst removed   BUNIONECTOMY  08/2008   CHROMOPERTUBATION N/A 06/30/2014   Procedure: CHROMOPERTUBATION;  Surgeon: Herold Harms, MD;  Location: [facility];  Service: Gynecology;  Laterality: N/A;   COLONOSCOPY  2014   DIAGNOSTIC LAPAROSCOPY     DILATION AND CURETTAGE OF UTERUS  2013   polyp   HERNIA REPAIR  1989   umbilical   INTRAUTERINE DEVICE INSERTION  06/24/2011  Mirena IUD   IUD REMOVAL N/A 06/30/2014   Procedure: INTRAUTERINE DEVICE (IUD) REMOVAL;  Surgeon: Herold Harms, MD;  Location: [facility];  Service: Gynecology;  Laterality: N/A;   IUD REMOVAL  12/27/2021   Procedure: INTRAUTERINE DEVICE (IUD) REMOVAL;  Surgeon: Linzie Collin, MD;  Location: [facility];  Service: Gynecology;;   LAPAROSCOPIC VAGINAL HYSTERECTOMY WITH SALPINGECTOMY Right 12/27/2021   Procedure: LAPAROSCOPIC ASSISTED VAGINAL HYSTERECTOMY WITH SALPINGECTOMY;  Surgeon: Linzie Collin, MD;  Location: [facility];  Service: Gynecology;   Laterality: Right;   LAPAROSCOPY N/A 06/30/2014   Procedure:  with excision of endoemtriosis and biopsy;  Surgeon: Herold Harms, MD;  Location: [facility];  Service: Gynecology;  Laterality: N/A;   PELVIC LAPAROSCOPY  2009   times 2   TONSILLECTOMY      Home Medications:  Allergies as of 07/01/2022       Reactions   Sulfa Antibiotics Nausea Only        Medication List        Accurate as of July 01, 2022  9:57 AM. If you have any questions, ask your nurse or doctor.          STOP taking these medications    budesonide-formoterol 160-4.5 MCG/ACT inhaler Commonly known as: SYMBICORT Stopped by: Vanna Scotland, MD   nitrofurantoin (macrocrystal-monohydrate) 100 MG capsule Commonly known as: Macrobid Stopped by: Vanna Scotland, MD   phentermine 37.5 MG tablet Commonly known as: ADIPEX-P Stopped by: Vanna Scotland, MD   zolmitriptan 5 MG nasal solution Commonly known as: ZOMIG Stopped by: Vanna Scotland, MD       TAKE these medications    ALPRAZolam 0.25 MG tablet Commonly known as: XANAX Take 1 tablet (0.25 mg total) by mouth daily as needed.   cyclobenzaprine 5 MG tablet Commonly known as: FLEXERIL Take 1 tablet (5 mg total) by mouth 3 (three) times daily as needed for muscle spasms.   Emgality 120 MG/ML Soaj Generic drug: Galcanezumab-gnlm Inject 120 mg into the skin every 30 (thirty) days.   NASACORT ALLERGY 24HR NA Place into the nose.   ondansetron 4 MG disintegrating tablet Commonly known as: ZOFRAN-ODT Take 1 tablet (4 mg total) by mouth every 8 (eight) hours as needed.   topiramate 100 MG tablet Commonly known as: TOPAMAX Take 1 tablet (100 mg total) by mouth daily.   traZODone 50 MG tablet Commonly known as: DESYREL TAKE 1/2 TO 1 TABLET BY MOUTH AT BEDTIME AS NEEDED FOR SLEEP   venlafaxine XR 150 MG 24 hr capsule Commonly known as: EFFEXOR-XR TAKE 1 CAPSULE(150 MG) BY MOUTH DAILY WITH BREAKFAST to be taken with the 75mg  for 225mg   daily What changed: Another medication with the same name was removed. Continue taking this medication, and follow the directions you see here. Changed by: Vanna Scotland, MD        Allergies:  Allergies  Allergen Reactions   Sulfa Antibiotics Nausea Only    Family History: Family History  Problem Relation Age of Onset   Breast cancer Mother 6   Cancer Mother    Diabetes Mother    Diabetes Maternal Grandmother    Brain cancer Maternal Grandfather    Cancer Paternal Grandmother        colon   Diabetes Paternal Grandmother    Heart disease Neg Hx    Ovarian cancer Neg Hx     Social History:  reports that she has never smoked. She has never used smokeless tobacco. She reports current alcohol  use. She reports that she does not use drugs.   Physical Exam: BP 109/68   Pulse 76   Ht 5\' 7"  (1.702 m)   Wt 136 lb 2 oz (61.7 kg)   LMP 12/15/2021 (Exact Date)   BMI 21.32 kg/m   Constitutional:  Alert and oriented, No acute distress. HEENT: [place] AT, moist mucus membranes.  Trachea midline, no masses. Neurologic: Grossly intact, no focal deficits, moving all 4 extremities. Psychiatric: Normal mood and affect.  Assessment & Plan:    1. Recurrent UTI - Likely only 1 true UTI in April with Klebsiella - Other instances likely false positives or over treated - No current evidence of infection - Continue monitoring; no antibiotics needed at this time - Discussed the role of hormones in UTI and potential use of topical estrogen if recurrent UTIs occur. - Consider further imaging or cystoscopy if recurrent symptoms persist  Return if symptoms worsen or fail to improve.  [facility] [address], [address] [place], [place] [address] [phone]

## 2022-08-10 IMAGING — MR MR MRA HEAD W/O CM
1 series · 19 of 48 positions shown · non-contrast
Comparison: Brain MRI 02/21/2017.

CLINICAL DATA: Headache disorder. Dizziness. Photophobia. Nausea.
Syncope, unspecified syncope type. Exertional headache. Additional
history provided: Patient reports chronic headaches, new onset
dizziness.

EXAM:
MRA HEAD WITHOUT CONTRAST
TECHNIQUE: Angiographic images of the Circle of Willis were acquired using MRA
technique without intravenous contrast.

[Series 2: TOF · axial · non-contrast · 0.5mm · 0.35mm/px · z∈[-41,+46]mm · 19 of 184 slices shown]
[im 1/184]
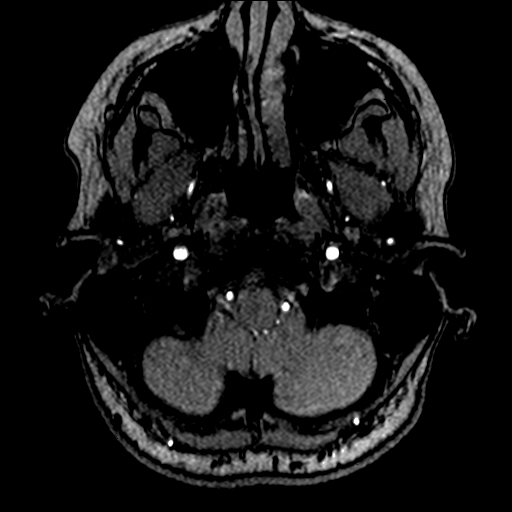
[im 4/184]
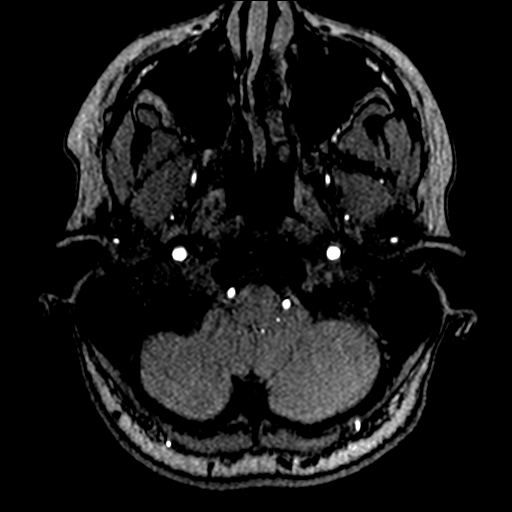
[im 8/184]
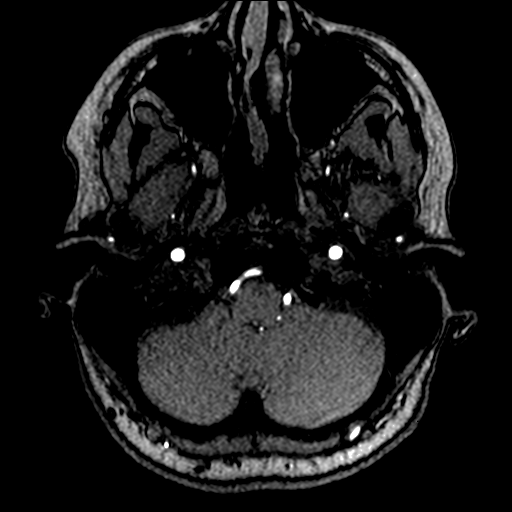
[im 12/184]
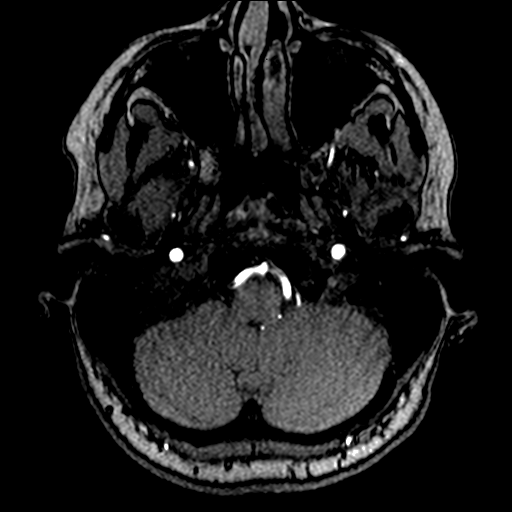
[im 16/184]
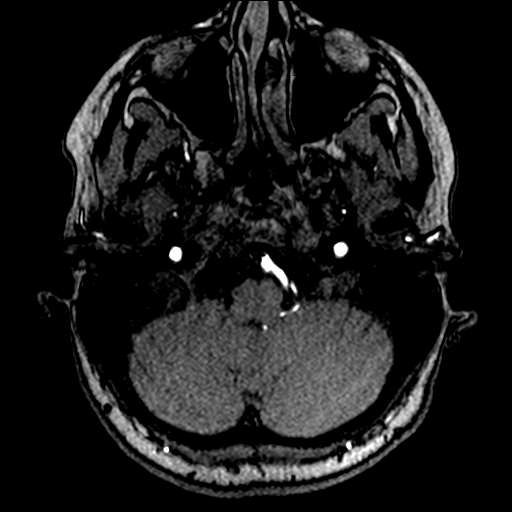
[im 20/184]
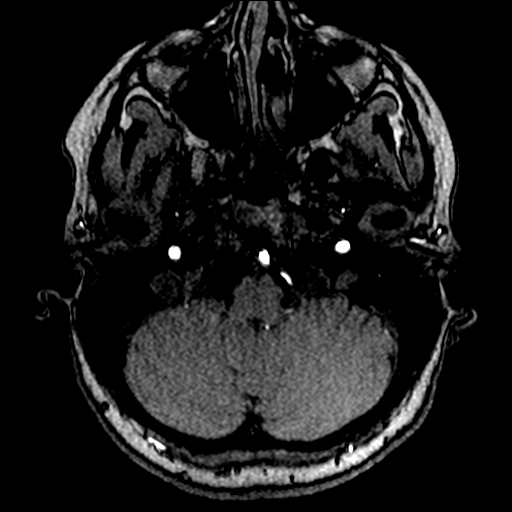
[im 24/184]
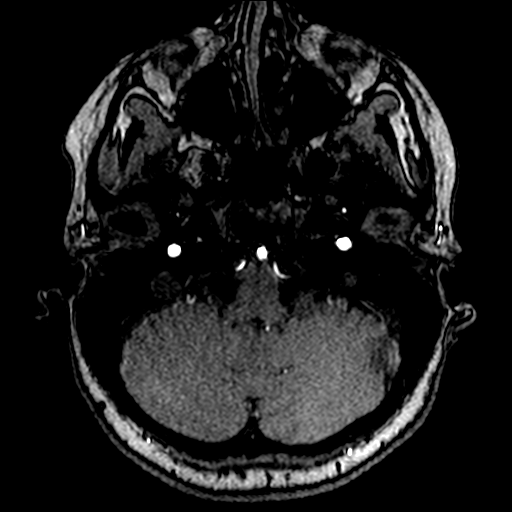
[im 28/184]
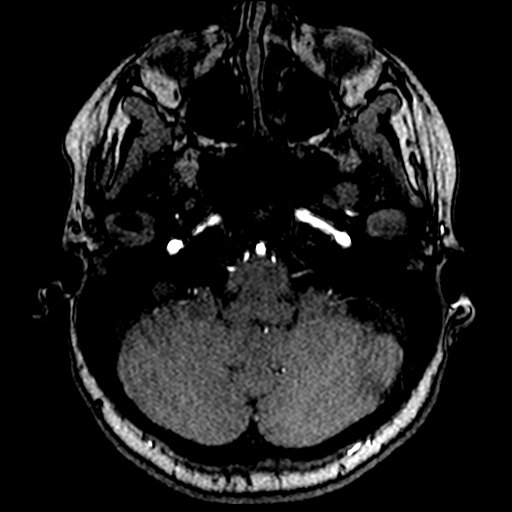
[im 32/184]
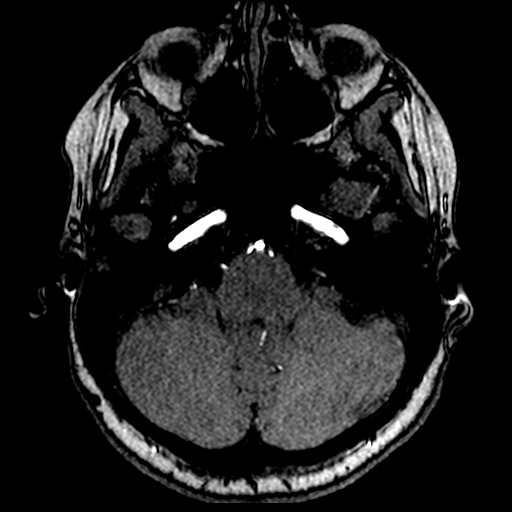
[im 36/184]
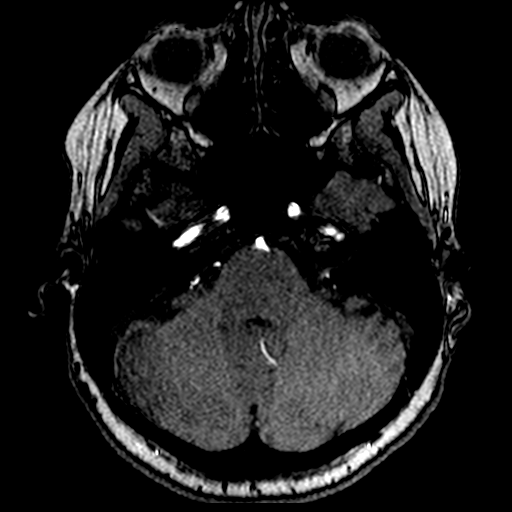
[im 39/184]
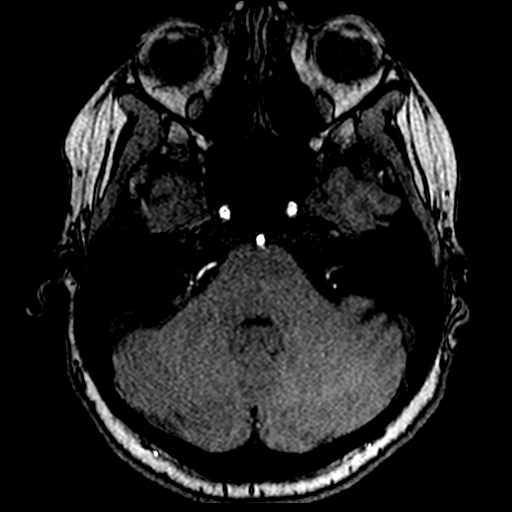
[im 59/184]
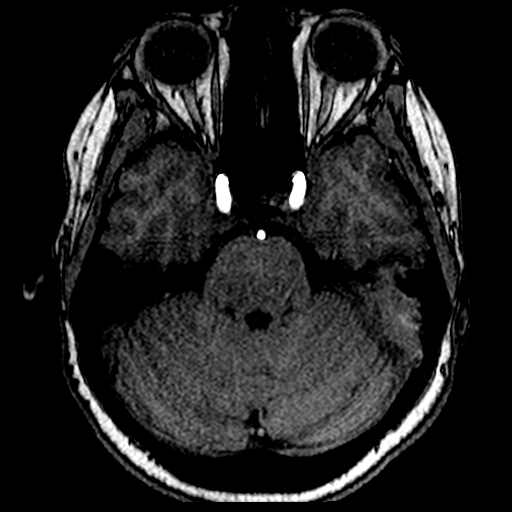
[im 82/184]
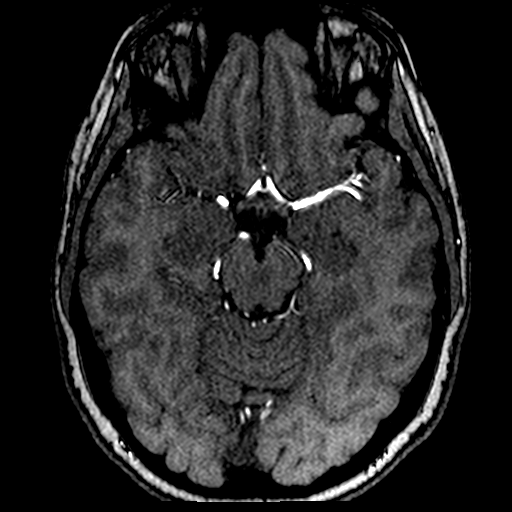
[im 94/184]
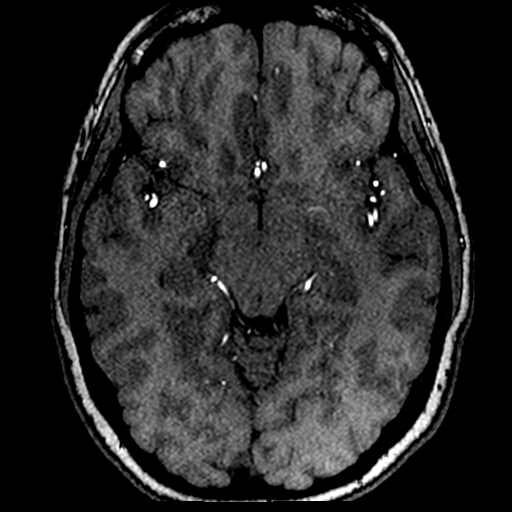
[im 106/184]
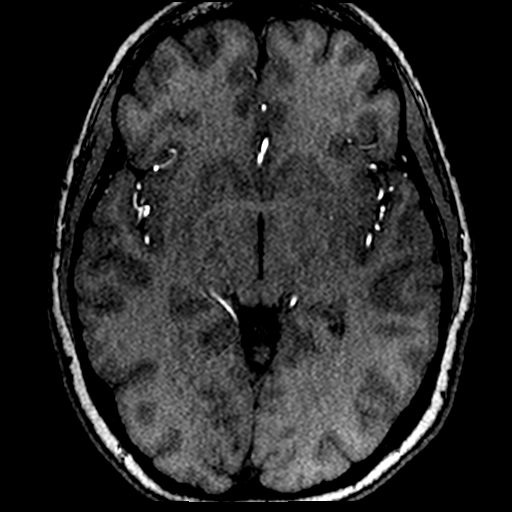
[im 129/184]
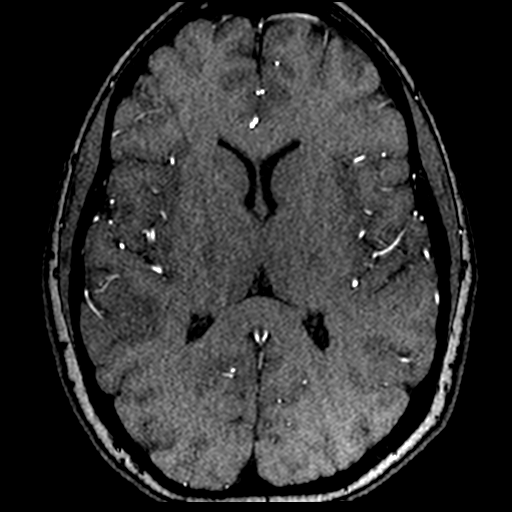
[im 152/184]
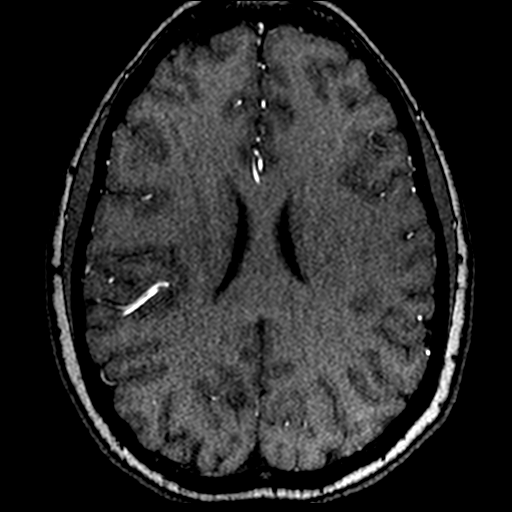
[im 156/184]
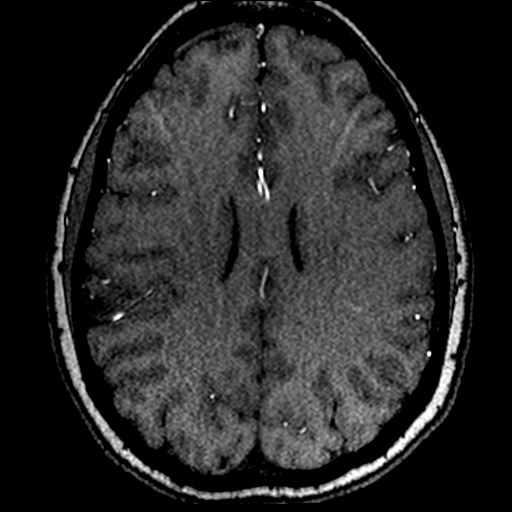
[im 176/184]
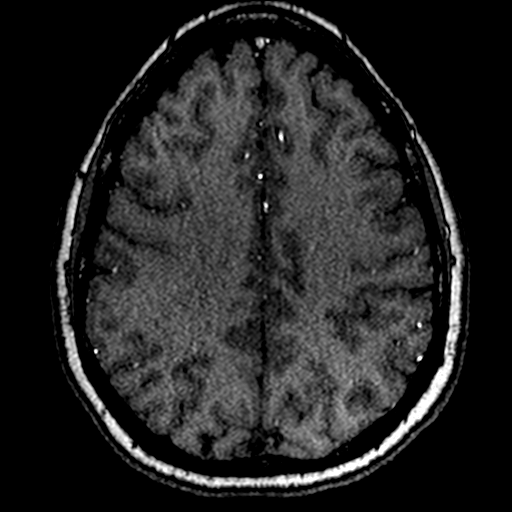

[19 of 48 positions shown; findings below may reference images not displayed]

FINDINGS: Anterior circulation:

The intracranial internal carotid arteries are patent. The M1 middle
cerebral arteries are patent. No M2 proximal branch occlusion or
high-grade proximal stenosis is identified. The anterior cerebral
arteries are patent. No intracranial aneurysm is identified.

Posterior circulation:

The intracranial vertebral arteries are patent. The basilar artery
is patent. The posterior cerebral arteries are patent. Posterior
communicating arteries are present bilaterally.

Anatomic variants: None significant.
IMPRESSION: Unremarkable examination. No intracranial large vessel occlusion or
proximal high-grade arterial stenosis.

No intracranial aneurysm is identified.

## 2022-09-28 ENCOUNTER — Ambulatory Visit
Admission: RE | Admit: 2022-09-28 | Discharge: 2022-09-28 | Disposition: A | Discharge status: Home / Self Care | Payer: 59 | Attending: Gastroenterology | Admitting: Gastroenterology

## 2022-09-28 ENCOUNTER — Encounter: Payer: Self-pay | Admitting: Gastroenterology

## 2022-09-28 ENCOUNTER — Ambulatory Visit: Payer: 59 | Admitting: Anesthesiology

## 2022-09-28 ENCOUNTER — Encounter
Admission: RE | Disposition: A | Discharge status: Home / Self Care | Payer: Self-pay | Source: Home / Self Care | Attending: Gastroenterology

## 2022-09-28 DIAGNOSIS — K640 First degree hemorrhoids: Secondary | ICD-10-CM | POA: Diagnosis not present

## 2022-09-28 DIAGNOSIS — F32A Depression, unspecified: Secondary | ICD-10-CM | POA: Diagnosis not present

## 2022-09-28 DIAGNOSIS — Q438 Other specified congenital malformations of intestine: Secondary | ICD-10-CM | POA: Diagnosis not present

## 2022-09-28 DIAGNOSIS — F419 Anxiety disorder, unspecified: Secondary | ICD-10-CM | POA: Insufficient documentation

## 2022-09-28 DIAGNOSIS — Z9049 Acquired absence of other specified parts of digestive tract: Secondary | ICD-10-CM | POA: Insufficient documentation

## 2022-09-28 DIAGNOSIS — Z1211 Encounter for screening for malignant neoplasm of colon: Secondary | ICD-10-CM | POA: Insufficient documentation

## 2022-09-28 DIAGNOSIS — Z83719 Family history of colon polyps, unspecified: Secondary | ICD-10-CM | POA: Insufficient documentation

## 2022-09-28 DIAGNOSIS — G43909 Migraine, unspecified, not intractable, without status migrainosus: Secondary | ICD-10-CM | POA: Insufficient documentation

## 2022-09-28 DIAGNOSIS — K5150 Left sided colitis without complications: Secondary | ICD-10-CM | POA: Insufficient documentation

## 2022-09-28 HISTORY — PX: COLONOSCOPY: SHX5424

## 2022-09-28 HISTORY — PX: BIOPSY: SHX5522

## 2022-09-28 SURGERY — COLONOSCOPY
Anesthesia: General

## 2022-09-28 MED ORDER — DEXMEDETOMIDINE HCL IN NACL 200 MCG/50ML IV SOLN
INTRAVENOUS | Status: DC | PRN
Start: 2022-09-28 — End: 2022-09-28
  Administered 2022-09-28: 8 ug via INTRAVENOUS

## 2022-09-28 MED ORDER — PROPOFOL 500 MG/50ML IV EMUL
INTRAVENOUS | Status: DC | PRN
  Administered 2022-09-28: 150 ug/kg/min via INTRAVENOUS

## 2022-09-28 MED ORDER — PROPOFOL 10 MG/ML IV BOLUS
INTRAVENOUS | Status: DC | PRN
  Administered 2022-09-28 (×3): 10 mg via INTRAVENOUS
  Administered 2022-09-28: 70 mg via INTRAVENOUS

## 2022-09-28 MED ORDER — LIDOCAINE HCL (CARDIAC) PF 100 MG/5ML IV SOSY
PREFILLED_SYRINGE | INTRAVENOUS | Status: DC | PRN
  Administered 2022-09-28: 40 mg via INTRAVENOUS

## 2022-09-28 MED ORDER — SODIUM CHLORIDE 0.9 % IV SOLN: INTRAVENOUS | Status: DC

## 2022-09-28 NOTE — Interval H&P Note (Signed)
History and Physical Interval Note: Preprocedure H&P from 09/28/22  was reviewed and there was no interval change after seeing and examining the patient.  Written consent was obtained from the patient after discussion of risks, benefits, and alternatives. Patient has consented to proceed with Colonoscopy with possible intervention   09/28/2022 10:35 AM  Bethany Harrison  has presented today for surgery, with the diagnosis of History of ulcerative colitis (Z87.19) FH: colon polyps (Z83.719).  The various methods of treatment have been discussed with the patient and family. After consideration of risks, benefits and other options for treatment, the patient has consented to  Procedure(s): COLONOSCOPY (N/A) as a surgical intervention.  The patient's history has been reviewed, patient examined, no change in status, stable for surgery.  I have reviewed the patient's chart and labs.  Questions were answered to the patient's satisfaction.     [name]

## 2022-09-28 NOTE — Anesthesia Preprocedure Evaluation (Signed)
Anesthesia Evaluation  Patient identified by MRN, date of birth, ID band Patient awake    Reviewed: Allergy & Precautions, NPO status , Patient's Chart, lab work & pertinent test results  History of Anesthesia Complications (+) PONV and history of anesthetic complications  Airway Mallampati: II  TM Distance: >3 FB Neck ROM: full    Dental no notable dental hx.    Pulmonary neg pulmonary ROS   Pulmonary exam normal        Cardiovascular negative cardio ROS Normal cardiovascular exam     Neuro/Psych  Headaches PSYCHIATRIC DISORDERS Anxiety Depression       GI/Hepatic Neg liver ROS, PUD,GERD  Medicated,,UC    Endo/Other  negative endocrine ROS    Renal/GU negative Renal ROS  negative genitourinary   Musculoskeletal   Abdominal   Peds  Hematology negative hematology ROS (+)   Anesthesia Other Findings Past Medical History: No date: Abdominal pain No date: Anxiety No date: Depression No date: Endometriosis No date: GERD (gastroesophageal reflux disease) No date: Headache No date: Hemorrhoid No date: History of kidney stones No date: Migraine No date: PONV (postoperative nausea and vomiting) No date: Ulcerative colitis [facility])  Past Surgical History: 2013: APPENDECTOMY     Comment:  with laparoscopy @ [facility] 2017: BREAST CYST EXCISION; Right     Comment:  sebaceous cyst removed 08/2008: BUNIONECTOMY 06/30/2014: CHROMOPERTUBATION; N/A     Comment:  Procedure: CHROMOPERTUBATION;  Surgeon: Herold Harms, MD;  Location: [facility] ORS;  Service:               Gynecology;  Laterality: N/A; 2014: COLONOSCOPY No date: DIAGNOSTIC LAPAROSCOPY 2013: DILATION AND CURETTAGE OF UTERUS     Comment:  polyp 1989: HERNIA REPAIR     Comment:  umbilical 06/24/2011: INTRAUTERINE DEVICE INSERTION     Comment:  Mirena IUD 06/30/2014: IUD REMOVAL; N/A     Comment:  Procedure: INTRAUTERINE DEVICE (IUD)  REMOVAL;  Surgeon:               Herold Harms, MD;  Location: [facility] ORS;  Service:               Gynecology;  Laterality: N/A; 12/27/2021: IUD REMOVAL     Comment:  Procedure: INTRAUTERINE DEVICE (IUD) REMOVAL;  Surgeon:               Linzie Collin, MD;  Location: [facility] ORS;  Service:               Gynecology;; 12/27/2021: LAPAROSCOPIC VAGINAL HYSTERECTOMY WITH SALPINGECTOMY;  Right     Comment:  Procedure: LAPAROSCOPIC ASSISTED VAGINAL HYSTERECTOMY               WITH SALPINGECTOMY;  Surgeon: Linzie Collin, MD;                Location: [facility] ORS;  Service: Gynecology;  Laterality:               Right; 06/30/2014: LAPAROSCOPY; N/A     Comment:  Procedure:  with excision of endoemtriosis and biopsy;                Surgeon: Herold Harms, MD;  Location: [facility] ORS;                Service: Gynecology;  Laterality: N/A; 2009: PELVIC LAPAROSCOPY     Comment:  times 2 No date: TONSILLECTOMY  BMI    Body Mass Index: 20.52 kg/m      Reproductive/Obstetrics negative OB ROS                             Anesthesia Physical Anesthesia Plan  ASA: 2  Anesthesia Plan: General   Post-op Pain Management: Minimal or no pain anticipated   Induction: Intravenous  PONV Risk Score and Plan: 3 and Propofol infusion and TIVA  Airway Management Planned: Natural Airway and Nasal Cannula  Additional Equipment:   Intra-op Plan:   Post-operative Plan:   Informed Consent: I have reviewed the patients History and Physical, chart, labs and discussed the procedure including the risks, benefits and alternatives for the proposed anesthesia with the patient or authorized representative who has indicated his/her understanding and acceptance.     Dental Advisory Given  Plan Discussed with: Anesthesiologist, CRNA and Surgeon  Anesthesia Plan Comments: (Patient consented for risks of anesthesia including but not limited to:  - adverse reactions to  medications - risk of airway placement if required - damage to eyes, teeth, lips or other oral mucosa - nerve damage due to positioning  - sore throat or hoarseness - Damage to heart, brain, nerves, lungs, other parts of body or loss of life  Patient voiced understanding.)       Anesthesia Quick Evaluation

## 2022-09-28 NOTE — Anesthesia Postprocedure Evaluation (Signed)
Anesthesia Post Note  Patient: Bethany Harrison  Procedure(s) Performed: COLONOSCOPY BIOPSY  Patient location during evaluation: Endoscopy Anesthesia Type: General Level of consciousness: awake and alert Pain management: pain level controlled Vital Signs Assessment: post-procedure vital signs reviewed and stable Respiratory status: spontaneous breathing, nonlabored ventilation, respiratory function stable and patient connected to nasal cannula oxygen Cardiovascular status: blood pressure returned to baseline and stable Postop Assessment: no apparent nausea or vomiting Anesthetic complications: no   No notable events documented.   Last Vitals:  Vitals:   09/28/22 1145 09/28/22 1155  BP: (!) 81/61 99/65  Pulse: 73 66  Resp: 10 16  Temp: (!) 36.4 C   SpO2: 100% 100%    Last Pain:  Vitals:   09/28/22 1155  TempSrc:   PainSc: 0-No pain                 Louie Boston

## 2022-09-28 NOTE — Op Note (Addendum)
[facility] Gastroenterology Patient Name: Bethany Harrison Procedure Date: 09/28/2022 11:07 AM MRN: [id number] Account #: [id number] Date of Birth: 1985-10-24 Admit Type: Outpatient Age: 37 Room: [facility] ENDO ROOM 1 Gender: Female Note Status: Supervisor Override Instrument Name: Peds Colonoscope [id number] Procedure:             Colonoscopy Indications:           Colon cancer screening in patient at increased risk:                         Family history of 1st-degree relative with colon                         polyps, High risk colon cancer surveillance:                         Ulcerative left sided colitis of 8 (or more) years                         duration Providers:             [name] DO, DO Referring MD:          Dorcas Carrow (Referring MD) Medicines:             Monitored Anesthesia Care Complications:         No immediate complications. Estimated blood loss:                         Minimal. Procedure:             Pre-Anesthesia Assessment:                        - Prior to the procedure, a History and Physical was                         performed, and patient medications and allergies were                         reviewed. The patient is competent. The risks and                         benefits of the procedure and the sedation options and                         risks were discussed with the patient. All questions                         were answered and informed consent was obtained.                         Patient identification and proposed procedure were                         verified by the physician, the nurse, the anesthetist                         and the technician in the endoscopy suite. Mental  Status Examination: alert and oriented. Airway                         Examination: normal oropharyngeal airway and neck                         mobility. Respiratory Examination: clear to                          auscultation. CV Examination: RRR, no murmurs, no S3                         or S4. Prophylactic Antibiotics: The patient does not                         require prophylactic antibiotics. Prior                         Anticoagulants: The patient has taken no anticoagulant                         or antiplatelet agents. ASA Grade Assessment: II - A                         patient with mild systemic disease. After reviewing                         the risks and benefits, the patient was deemed in                         satisfactory condition to undergo the procedure. The                         anesthesia plan was to use monitored anesthesia care                         (MAC). Immediately prior to administration of                         medications, the patient was re-assessed for adequacy                         to receive sedatives. The heart rate, respiratory                         rate, oxygen saturations, blood pressure, adequacy of                         pulmonary ventilation, and response to care were                         monitored throughout the procedure. The physical                         status of the patient was re-assessed after the                         procedure.  After obtaining informed consent, the colonoscope was                         passed under direct vision. Throughout the procedure,                         the patient's blood pressure, pulse, and oxygen                         saturations were monitored continuously. The                         Colonoscope was introduced through the anus and                         advanced to the the terminal ileum, with                         identification of the appendiceal orifice and IC                         valve. The colonoscopy was performed without                         difficulty. The patient tolerated the procedure well.                         The quality of the bowel preparation  was evaluated                         using the BBPS [facility] Bowel Preparation Scale) with                         scores of: Right Colon = 3, Transverse Colon = 3 and                         Left Colon = 3 (entire mucosa seen well with no                         residual staining, small fragments of stool or opaque                         liquid). The total BBPS score equals 9. The terminal                         ileum, ileocecal valve, appendiceal orifice, and                         rectum were photographed. Findings:      The perianal and digital rectal examinations were normal. Pertinent       negatives include normal sphincter tone.      The terminal ileum appeared normal. Estimated blood loss: none.      The sigmoid colon was moderately redundant. Estimated blood loss: none.      Normal mucosa was found in the entire colon. Biopsies were taken with a       cold forceps for histology. Biopsies taken from ascending, transverse,  left colon, and rectum. All placed in separate pathology jars. Estimated       blood loss was minimal.      Non-bleeding internal hemorrhoids were found during retroflexion. The       hemorrhoids were Grade I (internal hemorrhoids that do not prolapse).       Estimated blood loss: none.      The exam was otherwise without abnormality on direct and retroflexion       views. Impression:            - The examined portion of the ileum was normal.                        - Redundant colon.                        - Normal mucosa in the entire examined colon. Biopsied.                        - Non-bleeding internal hemorrhoids.                        - The examination was otherwise normal on direct and                         retroflexion views. Recommendation:        - Patient has a contact number available for                         emergencies. The signs and symptoms of potential                         delayed complications were discussed with the  patient.                         Return to normal activities tomorrow. Written                         discharge instructions were provided to the patient.                        - Discharge patient to home.                        - Resume previous diet.                        - Continue present medications.                        - Await pathology results.                        - Repeat colonoscopy for surveillance based on                         pathology results.                        - Return to GI office as previously scheduled.                        -  The findings and recommendations were discussed with                         the patient. Procedure Code(s):     --- Professional ---                        [phone], Colonoscopy, flexible; with biopsy, single or                         multiple Diagnosis Code(s):     --- Professional ---                        K51.50, Left sided colitis without complications                        K64.0, First degree hemorrhoids                        Q43.8, Other specified congenital malformations of                         intestine CPT copyright 2022 American Medical Association. All rights reserved. The codes documented in this report are preliminary and upon coder review may  be revised to meet current compliance requirements. Attending Participation:      I personally performed the entire procedure. Elfredia Nevins, DO [name] DO, DO 09/28/2022 11:45:31 AM This report has been signed electronically. Number of Addenda: 0 Note Initiated On: 09/28/2022 11:07 AM Scope Withdrawal Time: 0 hours 10 minutes 26 seconds  Total Procedure Duration: 0 hours 21 minutes 59 seconds  Estimated Blood Loss:  Estimated blood loss was minimal.      [facility]

## 2022-09-28 NOTE — Transfer of Care (Signed)
Immediate Anesthesia Transfer of Care Note  Patient: Bethany Harrison  Procedure(s) Performed: Procedure(s): COLONOSCOPY (N/A) BIOPSY  Patient Location: PACU and Endoscopy Unit  Anesthesia Type:General  Level of Consciousness: sedated  Airway & Oxygen Therapy: Patient Spontanous Breathing and Patient connected to nasal cannula oxygen  Post-op Assessment: Report given to RN and Post -op Vital signs reviewed and stable  Post vital signs: Reviewed and stable  Last Vitals:  Vitals:   09/28/22 1020 09/28/22 1145  BP: (!) 108/92 (!) 81/61  Pulse: 94 73  Resp: 20 10  Temp: (!) 36.4 C (!) 36.4 C  SpO2: 100% 100%    Complications: No apparent anesthesia complications

## 2022-09-28 NOTE — H&P (Signed)
Pre-Procedure H&P   Patient ID: Bethany Harrison is a 37 y.o. female.  Gastroenterology Provider: [name], DO  Referring Provider: Fransico Setters, NP PCP: Dorcas Carrow, DO  Date: 09/28/2022  HPI [name] is a 37 y.o. female who presents today for Colonoscopy for Personal history of colitis.  Pt diagnosed with colitis (UC?) in 2009 in the rectum. She has remained off therapy and repeat colonoscopy did not demonstrate active disease in 09/2017.  Was on apriso from 2009-2014  Reports constipation helped by AG1. No m/h. No urgency/tenesmus/nocturnal awakening.  Mother- colon polyps; pgm - crc  Hgb 14.3 mcv 101, plt 353 cr 0.82  Hysterectomy in 2023  Past Medical History:  Diagnosis Date   Abdominal pain    Anxiety    Depression    Endometriosis    GERD (gastroesophageal reflux disease)    Headache    Hemorrhoid    History of kidney stones    Migraine    PONV (postoperative nausea and vomiting)    Ulcerative colitis (HCC)     Past Surgical History:  Procedure Laterality Date   APPENDECTOMY  2013   with laparoscopy @ [place]   BREAST CYST EXCISION Right 2017   sebaceous cyst removed   BUNIONECTOMY  08/2008   CHROMOPERTUBATION N/A 06/30/2014   Procedure: CHROMOPERTUBATION;  Surgeon: Herold Harms, MD;  Location: [facility] ORS;  Service: Gynecology;  Laterality: N/A;   COLONOSCOPY  2014   DIAGNOSTIC LAPAROSCOPY     DILATION AND CURETTAGE OF UTERUS  2013   polyp   HERNIA REPAIR  1989   umbilical   INTRAUTERINE DEVICE INSERTION  06/24/2011   Mirena IUD   IUD REMOVAL N/A 06/30/2014   Procedure: INTRAUTERINE DEVICE (IUD) REMOVAL;  Surgeon: Herold Harms, MD;  Location: [facility] ORS;  Service: Gynecology;  Laterality: N/A;   IUD REMOVAL  12/27/2021   Procedure: INTRAUTERINE DEVICE (IUD) REMOVAL;  Surgeon: Linzie Collin, MD;  Location: [facility] ORS;  Service: Gynecology;;   LAPAROSCOPIC VAGINAL HYSTERECTOMY WITH SALPINGECTOMY Right  12/27/2021   Procedure: LAPAROSCOPIC ASSISTED VAGINAL HYSTERECTOMY WITH SALPINGECTOMY;  Surgeon: Linzie Collin, MD;  Location: [facility] ORS;  Service: Gynecology;  Laterality: Right;   LAPAROSCOPY N/A 06/30/2014   Procedure:  with excision of endoemtriosis and biopsy;  Surgeon: Herold Harms, MD;  Location: [facility] ORS;  Service: Gynecology;  Laterality: N/A;   PELVIC LAPAROSCOPY  2009   times 2   TONSILLECTOMY      Family History Motehr- colon polyps; PGM - CRC No h/o GI disease or malignancy  Review of Systems  Constitutional:  Negative for activity change, appetite change, chills, diaphoresis, fatigue, fever and unexpected weight change.  HENT:  Negative for trouble swallowing and voice change.   Respiratory:  Negative for shortness of breath and wheezing.   Cardiovascular:  Negative for chest pain, palpitations and leg swelling.  Gastrointestinal:  Negative for abdominal distention, abdominal pain, anal bleeding, blood in stool, constipation, diarrhea, nausea, rectal pain and vomiting.  Musculoskeletal:  Negative for arthralgias and myalgias.  Skin:  Negative for color change and pallor.  Neurological:  Negative for dizziness, syncope and weakness.  Psychiatric/Behavioral:  Negative for confusion.   All other systems reviewed and are negative.    Medications No current facility-administered medications on file prior to encounter.   Current Outpatient Medications on File Prior to Encounter  Medication Sig Dispense Refill   topiramate (TOPAMAX) 100 MG tablet Take 1 tablet (100 mg  total) by mouth daily. 90 tablet 3   venlafaxine XR (EFFEXOR-XR) 150 MG 24 hr capsule TAKE 1 CAPSULE(150 MG) BY MOUTH DAILY WITH BREAKFAST to be taken with the 75mg  for 225mg  daily 90 capsule 0   ALPRAZolam (XANAX) 0.25 MG tablet Take 1 tablet (0.25 mg total) by mouth daily as needed. 15 tablet 0   cyclobenzaprine (FLEXERIL) 5 MG tablet Take 1 tablet (5 mg total) by mouth 3 (three) times daily as  needed for muscle spasms. 30 tablet 1   Galcanezumab-gnlm (EMGALITY) 120 MG/ML SOAJ Inject 120 mg into the skin every 30 (thirty) days. 1.12 mL 11   ondansetron (ZOFRAN-ODT) 4 MG disintegrating tablet Take 1 tablet (4 mg total) by mouth every 8 (eight) hours as needed. 20 tablet 3   traZODone (DESYREL) 50 MG tablet TAKE 1/2 TO 1 TABLET BY MOUTH AT BEDTIME AS NEEDED FOR SLEEP 90 tablet 1   Triamcinolone Acetonide (NASACORT ALLERGY 24HR NA) Place into the nose.      Pertinent medications related to GI and procedure were reviewed by me with the patient prior to the procedure   Current Facility-Administered Medications:    0.9 %  sodium chloride infusion, , Intravenous, Continuous, [name], DO, Last Rate: 20 mL/hr at 09/28/22 1032, New Bag at 09/28/22 1032  sodium chloride 20 mL/hr at 09/28/22 1032       Allergies  Allergen Reactions   Sulfa Antibiotics Nausea Only   Allergies were reviewed by me prior to the procedure  Objective   Body mass index is 20.52 kg/m. Vitals:   09/28/22 1011 09/28/22 1020  BP:  (!) 108/92  Pulse:  94  Resp:  20  Temp:  (!) 97.5 F (36.4 C)  TempSrc:  Temporal  SpO2:  100%  Weight:  59.4 kg  Height: 5\' 7"  (1.702 m)      Physical Exam Vitals and nursing note reviewed.  Constitutional:      General: She is not in acute distress.    Appearance: Normal appearance. She is not ill-appearing, toxic-appearing or diaphoretic.  HENT:     Head: Normocephalic and atraumatic.     Nose: Nose normal.     Mouth/Throat:     Mouth: Mucous membranes are moist.     Pharynx: Oropharynx is clear.  Eyes:     General: No scleral icterus.    Extraocular Movements: Extraocular movements intact.  Cardiovascular:     Rate and Rhythm: Normal rate and regular rhythm.     Heart sounds: Normal heart sounds. No murmur heard.    No friction rub. No gallop.  Pulmonary:     Effort: Pulmonary effort is normal. No respiratory distress.     Breath sounds:  Normal breath sounds. No wheezing, rhonchi or rales.  Abdominal:     General: Bowel sounds are normal. There is no distension.     Palpations: Abdomen is soft.     Tenderness: There is no abdominal tenderness. There is no guarding or rebound.  Musculoskeletal:     Cervical back: Neck supple.     Right lower leg: No edema.     Left lower leg: No edema.  Skin:    General: Skin is warm and dry.     Coloration: Skin is not jaundiced or pale.  Neurological:     General: No focal deficit present.     Mental Status: She is alert and oriented to person, place, and time. Mental status is at baseline.  Psychiatric:  Mood and Affect: Mood normal.        Behavior: Behavior normal.        Thought Content: Thought content normal.        Judgment: Judgment normal.      Assessment:  Bethany Harrison is a 37 y.o. female  who presents today for Colonoscopy for Personal history of colitis .  Plan:  Colonoscopy with possible intervention today  Colonoscopy with possible biopsy, control of bleeding, polypectomy, and interventions as necessary has been discussed with the patient/patient representative. Informed consent was obtained from the patient/patient representative after explaining the indication, nature, and risks of the procedure including but not limited to death, bleeding, perforation, missed neoplasm/lesions, cardiorespiratory compromise, and reaction to medications. Opportunity for questions was given and appropriate answers were provided. Patient/patient representative has verbalized understanding is amenable to undergoing the procedure.   [name], DO  [facility] Gastroenterology  Portions of the record may have been created with voice recognition software. Occasional wrong-word or 'sound-a-like' substitutions may have occurred due to the inherent limitations of voice recognition software.  Read the chart carefully and recognize, using context, where substitutions  may have occurred.

## 2022-09-29 ENCOUNTER — Encounter: Payer: Self-pay | Admitting: Gastroenterology

## 2022-09-29 LAB — SURGICAL PATHOLOGY

## 2022-10-14 ENCOUNTER — Encounter: Payer: Self-pay | Admitting: Neurology

## 2022-10-17 ENCOUNTER — Ambulatory Visit: Payer: 59 | Admitting: Family Medicine

## 2022-10-27 MED ORDER — UBRELVY 100 MG PO TABS: 100.0000 mg | ORAL_TABLET | ORAL | 11 refills | Status: DC | PRN

## 2022-10-27 NOTE — Addendum Note (Signed)
Addended by: Glean Salvo on: 10/27/2022 03:09 PM   Modules accepted: Orders

## 2022-11-14 ENCOUNTER — Ambulatory Visit: Payer: 59 | Admitting: Family Medicine

## 2022-11-14 ENCOUNTER — Encounter: Payer: Self-pay | Admitting: Family Medicine

## 2022-11-14 VITALS — BP 102/73 | HR 86 | Ht 67.0 in | Wt 138.0 lb

## 2022-11-14 DIAGNOSIS — D7589 Other specified diseases of blood and blood-forming organs: Secondary | ICD-10-CM | POA: Diagnosis not present

## 2022-11-14 DIAGNOSIS — F339 Major depressive disorder, recurrent, unspecified: Secondary | ICD-10-CM | POA: Diagnosis not present

## 2022-11-14 MED ORDER — BUPROPION HCL ER (SR) 150 MG PO TB12: ORAL_TABLET | ORAL | 2 refills | Status: DC

## 2022-11-14 NOTE — Progress Notes (Signed)
BP 102/73   Pulse 86   Ht 5\' 7"  (1.702 m)   Wt 138 lb (62.6 kg)   LMP 12/15/2021 (Exact Date)   SpO2 97%   BMI 21.61 kg/m    Subjective:    Patient ID: Bethany Harrison, female    DOB: [birth year], 37 y.o.   MRN: [id number]  HPI: Bethany Harrison is a 37 y.o. female  Chief Complaint  Patient presents with   Depression    Patient says she is having marriage issues for the past year, and says she thinks that the marriage counseling is triggering her Depression is getting worse. Patient says she does it in a couple setting and thinks she may have discuss individual setting. Patient says she has thoughts of feeling that if she wasn't here, she would be better off.    DEPRESSION Mood status: exacerbated Satisfied with current treatment?: no Symptom severity: moderate  Duration of current treatment : chronic Side effects: no Medication compliance: excellent compliance Psychotherapy/counseling: yes in the past Previous psychiatric medications: effexor Depressed mood: yes Anxious mood: yes Anhedonia: no Significant weight loss or gain: no Insomnia: no  Fatigue: yes Feelings of worthlessness or guilt: yes Impaired concentration/indecisiveness: yes Suicidal ideations: no Hopelessness: no Crying spells: yes    05/31/2022   10:22 AM 03/15/2022    9:31 AM 10/25/2021   11:34 AM 08/02/2021    2:41 PM 05/06/2021    4:05 PM  Depression screen PHQ 2/9  Decreased Interest 1 1 1  0 1  Down, Depressed, Hopeless 2 2 2 1 1   PHQ - 2 Score 3 3 3 1 2   Altered sleeping 2 2 1 2 1   Tired, decreased energy 2 2 1  0 0  Change in appetite 0 1 1 0 0  Feeling bad or failure about yourself  1 0 1 0 0  Trouble concentrating 1 0 1 1 1   Moving slowly or fidgety/restless 2 2 1 1  0  Suicidal thoughts 1 0 0 0 0  PHQ-9 Score 12 10 9 5 4   Difficult doing work/chores Somewhat difficult Somewhat difficult Somewhat difficult Somewhat difficult Not difficult at all     Relevant past medical, surgical, family and  social history reviewed and updated as indicated. Interim medical history since our last visit reviewed. Allergies and medications reviewed and updated.  Review of Systems  Constitutional: Negative.   Respiratory: Negative.    Cardiovascular: Negative.   Gastrointestinal: Negative.   Musculoskeletal: Negative.   Psychiatric/Behavioral:  Positive for dysphoric mood. Negative for agitation, behavioral problems, confusion, decreased concentration, hallucinations, self-injury, sleep disturbance and suicidal ideas. The patient is not nervous/anxious and is not hyperactive.     Per HPI unless specifically indicated above     Objective:    BP 102/73   Pulse 86   Ht 5\' 7"  (1.702 m)   Wt 138 lb (62.6 kg)   LMP 12/15/2021 (Exact Date)   SpO2 97%   BMI 21.61 kg/m   Wt Readings from Last 3 Encounters:  11/14/22 138 lb (62.6 kg)  09/28/22 131 lb (59.4 kg)  07/01/22 136 lb 2 oz (61.7 kg)    Physical Exam Vitals and nursing note reviewed.  Constitutional:      General: She is not in acute distress.    Appearance: Normal appearance. She is normal weight. She is not ill-appearing, toxic-appearing or diaphoretic.  HENT:     Head: Normocephalic and atraumatic.     Right Ear: External ear normal.  Left Ear: External ear normal.     Nose: Nose normal.     Mouth/Throat:     Mouth: Mucous membranes are moist.     Pharynx: Oropharynx is clear.  Eyes:     General: No scleral icterus.       Right eye: No discharge.        Left eye: No discharge.     Extraocular Movements: Extraocular movements intact.     Conjunctiva/sclera: Conjunctivae normal.     Pupils: Pupils are equal, round, and reactive to light.  Cardiovascular:     Rate and Rhythm: Normal rate and regular rhythm.     Pulses: Normal pulses.     Heart sounds: Normal heart sounds. No murmur heard.    No friction rub. No gallop.  Pulmonary:     Effort: Pulmonary effort is normal. No respiratory distress.     Breath sounds:  Normal breath sounds. No stridor. No wheezing, rhonchi or rales.  Chest:     Chest wall: No tenderness.  Musculoskeletal:        General: Normal range of motion.     Cervical back: Normal range of motion and neck supple.  Skin:    General: Skin is warm and dry.     Capillary Refill: Capillary refill takes less than 2 seconds.     Coloration: Skin is not jaundiced or pale.     Findings: No bruising, erythema, lesion or rash.  Neurological:     General: No focal deficit present.     Mental Status: She is alert and oriented to person, place, and time. Mental status is at baseline.  Psychiatric:        Mood and Affect: Mood normal.        Behavior: Behavior normal.        Thought Content: Thought content normal.        Judgment: Judgment normal.     Results for orders placed or performed during the hospital encounter of 09/28/22  Surgical pathology  Result Value Ref Range   SURGICAL PATHOLOGY      SURGICAL PATHOLOGY [facility] [address], [address] [place], [place] [address] Telephone [phone] or [phone] Fax [phone]  REPORT OF SURGICAL PATHOLOGY   Accession #: [id number] Patient Name: Bethany Harrison, Bethany Harrison Visit # : [id number]  MRN: [id number] Physician: Elfredia Nevins DOB/Age September 19, 1985 (Age: 28) Gender: F Collected Date: 09/28/2022 Received Date: 09/28/2022  FINAL DIAGNOSIS       1. Ascending Colon Biopsy, CBX :       - UNREMARKABLE COLONIC MUCOSA.      - NEGATIVE FOR MICROSCOPIC COLITIS, GRANULOMAS, DYSPLASIA, AND MALIGNANCY.       2. Transverse Colon Biopsy, CBX :       - UNREMARKABLE COLONIC MUCOSA.      - NEGATIVE FOR MICROSCOPIC COLITIS, GRANULOMAS, DYSPLASIA, AND MALIGNANCY.       3. Descending Colon Biopsy,  :       - UNREMARKABLE COLONIC MUCOSA.      - NEGATIVE FOR MICROSCOPIC COLITIS, GRANULOMAS, DYSPLASIA, AND MALIGNANCY.       4. Rectum, biopsy, CBX :       - UNREMARKABLE COLONIC-TYPE MUCOSA.      - NEGATI VE FOR  GRANULOMAS, DYSPLASIA, AND MALIGNANCY.       ELECTRONIC SIGNATURE : Rubinas Md, Delice Bison , Sports administrator, Electronic Signature  MICROSCOPIC DESCRIPTION  CASE COMMENTS STAINS USED IN DIAGNOSIS: H&E H&E H&E H&E    CLINICAL HISTORY  SPECIMEN(S) OBTAINED 1. Ascending Colon Biopsy, CBX 2. Transverse Colon Biopsy, CBX 3. Descending Colon Biopsy, 4. Rectum, biopsy, CBX  SPECIMEN COMMENTS: 1. R/O colitis 2. R/O colitis 3. R/O colitis 4. R/O colitis SPECIMEN CLINICAL INFORMATION: 1. Hemorrhoids    Gross Description 1. "CBX ascending colon R/O colitis", received in formalin is a 1.2 x 0.6 x 0.1 cm aggregate of 4 tan-gray tissue fragments.The specimen is submitted in toto in 1 block (1). 2. "Transverse colon R/O colitis", received in formalin is a 1.0 x 0.8 x 0.1 cm aggregate of 5 tan-gray tissue fragments.The specimen is submitted in toto in 1 block (2A). 3. "CBx descending colon R/O colitis", received in formalin is a 1.8 x 0.7 x 0.1 cm aggregate o f multiple gray-tan tissue fragments.The specimen is submitted in toto in 1 block (3A). 4. "CBX rectum R/O colitis", received in formalin is a 1.3 x 0.3 x 0.2 cm aggregate of 5 tan-pink tissue fragments.The specimen is filtered and submitted in toto in 1 block (4A).      [facility] 09/28/2022        Report signed out from the following location(s) [place]. [facility] [address], [place] [address] CLIA #: [id number]  [facility] [address] [place], [place] [address] CLIA #: [id number]       Assessment & Plan:   Problem List Items Addressed This Visit       Other   Depression, recurrent (HCC) - Primary    Not under good control. Will continue her effexor and start wellbutrin. Information regarding counselors given today. Recheck 1 month. Call with any concerns.       Relevant Medications   buPROPion (WELLBUTRIN SR) 150 MG 12 hr tablet   Other Relevant Orders   CBC with  Differential/Platelet   Comprehensive metabolic panel   TSH   VITAMIN D 25 Hydroxy (Vit-D Deficiency, Fractures)   B12   Other Visit Diagnoses     Macrocytosis       Labs drawn today. Await results. Treat as needed.   Relevant Orders   B12        Follow up plan: Return in about 4 weeks (around 12/12/2022).

## 2022-11-14 NOTE — Assessment & Plan Note (Signed)
Not under good control. Will continue her effexor and start wellbutrin. Information regarding counselors given today. Recheck 1 month. Call with any concerns.

## 2022-11-15 LAB — CBC WITH DIFFERENTIAL/PLATELET
Basophils Absolute: 0.1 10*3/uL (ref 0.0–0.2)
Basos: 1 %
EOS (ABSOLUTE): 0.1 10*3/uL (ref 0.0–0.4)
Eos: 1 %
Hematocrit: 40.3 % (ref 34.0–46.6)
Hemoglobin: 13.2 g/dL (ref 11.1–15.9)
Immature Grans (Abs): 0 10*3/uL (ref 0.0–0.1)
Immature Granulocytes: 0 %
Lymphocytes Absolute: 2.3 10*3/uL (ref 0.7–3.1)
Lymphs: 41 %
MCH: 33.2 pg — ABNORMAL HIGH (ref 26.6–33.0)
MCHC: 32.8 g/dL (ref 31.5–35.7)
MCV: 101 fL — ABNORMAL HIGH (ref 79–97)
Monocytes Absolute: 0.3 10*3/uL (ref 0.1–0.9)
Monocytes: 6 %
Neutrophils Absolute: 2.8 10*3/uL (ref 1.4–7.0)
Neutrophils: 51 %
Platelets: 319 10*3/uL (ref 150–450)
RBC: 3.98 x10E6/uL (ref 3.77–5.28)
RDW: 11.4 % — ABNORMAL LOW (ref 11.7–15.4)
WBC: 5.6 10*3/uL (ref 3.4–10.8)

## 2022-11-15 LAB — COMPREHENSIVE METABOLIC PANEL
ALT: 14 [IU]/L (ref 0–32)
AST: 19 [IU]/L (ref 0–40)
Albumin: 4.5 g/dL (ref 3.9–4.9)
Alkaline Phosphatase: 46 [IU]/L (ref 44–121)
BUN/Creatinine Ratio: 23 (ref 9–23)
BUN: 19 mg/dL (ref 6–20)
Bilirubin Total: 0.4 mg/dL (ref 0.0–1.2)
CO2: 22 mmol/L (ref 20–29)
Calcium: 9.3 mg/dL (ref 8.7–10.2)
Chloride: 108 mmol/L — ABNORMAL HIGH (ref 96–106)
Creatinine, Ser: 0.81 mg/dL (ref 0.57–1.00)
Globulin, Total: 2 g/dL (ref 1.5–4.5)
Glucose: 88 mg/dL (ref 70–99)
Potassium: 4.9 mmol/L (ref 3.5–5.2)
Sodium: 141 mmol/L (ref 134–144)
Total Protein: 6.5 g/dL (ref 6.0–8.5)
eGFR: 95 mL/min/{1.73_m2} (ref >59)

## 2022-11-15 LAB — TSH: TSH: 1.91 u[IU]/mL (ref 0.450–4.500)

## 2022-11-15 LAB — VITAMIN B12: Vitamin B-12: 779 pg/mL (ref 232–1245)

## 2022-11-15 LAB — VITAMIN D 25 HYDROXY (VIT D DEFICIENCY, FRACTURES): Vit D, 25-Hydroxy: 43.1 ng/mL (ref 30.0–100.0)

## 2022-11-18 HISTORY — PX: BREAST SURGERY: SHX581

## 2022-11-18 HISTORY — PX: AUGMENTATION MAMMAPLASTY: SUR837

## 2022-11-22 ENCOUNTER — Ambulatory Visit: Payer: 59 | Admitting: Family Medicine

## 2022-11-23 ENCOUNTER — Ambulatory Visit: Payer: 59 | Admitting: Family Medicine

## 2022-11-25 HISTORY — PX: OTHER SURGICAL HISTORY: SHX169

## 2022-11-26 ENCOUNTER — Telehealth: Payer: Self-pay | Admitting: Physician Assistant

## 2022-11-26 MED ORDER — OSELTAMIVIR PHOSPHATE 75 MG PO CAPS: 75.0000 mg | ORAL_CAPSULE | Freq: Every day | ORAL | 0 refills | Status: AC

## 2022-11-26 NOTE — Telephone Encounter (Signed)
Patient's husband seen in urgent care today and tested positive for influenza A.  Patient recently had breast implant surgery.  Husband is interested in her having Tamiflu for prophylaxis so I sent it to the pharmacy.

## 2022-12-12 ENCOUNTER — Encounter: Payer: Self-pay | Admitting: Family Medicine

## 2022-12-12 ENCOUNTER — Ambulatory Visit (INDEPENDENT_AMBULATORY_CARE_PROVIDER_SITE_OTHER): Payer: 59 | Admitting: Family Medicine

## 2022-12-12 VITALS — BP 93/60 | HR 102 | Temp 97.9°F | Resp 12 | Wt 136.4 lb

## 2022-12-12 DIAGNOSIS — F339 Major depressive disorder, recurrent, unspecified: Secondary | ICD-10-CM

## 2022-12-12 MED ORDER — BUPROPION HCL ER (XL) 150 MG PO TB24: 150.0000 mg | ORAL_TABLET | Freq: Every day | ORAL | 1 refills | Status: DC

## 2022-12-12 MED ORDER — VENLAFAXINE HCL ER 150 MG PO CP24: 150.0000 mg | ORAL_CAPSULE | Freq: Every day | ORAL | 1 refills | Status: DC

## 2022-12-12 MED ORDER — TRAZODONE HCL 50 MG PO TABS: ORAL_TABLET | ORAL | 1 refills | Status: DC

## 2022-12-12 NOTE — Assessment & Plan Note (Signed)
Doing much better. Having trouble remembering to take her medicine 2x a day. Will change to long acting wellbutrin and recheck in about 3 months. Call with any concerns. Continue to monitor.

## 2022-12-12 NOTE — Progress Notes (Signed)
BP 93/60 (BP Location: Left Arm, Patient Position: Sitting, Cuff Size: Normal)   Pulse (!) 102   Temp 97.9 F (36.6 C) (Oral)   Resp 12   Wt 136 lb 6.4 oz (61.9 kg)   LMP 12/15/2021 (Exact Date)   SpO2 98%   BMI 21.36 kg/m    Subjective:    Patient ID: Bethany Harrison, female    DOB: 08-Mar-1985, 37 y.o.   MRN: [id number]  HPI: MYRLENE CHEVIS is a 37 y.o. female  Chief Complaint  Patient presents with   Depression    Wellbutrin- A lot better    DEPRESSION Mood status: better Satisfied with current treatment?:  OK for now Symptom severity: moderate  Duration of current treatment :  weeks Side effects: no Medication compliance: excellent compliance Psychotherapy/counseling: yes current Previous psychiatric medications: effexor, wellbutrin Depressed mood: yes Anxious mood: yes Anhedonia: no Significant weight loss or gain: no Insomnia: no  Fatigue: yes Feelings of worthlessness or guilt: no Impaired concentration/indecisiveness: no Suicidal ideations: no Hopelessness: no Crying spells: no    05/31/2022   10:22 AM 03/15/2022    9:31 AM 10/25/2021   11:34 AM 08/02/2021    2:41 PM 05/06/2021    4:05 PM  Depression screen PHQ 2/9  Decreased Interest 1 1 1  0 1  Down, Depressed, Hopeless 2 2 2 1 1   PHQ - 2 Score 3 3 3 1 2   Altered sleeping 2 2 1 2 1   Tired, decreased energy 2 2 1  0 0  Change in appetite 0 1 1 0 0  Feeling bad or failure about yourself  1 0 1 0 0  Trouble concentrating 1 0 1 1 1   Moving slowly or fidgety/restless 2 2 1 1  0  Suicidal thoughts 1 0 0 0 0  PHQ-9 Score 12 10 9 5 4   Difficult doing work/chores Somewhat difficult Somewhat difficult Somewhat difficult Somewhat difficult Not difficult at all      05/31/2022   10:23 AM 03/15/2022    9:31 AM 10/25/2021   11:35 AM 08/02/2021    2:41 PM  GAD 7 : Generalized Anxiety Score  Nervous, Anxious, on Edge 1 1 1 1   Control/stop worrying 2 1 1 1   Worry too much - different things 2 1 1  0  Trouble relaxing  1 1 1  0  Restless 1 0 1 0  Easily annoyed or irritable 1 2 1  0  Afraid - awful might happen 0 2 1 0  Total GAD 7 Score 8 8 7 2   Anxiety Difficulty Somewhat difficult Somewhat difficult Somewhat difficult Not difficult at all      Relevant past medical, surgical, family and social history reviewed and updated as indicated. Interim medical history since our last visit reviewed. Allergies and medications reviewed and updated.  Review of Systems  Constitutional: Negative.   Respiratory: Negative.    Cardiovascular: Negative.   Musculoskeletal: Negative.   Neurological: Negative.   Psychiatric/Behavioral: Negative.      Per HPI unless specifically indicated above     Objective:    BP 93/60 (BP Location: Left Arm, Patient Position: Sitting, Cuff Size: Normal)   Pulse (!) 102   Temp 97.9 F (36.6 C) (Oral)   Resp 12   Wt 136 lb 6.4 oz (61.9 kg)   LMP 12/15/2021 (Exact Date)   SpO2 98%   BMI 21.36 kg/m   Wt Readings from Last 3 Encounters:  12/12/22 136 lb 6.4 oz (61.9 kg)  11/14/22  138 lb (62.6 kg)  09/28/22 131 lb (59.4 kg)    Physical Exam Vitals and nursing note reviewed.  Constitutional:      General: She is not in acute distress.    Appearance: Normal appearance. She is normal weight. She is not ill-appearing, toxic-appearing or diaphoretic.  HENT:     Head: Normocephalic and atraumatic.     Right Ear: External ear normal.     Left Ear: External ear normal.     Nose: Nose normal.     Mouth/Throat:     Mouth: Mucous membranes are moist.     Pharynx: Oropharynx is clear.  Eyes:     General: No scleral icterus.       Right eye: No discharge.        Left eye: No discharge.     Extraocular Movements: Extraocular movements intact.     Conjunctiva/sclera: Conjunctivae normal.     Pupils: Pupils are equal, round, and reactive to light.  Cardiovascular:     Rate and Rhythm: Normal rate and regular rhythm.     Pulses: Normal pulses.     Heart sounds: Normal heart  sounds. No murmur heard.    No friction rub. No gallop.  Pulmonary:     Effort: Pulmonary effort is normal. No respiratory distress.     Breath sounds: Normal breath sounds. No stridor. No wheezing, rhonchi or rales.  Chest:     Chest wall: No tenderness.  Musculoskeletal:        General: Normal range of motion.     Cervical back: Normal range of motion and neck supple.  Skin:    General: Skin is warm and dry.     Capillary Refill: Capillary refill takes less than 2 seconds.     Coloration: Skin is not jaundiced or pale.     Findings: No bruising, erythema, lesion or rash.  Neurological:     General: No focal deficit present.     Mental Status: She is alert and oriented to person, place, and time. Mental status is at baseline.  Psychiatric:        Mood and Affect: Mood normal.        Behavior: Behavior normal.        Thought Content: Thought content normal.        Judgment: Judgment normal.     Results for orders placed or performed in visit on 11/14/22  CBC with Differential/Platelet  Result Value Ref Range   WBC 5.6 3.4 - 10.8 x10E3/uL   RBC 3.98 3.77 - 5.28 x10E6/uL   Hemoglobin 13.2 11.1 - 15.9 g/dL   Hematocrit 52.8 41.3 - 46.6 %   MCV 101 (H) 79 - 97 fL   MCH 33.2 (H) 26.6 - 33.0 pg   MCHC 32.8 31.5 - 35.7 g/dL   RDW 24.4 (L) 01.0 - 27.2 %   Platelets 319 150 - 450 x10E3/uL   Neutrophils 51 Not Estab. %   Lymphs 41 Not Estab. %   Monocytes 6 Not Estab. %   Eos 1 Not Estab. %   Basos 1 Not Estab. %   Neutrophils Absolute 2.8 1.4 - 7.0 x10E3/uL   Lymphocytes Absolute 2.3 0.7 - 3.1 x10E3/uL   Monocytes Absolute 0.3 0.1 - 0.9 x10E3/uL   EOS (ABSOLUTE) 0.1 0.0 - 0.4 x10E3/uL   Basophils Absolute 0.1 0.0 - 0.2 x10E3/uL   Immature Granulocytes 0 Not Estab. %   Immature Grans (Abs) 0.0 0.0 - 0.1 x10E3/uL  Comprehensive metabolic  panel  Result Value Ref Range   Glucose 88 70 - 99 mg/dL   BUN 19 6 - 20 mg/dL   Creatinine, Ser 4.09 0.57 - 1.00 mg/dL   eGFR 95 >81  XB/JYN/8.29   BUN/Creatinine Ratio 23 9 - 23   Sodium 141 134 - 144 mmol/L   Potassium 4.9 3.5 - 5.2 mmol/L   Chloride 108 (H) 96 - 106 mmol/L   CO2 22 20 - 29 mmol/L   Calcium 9.3 8.7 - 10.2 mg/dL   Total Protein 6.5 6.0 - 8.5 g/dL   Albumin 4.5 3.9 - 4.9 g/dL   Globulin, Total 2.0 1.5 - 4.5 g/dL   Bilirubin Total 0.4 0.0 - 1.2 mg/dL   Alkaline Phosphatase 46 44 - 121 IU/L   AST 19 0 - 40 IU/L   ALT 14 0 - 32 IU/L  TSH  Result Value Ref Range   TSH 1.910 0.450 - 4.500 uIU/mL  VITAMIN D 25 Hydroxy (Vit-D Deficiency, Fractures)  Result Value Ref Range   Vit D, 25-Hydroxy 43.1 30.0 - 100.0 ng/mL  B12  Result Value Ref Range   Vitamin B-12 779 232 - 1,245 pg/mL      Assessment & Plan:   Problem List Items Addressed This Visit       Other   Depression, recurrent (HCC) - Primary    Doing much better. Having trouble remembering to take her medicine 2x a day. Will change to long acting wellbutrin and recheck in about 3 months. Call with any concerns. Continue to monitor.       Relevant Medications   buPROPion (WELLBUTRIN XL) 150 MG 24 hr tablet   venlafaxine XR (EFFEXOR-XR) 150 MG 24 hr capsule   traZODone (DESYREL) 50 MG tablet     Follow up plan: Return in about 3 months (around 03/14/2023).

## 2022-12-13 ENCOUNTER — Ambulatory Visit (INDEPENDENT_AMBULATORY_CARE_PROVIDER_SITE_OTHER): Payer: 59 | Admitting: Obstetrics and Gynecology

## 2022-12-13 ENCOUNTER — Encounter: Payer: Self-pay | Admitting: Obstetrics and Gynecology

## 2022-12-13 VITALS — BP 119/88 | HR 114 | Ht 67.0 in | Wt 137.0 lb

## 2022-12-13 DIAGNOSIS — R102 Pelvic and perineal pain: Secondary | ICD-10-CM | POA: Diagnosis not present

## 2022-12-13 NOTE — Progress Notes (Signed)
Patient presents today due to a postoperative concern. She states having a labiaplasty at an outpatient center in [place]. For the last 10 days she has had a vaginal issues and a white coloring internally. She reports being treated for a yeast infection with two diflucan with no change or relief. Surgery center recommended to follow-up with GYN.

## 2022-12-13 NOTE — Progress Notes (Signed)
HPI:      [name] is a 37 y.o. G0P0 who LMP was Patient's last menstrual period was 12/15/2021 (exact date).  Subjective:   She presents today approximately 2 weeks after having a labioplasty and a breast augmentation done concurrently at a different institution.  She states that over the last few days she has noticed that her labia appear white and she became concerned about them not healing correctly.  She has a brief second of pain with urination but does not believe she has a bladder infection.    Hx: The following portions of the patient's history were reviewed and updated as appropriate:             She  has a past medical history of Abdominal pain, Anxiety, Depression, Endometriosis, GERD (gastroesophageal reflux disease), Headache, Hemorrhoid, History of kidney stones, Migraine, PONV (postoperative nausea and vomiting), and Ulcerative colitis (HCC). She does not have any pertinent problems on file. She  has a past surgical history that includes Bunionectomy (08/2008); Tonsillectomy; Dilation and curettage of uterus (2013); Appendectomy (2013); Pelvic laparoscopy (2009); Colonoscopy (2014); Intrauterine device insertion (06/24/2011); Diagnostic laparoscopy; Hernia repair (1989); Chromopertubation (N/A, 06/30/2014); laparoscopy (N/A, 06/30/2014); IUD removal (N/A, 06/30/2014); Breast cyst excision (Right, 2017); Laparoscopic vaginal hysterectomy with salpingectomy (Right, 12/27/2021); IUD removal (12/27/2021); Colonoscopy (N/A, 09/28/2022); biopsy (09/28/2022); and labiaplasty (11/25/2022). Her family history includes Brain cancer in her maternal grandfather; Breast cancer (age of onset: 4) in her mother; Cancer in her mother and paternal grandmother; Diabetes in her maternal grandmother, mother, and paternal grandmother. She  reports that she has never smoked. She has never used smokeless tobacco. She reports current alcohol use. She reports that she does not use drugs. She has a  current medication list which includes the following prescription(s): bupropion, cyclobenzaprine, emgality, ondansetron, topiramate, trazodone, triamcinolone acetonide, venlafaxine xr, and alprazolam. She is allergic to sulfa antibiotics.       Review of Systems:  Review of Systems  Constitutional: Denied constitutional symptoms, night sweats, recent illness, fatigue, fever, insomnia and weight loss.  Eyes: Denied eye symptoms, eye pain, photophobia, vision change and visual disturbance.  Ears/Nose/Throat/Neck: Denied ear, nose, throat or neck symptoms, hearing loss, nasal discharge, sinus congestion and sore throat.  Cardiovascular: Denied cardiovascular symptoms, arrhythmia, chest pain/pressure, edema, exercise intolerance, orthopnea and palpitations.  Respiratory: Denied pulmonary symptoms, asthma, pleuritic pain, productive sputum, cough, dyspnea and wheezing.  Gastrointestinal: Denied, gastro-esophageal reflux, melena, nausea and vomiting.  Genitourinary: See HPI for additional information.  Musculoskeletal: Denied musculoskeletal symptoms, stiffness, swelling, muscle weakness and myalgia.  Dermatologic: Denied dermatology symptoms, rash and scar.  Neurologic: Denied neurology symptoms, dizziness, headache, neck pain and syncope.  Psychiatric: Denied psychiatric symptoms, anxiety and depression.  Endocrine: Denied endocrine symptoms including hot flashes and night sweats.   Meds:   Current Outpatient Medications on File Prior to Visit  Medication Sig Dispense Refill   buPROPion (WELLBUTRIN XL) 150 MG 24 hr tablet Take 1 tablet (150 mg total) by mouth daily. 90 tablet 1   cyclobenzaprine (FLEXERIL) 5 MG tablet Take 1 tablet (5 mg total) by mouth 3 (three) times daily as needed for muscle spasms. 30 tablet 1   Galcanezumab-gnlm (EMGALITY) 120 MG/ML SOAJ Inject 120 mg into the skin every 30 (thirty) days. 1.12 mL 11   ondansetron (ZOFRAN-ODT) 4 MG disintegrating tablet Take 1 tablet (4 mg  total) by mouth every 8 (eight) hours as needed. 20 tablet 3   topiramate (TOPAMAX) 100 MG tablet Take 1 tablet (  100 mg total) by mouth daily. 90 tablet 3   traZODone (DESYREL) 50 MG tablet TAKE 1/2 TO 1 TABLET BY MOUTH AT BEDTIME AS NEEDED FOR SLEEP 90 tablet 1   Triamcinolone Acetonide (NASACORT ALLERGY 24HR NA) Place into the nose.     venlafaxine XR (EFFEXOR-XR) 150 MG 24 hr capsule Take 1 capsule (150 mg total) by mouth daily with breakfast. 90 capsule 1   ALPRAZolam (XANAX) 0.25 MG tablet Take 1 tablet (0.25 mg total) by mouth daily as needed. (Patient not taking: Reported on 12/13/2022) 15 tablet 0   No current facility-administered medications on file prior to visit.      Objective:     Vitals:   12/13/22 0842  BP: 119/88  Pulse: (!) 114   Filed Weights   12/13/22 0842  Weight: 137 lb (62.1 kg)              Vulvar examination reveals both labia appearing to heal perfectly.  Small amount of white exudate noted.  No evidence of infection.  No discharge noted.  All wounds approximated and closed.          Assessment:    G0P0 Patient Active Problem List   Diagnosis Date Noted   Greater trochanteric bursitis of right hip 03/15/2022   Acute left flank pain 10/25/2021   Depression, recurrent (HCC) 03/06/2018   Ulcerative colitis (HCC) 10/05/2017   Migraine without aura and without status migrainosus, not intractable 05/17/2016   Seasonal allergic rhinitis 05/17/2016   Interstitial cystitis 09/04/2014   Endometriosis 06/30/2014     1. Vaginal pain     Recent labioplasty-healing well-no issues noted   Plan:            1.  Recommend sitz bath's 2 times per day with mild soap.  Expect complete healing in a short time. Orders No orders of the defined types were placed in this encounter.   No orders of the defined types were placed in this encounter.     F/U  Return for Annual Physical. I spent 22 minutes involved in the care of this patient preparing to see the  patient by obtaining and reviewing her medical history (including labs, imaging tests and prior procedures), documenting clinical information in the electronic health record (EHR), counseling and coordinating care plans, writing and sending prescriptions, ordering tests or procedures and in direct communicating with the patient and medical staff discussing pertinent items from her history and physical exam.  Elonda Husky, M.D. 12/13/2022 8:53 AM

## 2022-12-19 ENCOUNTER — Encounter: Payer: Self-pay | Admitting: Family Medicine

## 2022-12-20 ENCOUNTER — Ambulatory Visit: Payer: 59 | Admitting: Obstetrics and Gynecology

## 2023-01-30 ENCOUNTER — Encounter: Payer: Self-pay | Admitting: Family Medicine

## 2023-02-06 NOTE — Telephone Encounter (Signed)
Appt (OK to double book virtual tomorrow)

## 2023-02-07 ENCOUNTER — Encounter: Payer: Self-pay | Admitting: Family Medicine

## 2023-02-07 ENCOUNTER — Telehealth (INDEPENDENT_AMBULATORY_CARE_PROVIDER_SITE_OTHER): Payer: 59 | Admitting: Family Medicine

## 2023-02-07 DIAGNOSIS — F339 Major depressive disorder, recurrent, unspecified: Secondary | ICD-10-CM

## 2023-02-07 MED ORDER — BUPROPION HCL ER (XL) 300 MG PO TB24: 300.0000 mg | ORAL_TABLET | Freq: Every day | ORAL | 0 refills | Status: DC

## 2023-02-07 NOTE — Assessment & Plan Note (Signed)
Will increase her wellbutrin to 300mg  daily and recheck in 1 month as scheduled. Call with any concerns.

## 2023-02-07 NOTE — Progress Notes (Signed)
LMP 12/15/2021 (Exact Date)    Subjective:    Patient ID: Bethany Harrison, female    DOB: [birth year], 38 y.o.   MRN: [id number]  HPI: Bethany Harrison is a 38 y.o. female  Chief Complaint  Patient presents with   Mood    Patient says she needs to discuss Wellbutrin prescription up to 2 tablets. Patient says she should like to discuss due to she feels it is not working as effectively lately.    DEPRESSION Duration: chronic Mood status: exacerbated Satisfied with current treatment?: no Symptom severity: moderate  Duration of current treatment : chronic Side effects: no Medication compliance: excellent compliance Psychotherapy/counseling: yes current Previous psychiatric medications: effexor, wellbutrin Depressed mood: yes Anxious mood: yes Anhedonia: no Significant weight loss or gain: no Insomnia: no  Fatigue: yes Feelings of worthlessness or guilt: no Impaired concentration/indecisiveness: yes Suicidal ideations: no Hopelessness: no Crying spells: no    02/07/2023   10:35 AM 05/31/2022   10:22 AM 03/15/2022    9:31 AM 10/25/2021   11:34 AM 08/02/2021    2:41 PM  Depression screen PHQ 2/9  Decreased Interest 3 1 1 1  0  Down, Depressed, Hopeless 3 2 2 2 1   PHQ - 2 Score 6 3 3 3 1   Altered sleeping 3 2 2 1 2   Tired, decreased energy 2 2 2 1  0  Change in appetite 2 0 1 1 0  Feeling bad or failure about yourself  0 1 0 1 0  Trouble concentrating 1 1 0 1 1  Moving slowly or fidgety/restless 3 2 2 1 1   Suicidal thoughts 0 1 0 0 0  PHQ-9 Score 17 12 10 9 5   Difficult doing work/chores Extremely dIfficult Somewhat difficult Somewhat difficult Somewhat difficult Somewhat difficult      02/07/2023   10:37 AM 05/31/2022   10:23 AM 03/15/2022    9:31 AM 10/25/2021   11:35 AM  GAD 7 : Generalized Anxiety Score  Nervous, Anxious, on Edge 2 1 1 1   Control/stop worrying 1 2 1 1   Worry too much - different things 1 2 1 1   Trouble relaxing 2 1 1 1   Restless 2 1 0 1  Easily annoyed  or irritable 2 1 2 1   Afraid - awful might happen 1 0 2 1  Total GAD 7 Score 11 8 8 7   Anxiety Difficulty Very difficult Somewhat difficult Somewhat difficult Somewhat difficult      Relevant past medical, surgical, family and social history reviewed and updated as indicated. Interim medical history since our last visit reviewed. Allergies and medications reviewed and updated.  Review of Systems  Constitutional: Negative.   Respiratory: Negative.    Cardiovascular: Negative.   Gastrointestinal: Negative.   Psychiatric/Behavioral:  Positive for dysphoric mood. Negative for agitation, behavioral problems, confusion, decreased concentration, hallucinations, self-injury, sleep disturbance and suicidal ideas. The patient is nervous/anxious. The patient is not hyperactive.     Per HPI unless specifically indicated above     Objective:    LMP 12/15/2021 (Exact Date)   Wt Readings from Last 3 Encounters:  12/13/22 137 lb (62.1 kg)  12/12/22 136 lb 6.4 oz (61.9 kg)  11/14/22 138 lb (62.6 kg)    Physical Exam Vitals and nursing note reviewed.  Constitutional:      General: She is not in acute distress.    Appearance: Normal appearance. She is not ill-appearing, toxic-appearing or diaphoretic.  HENT:     Head: Normocephalic and atraumatic.  Right Ear: External ear normal.     Left Ear: External ear normal.     Nose: Nose normal.     Mouth/Throat:     Mouth: Mucous membranes are moist.     Pharynx: Oropharynx is clear.  Eyes:     General: No scleral icterus.       Right eye: No discharge.        Left eye: No discharge.     Conjunctiva/sclera: Conjunctivae normal.     Pupils: Pupils are equal, round, and reactive to light.  Pulmonary:     Effort: Pulmonary effort is normal. No respiratory distress.     Comments: Speaking in full sentences Musculoskeletal:        General: Normal range of motion.     Cervical back: Normal range of motion.  Skin:    Coloration: Skin is not  jaundiced or pale.     Findings: No bruising, erythema, lesion or rash.  Neurological:     Mental Status: She is alert and oriented to person, place, and time. Mental status is at baseline.  Psychiatric:        Mood and Affect: Mood normal.        Behavior: Behavior normal.        Thought Content: Thought content normal.        Judgment: Judgment normal.     Results for orders placed or performed in visit on 11/14/22  CBC with Differential/Platelet   Collection Time: 11/14/22  9:16 AM  Result Value Ref Range   WBC 5.6 3.4 - 10.8 x10E3/uL   RBC 3.98 3.77 - 5.28 x10E6/uL   Hemoglobin 13.2 11.1 - 15.9 g/dL   Hematocrit 24.4 01.0 - 46.6 %   MCV 101 (H) 79 - 97 fL   MCH 33.2 (H) 26.6 - 33.0 pg   MCHC 32.8 31.5 - 35.7 g/dL   RDW 27.2 (L) 53.6 - 64.4 %   Platelets 319 150 - 450 x10E3/uL   Neutrophils 51 Not Estab. %   Lymphs 41 Not Estab. %   Monocytes 6 Not Estab. %   Eos 1 Not Estab. %   Basos 1 Not Estab. %   Neutrophils Absolute 2.8 1.4 - 7.0 x10E3/uL   Lymphocytes Absolute 2.3 0.7 - 3.1 x10E3/uL   Monocytes Absolute 0.3 0.1 - 0.9 x10E3/uL   EOS (ABSOLUTE) 0.1 0.0 - 0.4 x10E3/uL   Basophils Absolute 0.1 0.0 - 0.2 x10E3/uL   Immature Granulocytes 0 Not Estab. %   Immature Grans (Abs) 0.0 0.0 - 0.1 x10E3/uL  Comprehensive metabolic panel   Collection Time: 11/14/22  9:16 AM  Result Value Ref Range   Glucose 88 70 - 99 mg/dL   BUN 19 6 - 20 mg/dL   Creatinine, Ser 0.34 0.57 - 1.00 mg/dL   eGFR 95 >74 QV/ZDG/3.87   BUN/Creatinine Ratio 23 9 - 23   Sodium 141 134 - 144 mmol/L   Potassium 4.9 3.5 - 5.2 mmol/L   Chloride 108 (H) 96 - 106 mmol/L   CO2 22 20 - 29 mmol/L   Calcium 9.3 8.7 - 10.2 mg/dL   Total Protein 6.5 6.0 - 8.5 g/dL   Albumin 4.5 3.9 - 4.9 g/dL   Globulin, Total 2.0 1.5 - 4.5 g/dL   Bilirubin Total 0.4 0.0 - 1.2 mg/dL   Alkaline Phosphatase 46 44 - 121 IU/L   AST 19 0 - 40 IU/L   ALT 14 0 - 32 IU/L  TSH   Collection Time:  11/14/22  9:16 AM  Result  Value Ref Range   TSH 1.910 0.450 - 4.500 uIU/mL  VITAMIN D 25 Hydroxy (Vit-D Deficiency, Fractures)   Collection Time: 11/14/22  9:16 AM  Result Value Ref Range   Vit D, 25-Hydroxy 43.1 30.0 - 100.0 ng/mL  B12   Collection Time: 11/14/22  9:16 AM  Result Value Ref Range   Vitamin B-12 779 232 - 1,245 pg/mL      Assessment & Plan:   Problem List Items Addressed This Visit       Other   Depression, recurrent (HCC) - Primary   Will increase her wellbutrin to 300mg  daily and recheck in 1 month as scheduled. Call with any concerns.       Relevant Medications   buPROPion (WELLBUTRIN XL) 300 MG 24 hr tablet     Follow up plan: Return for As scheduled.    This visit was completed via video visit through MyChart due to the restrictions of the COVID-19 pandemic. All issues as above were discussed and addressed. Physical exam was done as above through visual confirmation on video through MyChart. If it was felt that the patient should be evaluated in the office, they were directed there. The patient verbally consented to this visit. Location of the patient: home Location of the provider: work Those involved with this call:  Provider: Olevia Perches, DO CMA: Malen Gauze, CMA Front Desk/Registration:  Servando Snare   Time spent on call:  15 minutes with patient face to face via video conference. More than 50% of this time was spent in counseling and coordination of care. 23 minutes total spent in review of patient's record and preparation of their chart.

## 2023-03-05 ENCOUNTER — Ambulatory Visit
Admission: RE | Admit: 2023-03-05 | Discharge: 2023-03-05 | Disposition: A | Discharge status: Home / Self Care | Payer: 59 | Source: Ambulatory Visit | Attending: Physician Assistant | Admitting: Physician Assistant

## 2023-03-05 VITALS — Ht 67.0 in | Wt 135.0 lb

## 2023-03-05 DIAGNOSIS — G43709 Chronic migraine without aura, not intractable, without status migrainosus: Secondary | ICD-10-CM

## 2023-03-05 MED ORDER — DEXAMETHASONE SODIUM PHOSPHATE 10 MG/ML IJ SOLN
10.0000 mg | Freq: Once | INTRAMUSCULAR | Status: AC
  Administered 2023-03-05: 10 mg via INTRAMUSCULAR

## 2023-03-05 MED ORDER — METOCLOPRAMIDE HCL 5 MG/ML IJ SOLN
10.0000 mg | Freq: Once | INTRAMUSCULAR | Status: AC
  Administered 2023-03-05: 10 mg via INTRAMUSCULAR

## 2023-03-05 MED ORDER — BUTALBITAL-APAP-CAFFEINE 50-325-40 MG PO TABS: 1.0000 | ORAL_TABLET | Freq: Four times a day (QID) | ORAL | 0 refills | Status: AC | PRN

## 2023-03-05 MED ORDER — KETOROLAC TROMETHAMINE 60 MG/2ML IM SOLN
30.0000 mg | Freq: Once | INTRAMUSCULAR | Status: AC
  Administered 2023-03-05: 30 mg via INTRAMUSCULAR

## 2023-03-05 NOTE — ED Provider Notes (Signed)
[facility]    CSN: [id number] Arrival date & time: 03/05/23  1316      History   Chief Complaint Chief Complaint  Patient presents with   Migraine    HPI DULCIE GAMMON is a 38 y.o. female presenting for left sided migraine since this morning. Reports photophobia with nausea. Denies vomiting. No flashes of light or floaters.   Patient has history of migraines x 20 years. Is on Emgality and Topamax. She recently tried a very old prescription of Fioricet x 2 without relief.   Patient reports she often has to get ketorolac, phenergen and benadryl for migraines that are not relived with home meds.   Patient follows with [facility].  HPI  Past Medical History:  Diagnosis Date   Abdominal pain    Anxiety    Depression    Endometriosis    GERD (gastroesophageal reflux disease)    Headache    Hemorrhoid    History of kidney stones    Migraine    PONV (postoperative nausea and vomiting)    Ulcerative colitis [facility])     Patient Active Problem List   Diagnosis Date Noted   Greater trochanteric bursitis of right hip 03/15/2022   Acute left flank pain 10/25/2021   Depression, recurrent (HCC) 03/06/2018   Ulcerative colitis (HCC) 10/05/2017   Migraine without aura and without status migrainosus, not intractable 05/17/2016   Seasonal allergic rhinitis 05/17/2016   Interstitial cystitis 09/04/2014   Endometriosis 06/30/2014    Past Surgical History:  Procedure Laterality Date   APPENDECTOMY  2013   with laparoscopy @ [facility]   BIOPSY  09/28/2022   Procedure: BIOPSY;  Surgeon: [name], DO;  Location: [facility] ENDOSCOPY;  Service: Gastroenterology;;   BREAST CYST EXCISION Right 2017   sebaceous cyst removed   BREAST SURGERY  11/2022   BUNIONECTOMY  08/2008   CHROMOPERTUBATION N/A 06/30/2014   Procedure: CHROMOPERTUBATION;  Surgeon: Herold Harms, MD;  Location: [facility] ORS;  Service: Gynecology;  Laterality:  N/A;   COLONOSCOPY  2014   COLONOSCOPY N/A 09/28/2022   Procedure: COLONOSCOPY;  Surgeon: [name], DO;  Location: [facility] ENDOSCOPY;  Service: Gastroenterology;  Laterality: N/A;   DIAGNOSTIC LAPAROSCOPY     DILATION AND CURETTAGE OF UTERUS  2013   polyp   HERNIA REPAIR  1989   umbilical   INTRAUTERINE DEVICE INSERTION  06/24/2011   Mirena IUD   IUD REMOVAL N/A 06/30/2014   Procedure: INTRAUTERINE DEVICE (IUD) REMOVAL;  Surgeon: Herold Harms, MD;  Location: [facility] ORS;  Service: Gynecology;  Laterality: N/A;   IUD REMOVAL  12/27/2021   Procedure: INTRAUTERINE DEVICE (IUD) REMOVAL;  Surgeon: Linzie Collin, MD;  Location: [facility] ORS;  Service: Gynecology;;   labiaplasty  11/25/2022   [place]   LAPAROSCOPIC VAGINAL HYSTERECTOMY WITH SALPINGECTOMY Right 12/27/2021   Procedure: LAPAROSCOPIC ASSISTED VAGINAL HYSTERECTOMY WITH SALPINGECTOMY;  Surgeon: Linzie Collin, MD;  Location: [facility] ORS;  Service: Gynecology;  Laterality: Right;   LAPAROSCOPY N/A 06/30/2014   Procedure:  with excision of endoemtriosis and biopsy;  Surgeon: Herold Harms, MD;  Location: [facility] ORS;  Service: Gynecology;  Laterality: N/A;   PELVIC LAPAROSCOPY  2009   times 2   TONSILLECTOMY      OB History     Gravida  0   Para      Term      Preterm      AB  Living         SAB      IAB      Ectopic      Multiple      Live Births               Home Medications    Prior to Admission medications   Medication Sig Start Date End Date Taking? Authorizing Provider  ALPRAZolam (XANAX) 0.25 MG tablet Take 0.25 mg by mouth daily as needed. 05/09/21  Yes Johnson, Megan P, DO  buPROPion (WELLBUTRIN XL) 300 MG 24 hr tablet Take 1 tablet (300 mg total) by mouth daily. 02/07/23  Yes Johnson, Megan P, DO  butalbital-acetaminophen-caffeine (FIORICET) 50-325-40 MG tablet Take 1 tablet by mouth every 6 (six) hours as needed for headache or migraine. 03/05/23 03/04/24 Yes Shirlee Latch, PA-C  cyclobenzaprine (FLEXERIL) 5 MG tablet Take 1 tablet (5 mg total) by mouth 3 (three) times daily as needed for muscle spasms. 04/05/22  Yes Johnson, Megan P, DO  Galcanezumab-gnlm (EMGALITY) 120 MG/ML SOAJ Inject 120 mg into the skin every 30 (thirty) days. 05/31/22  Yes Glean Salvo, NP  ondansetron (ZOFRAN-ODT) 4 MG disintegrating tablet Take 1 tablet (4 mg total) by mouth every 8 (eight) hours as needed. 12/14/21  Yes Glean Salvo, NP  topiramate (TOPAMAX) 100 MG tablet Take 1 tablet (100 mg total) by mouth daily. 05/31/22 05/31/23 Yes Glean Salvo, NP  traZODone (DESYREL) 50 MG tablet TAKE 1/2 TO 1 TABLET BY MOUTH AT BEDTIME AS NEEDED FOR SLEEP 12/12/22  Yes Johnson, Megan P, DO  Triamcinolone Acetonide (NASACORT ALLERGY 24HR NA) Place into the nose.   Yes [provider]  venlafaxine XR (EFFEXOR-XR) 150 MG 24 hr capsule Take 1 capsule (150 mg total) by mouth daily with breakfast. 12/12/22  Yes Dorcas Carrow, DO    Family History Family History  Problem Relation Age of Onset   Breast cancer Mother 13   Cancer Mother    Diabetes Mother    Diabetes Maternal Grandmother    Brain cancer Maternal Grandfather    Cancer Paternal Grandmother        colon   Diabetes Paternal Grandmother    Heart disease Neg Hx    Ovarian cancer Neg Hx     Social History Social History   Tobacco Use   Smoking status: Never   Smokeless tobacco: Never  Vaping Use   Vaping status: Never Used  Substance Use Topics   Alcohol use: Yes    Alcohol/week: 0.0 standard drinks of alcohol    Comment: occas   Drug use: No     Allergies   Sulfa antibiotics   Review of Systems Review of Systems  Constitutional:  Negative for fatigue.  Eyes:  Positive for photophobia. Negative for visual disturbance.  Gastrointestinal:  Positive for nausea. Negative for vomiting.  Neurological:  Positive for headaches. Negative for dizziness, syncope, facial asymmetry, weakness,  light-headedness and numbness.  Psychiatric/Behavioral:  Negative for confusion.      Physical Exam Triage Vital Signs ED Triage Vitals  Encounter Vitals Group     BP 03/05/23 1353 (P) 118/83     Systolic BP Percentile --      Diastolic BP Percentile --      Pulse Rate 03/05/23 1353 (P) 66     Resp --      Temp 03/05/23 1353 (P) 98.4 F (36.9 C)     Temp Source 03/05/23 1353 (P) Oral  SpO2 03/05/23 1353 (P) 100 %     Weight 03/05/23 1352 135 lb (61.2 kg)     Height 03/05/23 1352 5\' 7"  (1.702 m)     Head Circumference --      Peak Flow --      Pain Score 03/05/23 1351 8     Pain Loc --      Pain Education --      Exclude from Growth Chart --    No data found.  Updated Vital Signs BP (P) 118/83 (BP Location: Left Arm)   Pulse (P) 66   Temp (P) 98.4 F (36.9 C) (Oral)   Ht 5\' 7"  (1.702 m)   Wt 135 lb (61.2 kg)   LMP 12/15/2021 (Exact Date)   SpO2 (P) 100%   BMI 21.14 kg/m    Physical Exam Vitals and nursing note reviewed.  Constitutional:      General: She is not in acute distress.    Appearance: Normal appearance. She is not ill-appearing or toxic-appearing.  HENT:     Head: Normocephalic and atraumatic.     Nose: Nose normal.     Mouth/Throat:     Mouth: Mucous membranes are moist.     Pharynx: Oropharynx is clear.  Eyes:     General: No scleral icterus.       Right eye: No discharge.        Left eye: No discharge.     Conjunctiva/sclera: Conjunctivae normal.  Cardiovascular:     Rate and Rhythm: Normal rate and regular rhythm.     Heart sounds: Normal heart sounds.  Pulmonary:     Effort: Pulmonary effort is normal. No respiratory distress.     Breath sounds: Normal breath sounds.  Musculoskeletal:     Cervical back: Neck supple.  Skin:    General: Skin is dry.  Neurological:     General: No focal deficit present.     Mental Status: She is alert and oriented to person, place, and time. Mental status is at baseline.     Cranial Nerves: No  cranial nerve deficit.     Motor: No weakness.     Gait: Gait normal.  Psychiatric:        Mood and Affect: Mood normal.        Behavior: Behavior normal.      UC Treatments / Results  Labs (all labs ordered are listed, but only abnormal results are displayed) Labs Reviewed - No data to display  EKG   Radiology No results found.  Procedures Procedures (including critical care time)  Medications Ordered in UC Medications  ketorolac (TORADOL) injection 30 mg (has no administration in time range)  dexamethasone (DECADRON) injection 10 mg (has no administration in time range)  metoCLOPramide (REGLAN) injection 10 mg (10 mg Intramuscular Given 03/05/23 1445)    Initial Impression / Assessment and Plan / UC Course  I have reviewed the triage vital signs and the nursing notes.  Pertinent labs & imaging results that were available during my care of the patient were reviewed by me and considered in my medical decision making (see chart for details).   38 year old female with history of chronic migraine presents for left-sided migraine headache since this morning.  She says current symptoms are consistent with migraines that she gets.  Occasionally, about once a year every couple of years she has to be seen in urgent care for injections.  She has tried home medications which include Emgality, Topamax and Fioricet without  relief.  Follows with [facility] health [place] [facility].  Vitals are all stable and normal.  Exam within normal limits.  No red flag signs or symptoms.  Chronic migraine.  Treating at this time with 60 mg IM ketorolac, 10 mg IM dexamethasone and 10 mg IM Reglan.  Refilled Fioricet as she says this was the only rescue medications that actually works for her.  Advised to continue following up with neurology.  Reviewed return and ED precautions.   Final Clinical Impressions(s) / UC Diagnoses   Final diagnoses:  Chronic migraine w/o aura w/o status migrainosus,  not intractable     Discharge Instructions      -You were given injections of Toradol, dexamethasone and Reglan.  I refilled your Fioricet. - Continue with your home migraine meds and follow-up with neurology as scheduled. - If migraine worsens or is not improved in 24 hours follow-up here again or go to ER.     ED Prescriptions     Medication Sig Dispense Auth. Provider   butalbital-acetaminophen-caffeine (FIORICET) 50-325-40 MG tablet Take 1 tablet by mouth every 6 (six) hours as needed for headache or migraine. 12 tablet Gareth Morgan      PDMP not reviewed this encounter.   Shirlee Latch, PA-C 03/05/23 1447

## 2023-03-05 NOTE — ED Triage Notes (Signed)
Pt c/o vestibular migraines starting on 03/03/23  Pt has taken 2 emergency headache pills and her headache has continued.

## 2023-03-05 NOTE — Discharge Instructions (Addendum)
-  You were given injections of Toradol, dexamethasone and Reglan.  I refilled your Fioricet. - Continue with your home migraine meds and follow-up with neurology as scheduled. - If migraine worsens or is not improved in 24 hours follow-up here again or go to ER.

## 2023-03-14 ENCOUNTER — Ambulatory Visit: Payer: 59 | Admitting: Family Medicine

## 2023-03-14 ENCOUNTER — Encounter: Payer: Self-pay | Admitting: Family Medicine

## 2023-03-14 VITALS — BP 108/74 | HR 85 | Temp 97.6°F | Resp 14 | Wt 139.4 lb

## 2023-03-14 DIAGNOSIS — R253 Fasciculation: Secondary | ICD-10-CM | POA: Diagnosis not present

## 2023-03-14 DIAGNOSIS — F339 Major depressive disorder, recurrent, unspecified: Secondary | ICD-10-CM

## 2023-03-14 MED ORDER — BUPROPION HCL ER (XL) 300 MG PO TB24: 300.0000 mg | ORAL_TABLET | Freq: Every day | ORAL | 1 refills | Status: DC

## 2023-03-14 MED ORDER — ALPRAZOLAM 0.25 MG PO TABS: 0.2500 mg | ORAL_TABLET | Freq: Every day | ORAL | 0 refills | Status: DC | PRN

## 2023-03-14 NOTE — Progress Notes (Signed)
 BP 108/74   Pulse 85   Temp 97.6 F (36.4 C)   Resp 14   Wt 139 lb 6.4 oz (63.2 kg)   LMP 12/15/2021 (Exact Date)   SpO2 99%   BMI 21.83 kg/m    Subjective:    Patient ID: Bethany Harrison, female    DOB: 1985-07-22, 38 y.o.   MRN: [id number]  HPI: Bethany Harrison is a 38 y.o. female  Chief Complaint  Patient presents with   Depression    Going good. Dose increased helped with motivation compared to prior.    DEPRESSION Duration: chronic Mood status: better Satisfied with current treatment?: yes Symptom severity: mild  Duration of current treatment : chronic Side effects: no Medication compliance: excellent compliance Psychotherapy/counseling: yes  Previous psychiatric medications: effexor, wellbutrin Depressed mood: yes Anxious mood: yes Anhedonia: no Significant weight loss or gain: no Insomnia: no  Fatigue: yes Feelings of worthlessness or guilt: no Impaired concentration/indecisiveness: no Suicidal ideations: no Hopelessness: no Crying spells: no    03/14/2023   11:14 AM 02/07/2023   10:35 AM 05/31/2022   10:22 AM 03/15/2022    9:31 AM 10/25/2021   11:34 AM  Depression screen PHQ 2/9  Decreased Interest 1 3 1 1 1   Down, Depressed, Hopeless 1 3 2 2 2   PHQ - 2 Score 2 6 3 3 3   Altered sleeping 0 3 2 2 1   Tired, decreased energy 1 2 2 2 1   Change in appetite 1 2 0 1 1  Feeling bad or failure about yourself  0 0 1 0 1  Trouble concentrating 0 1 1 0 1  Moving slowly or fidgety/restless 1 3 2 2 1   Suicidal thoughts 0 0 1 0 0  PHQ-9 Score 5 17 12 10 9   Difficult doing work/chores Somewhat difficult Extremely dIfficult Somewhat difficult Somewhat difficult Somewhat difficult    Relevant past medical, surgical, family and social history reviewed and updated as indicated. Interim medical history since our last visit reviewed. Allergies and medications reviewed and updated.  Review of Systems  Constitutional: Negative.   Respiratory: Negative.    Cardiovascular:  Negative.   Gastrointestinal: Negative.   Musculoskeletal: Negative.   Psychiatric/Behavioral: Negative.      Per HPI unless specifically indicated above     Objective:    BP 108/74   Pulse 85   Temp 97.6 F (36.4 C)   Resp 14   Wt 139 lb 6.4 oz (63.2 kg)   LMP 12/15/2021 (Exact Date)   SpO2 99%   BMI 21.83 kg/m   Wt Readings from Last 3 Encounters:  03/14/23 139 lb 6.4 oz (63.2 kg)  03/05/23 135 lb (61.2 kg)  12/13/22 137 lb (62.1 kg)    Physical Exam Vitals and nursing note reviewed.  Constitutional:      General: She is not in acute distress.    Appearance: Normal appearance. She is not ill-appearing, toxic-appearing or diaphoretic.  HENT:     Head: Normocephalic and atraumatic.     Right Ear: External ear normal.     Left Ear: External ear normal.     Nose: Nose normal.     Mouth/Throat:     Mouth: Mucous membranes are moist.     Pharynx: Oropharynx is clear.  Eyes:     General: No scleral icterus.       Right eye: No discharge.        Left eye: No discharge.     Extraocular Movements:  Extraocular movements intact.     Conjunctiva/sclera: Conjunctivae normal.     Pupils: Pupils are equal, round, and reactive to light.  Cardiovascular:     Rate and Rhythm: Normal rate and regular rhythm.     Pulses: Normal pulses.     Heart sounds: Normal heart sounds. No murmur heard.    No friction rub. No gallop.  Pulmonary:     Effort: Pulmonary effort is normal. No respiratory distress.     Breath sounds: Normal breath sounds. No stridor. No wheezing, rhonchi or rales.  Chest:     Chest wall: No tenderness.  Musculoskeletal:        General: Normal range of motion.     Cervical back: Normal range of motion and neck supple.  Skin:    General: Skin is warm and dry.     Capillary Refill: Capillary refill takes less than 2 seconds.     Coloration: Skin is not jaundiced or pale.     Findings: No bruising, erythema, lesion or rash.  Neurological:     General: No focal  deficit present.     Mental Status: She is alert and oriented to person, place, and time. Mental status is at baseline.  Psychiatric:        Mood and Affect: Mood normal.        Behavior: Behavior normal.        Thought Content: Thought content normal.        Judgment: Judgment normal.     Results for orders placed or performed in visit on 11/14/22  CBC with Differential/Platelet   Collection Time: 11/14/22  9:16 AM  Result Value Ref Range   WBC 5.6 3.4 - 10.8 x10E3/uL   RBC 3.98 3.77 - 5.28 x10E6/uL   Hemoglobin 13.2 11.1 - 15.9 g/dL   Hematocrit 16.1 09.6 - 46.6 %   MCV 101 (H) 79 - 97 fL   MCH 33.2 (H) 26.6 - 33.0 pg   MCHC 32.8 31.5 - 35.7 g/dL   RDW 04.5 (L) 40.9 - 81.1 %   Platelets 319 150 - 450 x10E3/uL   Neutrophils 51 Not Estab. %   Lymphs 41 Not Estab. %   Monocytes 6 Not Estab. %   Eos 1 Not Estab. %   Basos 1 Not Estab. %   Neutrophils Absolute 2.8 1.4 - 7.0 x10E3/uL   Lymphocytes Absolute 2.3 0.7 - 3.1 x10E3/uL   Monocytes Absolute 0.3 0.1 - 0.9 x10E3/uL   EOS (ABSOLUTE) 0.1 0.0 - 0.4 x10E3/uL   Basophils Absolute 0.1 0.0 - 0.2 x10E3/uL   Immature Granulocytes 0 Not Estab. %   Immature Grans (Abs) 0.0 0.0 - 0.1 x10E3/uL  Comprehensive metabolic panel   Collection Time: 11/14/22  9:16 AM  Result Value Ref Range   Glucose 88 70 - 99 mg/dL   BUN 19 6 - 20 mg/dL   Creatinine, Ser 9.14 0.57 - 1.00 mg/dL   eGFR 95 >78 GN/FAO/1.30   BUN/Creatinine Ratio 23 9 - 23   Sodium 141 134 - 144 mmol/L   Potassium 4.9 3.5 - 5.2 mmol/L   Chloride 108 (H) 96 - 106 mmol/L   CO2 22 20 - 29 mmol/L   Calcium 9.3 8.7 - 10.2 mg/dL   Total Protein 6.5 6.0 - 8.5 g/dL   Albumin 4.5 3.9 - 4.9 g/dL   Globulin, Total 2.0 1.5 - 4.5 g/dL   Bilirubin Total 0.4 0.0 - 1.2 mg/dL   Alkaline Phosphatase 46 44 - 121  IU/L   AST 19 0 - 40 IU/L   ALT 14 0 - 32 IU/L  TSH   Collection Time: 11/14/22  9:16 AM  Result Value Ref Range   TSH 1.910 0.450 - 4.500 uIU/mL  VITAMIN D 25 Hydroxy  (Vit-D Deficiency, Fractures)   Collection Time: 11/14/22  9:16 AM  Result Value Ref Range   Vit D, 25-Hydroxy 43.1 30.0 - 100.0 ng/mL  B12   Collection Time: 11/14/22  9:16 AM  Result Value Ref Range   Vitamin B-12 779 232 - 1,245 pg/mL      Assessment & Plan:   Problem List Items Addressed This Visit       Other   Depression, recurrent (HCC) - Primary   Under good control on current regimen. Continue current regimen. Continue to monitor. Call with any concerns. Refills given. Labs drawn today.        Relevant Medications   ALPRAZolam (XANAX) 0.25 MG tablet   buPROPion (WELLBUTRIN XL) 300 MG 24 hr tablet   Other Visit Diagnoses       Muscle twitch       Will check labs. Await results. Treat as needed.   Relevant Orders   CBC with Differential/Platelet   Ferritin   Magnesium   Phosphorus   VITAMIN D 25 Hydroxy (Vit-D Deficiency, Fractures)   Comprehensive metabolic panel   TSH        Follow up plan: Return for As scheduled.

## 2023-03-14 NOTE — Assessment & Plan Note (Signed)
 Under good control on current regimen. Continue current regimen. Continue to monitor. Call with any concerns. Refills given. Labs drawn today.

## 2023-03-15 ENCOUNTER — Encounter: Payer: Self-pay | Admitting: Obstetrics and Gynecology

## 2023-03-15 LAB — MAGNESIUM: Magnesium: 2.3 mg/dL (ref 1.6–2.3)

## 2023-03-15 LAB — COMPREHENSIVE METABOLIC PANEL
ALT: 14 [IU]/L (ref 0–32)
AST: 12 [IU]/L (ref 0–40)
Albumin: 4.6 g/dL (ref 3.9–4.9)
Alkaline Phosphatase: 53 [IU]/L (ref 44–121)
BUN/Creatinine Ratio: 28 — ABNORMAL HIGH (ref 9–23)
BUN: 22 mg/dL — ABNORMAL HIGH (ref 6–20)
Bilirubin Total: 0.3 mg/dL (ref 0.0–1.2)
CO2: 23 mmol/L (ref 20–29)
Calcium: 9.8 mg/dL (ref 8.7–10.2)
Chloride: 105 mmol/L (ref 96–106)
Creatinine, Ser: 0.8 mg/dL (ref 0.57–1.00)
Globulin, Total: 2 g/dL (ref 1.5–4.5)
Glucose: 95 mg/dL (ref 70–99)
Potassium: 4.6 mmol/L (ref 3.5–5.2)
Sodium: 140 mmol/L (ref 134–144)
Total Protein: 6.6 g/dL (ref 6.0–8.5)
eGFR: 96 mL/min/{1.73_m2} (ref >59)

## 2023-03-15 LAB — CBC WITH DIFFERENTIAL/PLATELET
Basophils Absolute: 0.1 10*3/uL (ref 0.0–0.2)
Basos: 1 %
EOS (ABSOLUTE): 0.1 10*3/uL (ref 0.0–0.4)
Eos: 1 %
Hematocrit: 43.8 % (ref 34.0–46.6)
Hemoglobin: 14.5 g/dL (ref 11.1–15.9)
Immature Grans (Abs): 0 10*3/uL (ref 0.0–0.1)
Immature Granulocytes: 0 %
Lymphocytes Absolute: 2.4 10*3/uL (ref 0.7–3.1)
Lymphs: 41 %
MCH: 33.1 pg — ABNORMAL HIGH (ref 26.6–33.0)
MCHC: 33.1 g/dL (ref 31.5–35.7)
MCV: 100 fL — ABNORMAL HIGH (ref 79–97)
Monocytes Absolute: 0.4 10*3/uL (ref 0.1–0.9)
Monocytes: 6 %
Neutrophils Absolute: 3 10*3/uL (ref 1.4–7.0)
Neutrophils: 51 %
Platelets: 330 10*3/uL (ref 150–450)
RBC: 4.38 x10E6/uL (ref 3.77–5.28)
RDW: 11.4 % — ABNORMAL LOW (ref 11.7–15.4)
WBC: 5.9 10*3/uL (ref 3.4–10.8)

## 2023-03-15 LAB — FERRITIN: Ferritin: 126 ng/mL (ref 15–150)

## 2023-03-15 LAB — TSH: TSH: 1.32 u[IU]/mL (ref 0.450–4.500)

## 2023-03-15 LAB — VITAMIN D 25 HYDROXY (VIT D DEFICIENCY, FRACTURES): Vit D, 25-Hydroxy: 33.9 ng/mL (ref 30.0–100.0)

## 2023-03-15 LAB — PHOSPHORUS: Phosphorus: 2.6 mg/dL — ABNORMAL LOW (ref 3.0–4.3)

## 2023-03-15 NOTE — Telephone Encounter (Signed)
 I contacted the patient via phone, offering 3/6. The patient request to push appointment out a week for after she sees her surgeon. The patient is scheduled for 3/13 with Dr Logan Bores.

## 2023-03-20 ENCOUNTER — Encounter: Payer: Self-pay | Admitting: Family Medicine

## 2023-03-23 ENCOUNTER — Ambulatory Visit: Payer: 59 | Admitting: Obstetrics and Gynecology

## 2023-03-30 ENCOUNTER — Ambulatory Visit: Payer: 59 | Admitting: Obstetrics and Gynecology

## 2023-04-04 ENCOUNTER — Other Ambulatory Visit: Payer: Self-pay | Admitting: Family Medicine

## 2023-04-05 ENCOUNTER — Ambulatory Visit: Payer: 59 | Admitting: Obstetrics and Gynecology

## 2023-04-06 NOTE — Telephone Encounter (Signed)
 Requested Prescriptions  Pending Prescriptions Disp Refills   venlafaxine XR (EFFEXOR-XR) 150 MG 24 hr capsule [Pharmacy Med Name: Venlafaxine HCl ER 150 MG Oral Capsule Extended Release 24 Hour] 90 capsule 0    Sig: TAKE 1 CAPSULE BY MOUTH DAILY  WITH BREAKFAST     Psychiatry: Antidepressants - SNRI - desvenlafaxine & venlafaxine Failed - 04/06/2023  9:08 AM      Failed - Lipid Panel in normal range within the last 12 months    Cholesterol, Total  Date Value Ref Range Status  05/31/2022 201 (H) 100 - 199 mg/dL Final   LDL Chol Calc (NIH)  Date Value Ref Range Status  05/31/2022 130 (H) 0 - 99 mg/dL Final   HDL  Date Value Ref Range Status  05/31/2022 60 >39 mg/dL Final   Triglycerides  Date Value Ref Range Status  05/31/2022 63 0 - 149 mg/dL Final         Passed - Cr in normal range and within 360 days    Creatinine, Ser  Date Value Ref Range Status  03/14/2023 0.80 0.57 - 1.00 mg/dL Final         Passed - Completed PHQ-2 or PHQ-9 in the last 360 days      Passed - Last BP in normal range    BP Readings from Last 1 Encounters:  03/14/23 108/74         Passed - Valid encounter within last 6 months    Recent Outpatient Visits           1 month ago Depression, recurrent (HCC)   [place] [facility] [place], Megan P, DO   3 months ago Depression, recurrent (HCC)   [facility] [place], Megan P, DO   4 months ago Depression, recurrent [facility])   [place] [facility] [place], Megan P, DO   10 months ago Routine general medical examination at a health care facility   [place], Megan P, DO   11 months ago Acute cystitis with hematuria   [place] [facility] Dorcas Carrow, DO       Future Appointments             In 1 month Laural Benes, Oralia Rud, DO  [facility]

## 2023-04-12 ENCOUNTER — Encounter: Payer: Self-pay | Admitting: Obstetrics and Gynecology

## 2023-04-12 ENCOUNTER — Ambulatory Visit (INDEPENDENT_AMBULATORY_CARE_PROVIDER_SITE_OTHER): Admitting: Obstetrics and Gynecology

## 2023-04-12 DIAGNOSIS — Z1231 Encounter for screening mammogram for malignant neoplasm of breast: Secondary | ICD-10-CM

## 2023-04-12 DIAGNOSIS — Z01419 Encounter for gynecological examination (general) (routine) without abnormal findings: Secondary | ICD-10-CM

## 2023-04-12 NOTE — Progress Notes (Signed)
 HPI:      [name] is a 38 y.o. G0P0 who LMP was Patient's last menstrual period was 12/15/2021 (exact date).  Subjective:   She presents today for her annual examination.  She believes that she has entirely recovered from her hysterectomy breast surgery and labioplasty.  She has no complaints. She has found a small lump in her left breast that she has spoken with the office who did her surgery and the nurse practitioner there feels like it might be the edge of the implant.  She has asked me to examine this today. She has had no further issues with UTIs.    Hx: The following portions of the patient's history were reviewed and updated as appropriate:             She  has a past medical history of Abdominal pain, Anxiety, Depression, Endometriosis, GERD (gastroesophageal reflux disease), Headache, Hemorrhoid, History of kidney stones, Migraine, PONV (postoperative nausea and vomiting), and Ulcerative colitis (HCC). She does not have any pertinent problems on file. She  has a past surgical history that includes Bunionectomy (08/2008); Tonsillectomy; Dilation and curettage of uterus (2013); Appendectomy (2013); Pelvic laparoscopy (2009); Colonoscopy (2014); Intrauterine device insertion (06/24/2011); Diagnostic laparoscopy; Hernia repair (1989); Chromopertubation (N/A, 06/30/2014); laparoscopy (N/A, 06/30/2014); IUD removal (N/A, 06/30/2014); Breast cyst excision (Right, 2017); Laparoscopic vaginal hysterectomy with salpingectomy (Right, 12/27/2021); IUD removal (12/27/2021); Colonoscopy (N/A, 09/28/2022); biopsy (09/28/2022); labiaplasty (11/25/2022); and Breast surgery (11/2022). Her family history includes Brain cancer in her maternal grandfather; Breast cancer (age of onset: 42) in her mother; Cancer in her mother and paternal grandmother; Diabetes in her maternal grandmother, mother, and paternal grandmother. She  reports that she has never smoked. She has never used smokeless tobacco. She  reports current alcohol use. She reports that she does not use drugs. She has a current medication list which includes the following prescription(s): bupropion, butalbital-acetaminophen-caffeine, emgality, ondansetron, topiramate, trazodone, triamcinolone acetonide, and venlafaxine xr. She is allergic to sulfa antibiotics.       Review of Systems:  Review of Systems  Constitutional: Denied constitutional symptoms, night sweats, recent illness, fatigue, fever, insomnia and weight loss.  Eyes: Denied eye symptoms, eye pain, photophobia, vision change and visual disturbance.  Ears/Nose/Throat/Neck: Denied ear, nose, throat or neck symptoms, hearing loss, nasal discharge, sinus congestion and sore throat.  Cardiovascular: Denied cardiovascular symptoms, arrhythmia, chest pain/pressure, edema, exercise intolerance, orthopnea and palpitations.  Respiratory: Denied pulmonary symptoms, asthma, pleuritic pain, productive sputum, cough, dyspnea and wheezing.  Gastrointestinal: Denied, gastro-esophageal reflux, melena, nausea and vomiting.  Genitourinary: See HPI for additional information.  Musculoskeletal: Denied musculoskeletal symptoms, stiffness, swelling, muscle weakness and myalgia.  Dermatologic: Denied dermatology symptoms, rash and scar.  Neurologic: Denied neurology symptoms, dizziness, headache, neck pain and syncope.  Psychiatric: Denied psychiatric symptoms, anxiety and depression.  Endocrine: Denied endocrine symptoms including hot flashes and night sweats.   Meds:   Current Outpatient Medications on File Prior to Visit  Medication Sig Dispense Refill   buPROPion (WELLBUTRIN XL) 300 MG 24 hr tablet Take 1 tablet (300 mg total) by mouth daily. 90 tablet 1   butalbital-acetaminophen-caffeine (FIORICET) 50-325-40 MG tablet Take 1 tablet by mouth every 6 (six) hours as needed for headache or migraine. 12 tablet 0   Galcanezumab-gnlm (EMGALITY) 120 MG/ML SOAJ Inject 120 mg into the skin  every 30 (thirty) days. 1.12 mL 11   ondansetron (ZOFRAN-ODT) 4 MG disintegrating tablet Take 1 tablet (4 mg total) by mouth every 8 (eight) hours  as needed. 20 tablet 3   topiramate (TOPAMAX) 100 MG tablet Take 1 tablet (100 mg total) by mouth daily. 90 tablet 3   traZODone (DESYREL) 50 MG tablet TAKE 1/2 TO 1 TABLET BY MOUTH AT BEDTIME AS NEEDED FOR SLEEP 90 tablet 1   Triamcinolone Acetonide (NASACORT ALLERGY 24HR NA) Place into the nose.     venlafaxine XR (EFFEXOR-XR) 150 MG 24 hr capsule TAKE 1 CAPSULE BY MOUTH DAILY  WITH BREAKFAST 90 capsule 0   No current facility-administered medications on file prior to visit.     Objective:     There were no vitals filed for this visit.  There were no vitals filed for this visit.            Physical examination General NAD, Conversant  HEENT Atraumatic; Op clear with mmm.  Normo-cephalic.  Anicteric sclerae  Thyroid/Neck Smooth without nodularity or enlargement. Normal ROM.  Neck Supple.  Skin No rashes, lesions or ulceration. Normal palpated skin turgor. No nodularity.  Breasts: Very small 0.25 cm mass located directly below her incision.  Mildly tender to firm palpation.  Mobile symmetric.  No axillary adenopathy.  Lungs: Clear to auscultation.No rales or wheezes. Normal Respiratory effort, no retractions.  Heart: NSR.  No murmurs or rubs appreciated. No peripheral edema  Abdomen: Soft.  Non-tender.  No masses.  No HSM. No hernia  Extremities: Moves all appropriately.  Normal ROM for age. No lymphadenopathy.  Neuro: Oriented to PPT.  Normal mood. Normal affect.   Patient deferred pelvic exam as she is asymptomatic  Assessment:    G0P0 Patient Active Problem List   Diagnosis Date Noted   Greater trochanteric bursitis of right hip 03/15/2022   Acute left flank pain 10/25/2021   Depression, recurrent (HCC) 03/06/2018   Ulcerative colitis (HCC) 10/05/2017   Migraine without aura and without status migrainosus, not intractable  05/17/2016   Seasonal allergic rhinitis 05/17/2016   Interstitial cystitis 09/04/2014   Endometriosis 06/30/2014     1. Well woman exam with routine gynecological exam   2. Screening mammogram for breast cancer     Small breast nodule underneath her incision on the left breast.  I do not believe it is the edge of the implant, it feels more like scar tissue in the incision.  Doubt breast cancer   Plan:            1.  Basic Screening Recommendations The basic screening recommendations for asymptomatic women were discussed with the patient during her visit.  The age-appropriate recommendations were discussed with her and the rational for the tests reviewed.  When I am informed by the patient that another primary care physician has previously obtained the age-appropriate tests and they are up-to-date, only outstanding tests are ordered and referrals given as necessary.  Abnormal results of tests will be discussed with her when all of her results are completed.  Routine preventative health maintenance measures emphasized: Exercise/Diet/Weight control, Tobacco Warnings, Alcohol/Substance use risks and Stress Management Recommend mammogram/sonogram to further examine the mass in the left breast. Orders Orders Placed This Encounter  Procedures   MM DIGITAL SCREENING BILATERAL    No orders of the defined types were placed in this encounter.        F/U  Return in about 1 year (around 04/11/2024) for Annual Physical.  Elonda Husky, M.D. 04/12/2023 9:54 AM

## 2023-04-12 NOTE — Progress Notes (Signed)
 Patients presents for annual exam today. She states doing well since surgery and her trouble with UTI's has now resolved. History of hysterectomy.  Due for mammogram in June, ordered. Annual labs are completed by PCP. She states no other questions or concerns at this time.

## 2023-04-25 ENCOUNTER — Ambulatory Visit (INDEPENDENT_AMBULATORY_CARE_PROVIDER_SITE_OTHER): Payer: 59 | Admitting: Neurology

## 2023-04-25 ENCOUNTER — Encounter: Payer: Self-pay | Admitting: Neurology

## 2023-04-25 VITALS — BP 110/75 | HR 94 | Ht 66.5 in | Wt 138.0 lb

## 2023-04-25 DIAGNOSIS — G43009 Migraine without aura, not intractable, without status migrainosus: Secondary | ICD-10-CM

## 2023-04-25 MED ORDER — EMGALITY 120 MG/ML ~~LOC~~ SOAJ: 120.0000 mg | SUBCUTANEOUS | 11 refills | Status: AC

## 2023-04-25 MED ORDER — ONDANSETRON 4 MG PO TBDP: 4.0000 mg | ORAL_TABLET | Freq: Three times a day (TID) | ORAL | 3 refills | Status: AC | PRN

## 2023-04-25 MED ORDER — NURTEC 75 MG PO TBDP: 75.0000 mg | ORAL_TABLET | ORAL | 11 refills | Status: AC | PRN

## 2023-04-25 MED ORDER — TOPIRAMATE 25 MG PO TABS: 75.0000 mg | ORAL_TABLET | Freq: Every day | ORAL | 5 refills | Status: DC

## 2023-04-25 NOTE — Progress Notes (Signed)
 Patient: Bethany Harrison Date of Birth: [birth year]  Reason for Visit: Follow up History from: Patient Primary Neurologist: Dr. Delena Bali  ASSESSMENT AND PLAN 38 y.o. year old female   1.  Chronic migraine headaches with vertigo symptoms -Continue Emgality for migraine prevention, also on Topamax 100 mg daily, on Effexor from another provider. Not planning pregnancy, has had hysterectomy.  -Wean off Topamax by 25 mg monthly until off, see if improvement in brain fog, however cognitive symptoms could be due to stress/depression  -Try Nurtec at onset of migraine, may combine with Zofran  -Try Zomig 5 mg nasal spray for migraine treatment (prior insurance wouldn't cover, but has a new plan), has meclizine if needed for dizziness -Previously tried and failed: Relpax, Zomig: (insurance would not cover), Topamax, Effexor, amitriptyline, Ajovy,  Imitrex, Maxalt, Fioricet, Zofran -Next steps: Would off small supply of Fioricet to have on hand for severe migraine, other rescue medications have not been effective or covered  -Follow-up in 6 months or sooner if needed VV  HISTORY Copied 08/25/21 Dr. Delena Bali: The patient presents for evaluation of headaches and dizziness. She has had migraines since her early 80s. She has had dizziness for the past 6-12 months, but it has worsened in the past 3 months. Currently she has episodes of dizziness twice per week. Describes this as both a sensation of lightheadedness and like her body is moving. She will feel uncoordinated and trip over her feet. She will have associated fullness and ringing in her ears. Typically she will get a dizzy spell, then develop a migraine. She will have some form of a headache >50% of the time she has dizziness. Currently she has 1-2 headaches per week.   She was started on meclizine which helps but takes several hours to start working. Has tried multiple migraine medications previously. She is not sure if they were helpful but notes she did not  have vertigo at the time she was using them.   Headache History: Onset: early 40s Triggers: menstrual cycle Aura: none Location: unilateral Quality/Description: throbbing Associated Symptoms:             Photophobia: yes             Phonophobia: yes             Nausea: yes Vomiting: no Other symptoms: vertigo Worse with activity?: yes Duration of headaches: several hours   Headache days per month: 8 Headache free days per month: 22   Current Treatment: Abortive Dramamine meclizine   Preventative Topamax 100 mg QHS Effexor 150 mg daily   Prior Therapies                                 Topamax 100 mg QHS Effexor amitriptyline Emgality Ajovy Nurtec 75 mg - lack of efficacy Imitrex - lack of efficacy Maxalt Fioricet Zofran   Update December 14, 2021 SS: This month is her 3rd Emgality, it is helping. Is due next week for injection. Yesterday dizziness, took meclizine with good benefit. Zomig didn't work but nasal spray was back ordered so tried tablets, eletriptan was expensive for 2 pills. Had Fioricet she took today, it went away. Over the last 3 weeks, only 1 spell needed rescue. Wants to hold off on MRI right now.   Update Jun 02, 2022 SS: remains on [place], had hysterectomy in December. Went to hormone doctor, her testosterone was low, this 1st month felt  amazing, had no dizziness! The dizziness has come back somewhat, with mild headache. Has not had any severe headaches. Dizziness is 4-5 days a month, used to be 3 weeks a month. On Topamax 100 mg.  Overall doing a lot better.  Update April 25, 2023 SS: having about 2 migraines a month, on Emgality, had to go to ER in Feb for dizziness with migraine, Toradol steroid, reglan IM injection. Insurance wouldn't cover Zomig. Has been given prescription for Fioricet, rarely takes. Mentions brain fog, going on for a long time. Sometimes feels like fine tremor in hands. Just started Wellbutrin for depression, is getting better.  PCP did labs in Oct 2024, TSH 1.910.  REVIEW OF SYSTEMS: Out of a complete 14 system review of symptoms, the patient complains only of the following symptoms, and all other reviewed systems are negative.  See HPI  ALLERGIES: Allergies  Allergen Reactions   Sulfa Antibiotics Nausea Only    HOME MEDICATIONS: Outpatient Medications Prior to Visit  Medication Sig Dispense Refill   buPROPion (WELLBUTRIN XL) 300 MG 24 hr tablet Take 1 tablet (300 mg total) by mouth daily. 90 tablet 1   butalbital-acetaminophen-caffeine (FIORICET) 50-325-40 MG tablet Take 1 tablet by mouth every 6 (six) hours as needed for headache or migraine. 12 tablet 0   Galcanezumab-gnlm (EMGALITY) 120 MG/ML SOAJ Inject 120 mg into the skin every 30 (thirty) days. 1.12 mL 11   ondansetron (ZOFRAN-ODT) 4 MG disintegrating tablet Take 1 tablet (4 mg total) by mouth every 8 (eight) hours as needed. 20 tablet 3   topiramate (TOPAMAX) 100 MG tablet Take 1 tablet (100 mg total) by mouth daily. 90 tablet 3   traZODone (DESYREL) 50 MG tablet TAKE 1/2 TO 1 TABLET BY MOUTH AT BEDTIME AS NEEDED FOR SLEEP 90 tablet 1   Triamcinolone Acetonide (NASACORT ALLERGY 24HR NA) Place into the nose.     venlafaxine XR (EFFEXOR-XR) 150 MG 24 hr capsule TAKE 1 CAPSULE BY MOUTH DAILY  WITH BREAKFAST 90 capsule 0   No facility-administered medications prior to visit.    PAST MEDICAL HISTORY: Past Medical History:  Diagnosis Date   Abdominal pain    Anxiety    Depression    Endometriosis    GERD (gastroesophageal reflux disease)    Headache    Hemorrhoid    History of kidney stones    Migraine    PONV (postoperative nausea and vomiting)    Ulcerative colitis (HCC)     PAST SURGICAL HISTORY: Past Surgical History:  Procedure Laterality Date   APPENDECTOMY  2013   with laparoscopy @ [facility]   BIOPSY  09/28/2022   Procedure: BIOPSY;  Surgeon: [name], DO;  Location: [facility] ENDOSCOPY;  Service: Gastroenterology;;    BREAST CYST EXCISION Right 2017   sebaceous cyst removed   BREAST SURGERY  11/2022   BUNIONECTOMY  08/2008   CHROMOPERTUBATION N/A 06/30/2014   Procedure: CHROMOPERTUBATION;  Surgeon: Herold Harms, MD;  Location: [facility] ORS;  Service: Gynecology;  Laterality: N/A;   COLONOSCOPY  2014   COLONOSCOPY N/A 09/28/2022   Procedure: COLONOSCOPY;  Surgeon: [name], DO;  Location: [facility] ENDOSCOPY;  Service: Gastroenterology;  Laterality: N/A;   DIAGNOSTIC LAPAROSCOPY     DILATION AND CURETTAGE OF UTERUS  2013   polyp   HERNIA REPAIR  1989   umbilical   INTRAUTERINE DEVICE INSERTION  06/24/2011   Mirena IUD   IUD REMOVAL N/A 06/30/2014   Procedure: INTRAUTERINE DEVICE (IUD) REMOVAL;  Surgeon: Herold Harms, MD;  Location: [facility] ORS;  Service: Gynecology;  Laterality: N/A;   IUD REMOVAL  12/27/2021   Procedure: INTRAUTERINE DEVICE (IUD) REMOVAL;  Surgeon: Linzie Collin, MD;  Location: [facility] ORS;  Service: Gynecology;;   labiaplasty  11/25/2022   [place]   LAPAROSCOPIC VAGINAL HYSTERECTOMY WITH SALPINGECTOMY Right 12/27/2021   Procedure: LAPAROSCOPIC ASSISTED VAGINAL HYSTERECTOMY WITH SALPINGECTOMY;  Surgeon: Linzie Collin, MD;  Location: [facility] ORS;  Service: Gynecology;  Laterality: Right;   LAPAROSCOPY N/A 06/30/2014   Procedure:  with excision of endoemtriosis and biopsy;  Surgeon: Herold Harms, MD;  Location: [facility] ORS;  Service: Gynecology;  Laterality: N/A;   PELVIC LAPAROSCOPY  2009   times 2   TONSILLECTOMY      FAMILY HISTORY: Family History  Problem Relation Age of Onset   Breast cancer Mother 40   Cancer Mother    Diabetes Mother    Diabetes Maternal Grandmother    Brain cancer Maternal Grandfather    Cancer Paternal Grandmother        colon   Diabetes Paternal Grandmother    Heart disease Neg Hx    Ovarian cancer Neg Hx     SOCIAL HISTORY: Social History   Socioeconomic History   Marital status: Married    Spouse name: Not on  file   Number of children: 0   Years of education: Not on file   Highest education level: Bachelor's degree (e.g., BA, AB, BS)  Occupational History   Occupation: HR    Employer: [employer]  Tobacco Use   Smoking status: Never   Smokeless tobacco: Never  Vaping Use   Vaping status: Never Used  Substance and Sexual Activity   Alcohol use: Yes    Alcohol/week: 0.0 standard drinks of alcohol    Comment: occas   Drug use: No   Sexual activity: Not Currently    Partners: Male    Birth control/protection: Surgical    Comment: hysterectomy  Other Topics Concern   Not on file  Social History Narrative   Lives with husband   Caffeine- soda maybe one a day   Social Drivers of Corporate investment banker Strain: Low Risk  (03/10/2023)   Overall Financial Resource Strain (CARDIA)    Difficulty of Paying Living Expenses: Not hard at all  Food Insecurity: No Food Insecurity (03/10/2023)   Hunger Vital Sign    Worried About Running Out of Food in the Last Year: Never true    Ran Out of Food in the Last Year: Never true  Transportation Needs: No Transportation Needs (03/10/2023)   PRAPARE - Administrator, Civil Service (Medical): No    Lack of Transportation (Non-Medical): No  Physical Activity: Insufficiently Active (03/10/2023)   Exercise Vital Sign    Days of Exercise per Week: 2 days    Minutes of Exercise per Session: 30 min  Stress: Stress Concern Present (03/10/2023)   [employer] of Occupational Health - Occupational Stress Questionnaire    Feeling of Stress : To some extent  Social Connections: Moderately Isolated (03/10/2023)   Social Connection and Isolation Panel [NHANES]    Frequency of Communication with Friends and Family: Twice a week    Frequency of Social Gatherings with Friends and Family: Once a week    Attends Religious Services: Never    Database administrator or Organizations: No    Attends Banker Meetings: Not on file  Marital Status: Married  Catering manager Violence: Not on file    PHYSICAL EXAM  Vitals:   04/25/23 1049  BP: 110/75  Pulse: 94  Weight: 138 lb (62.6 kg)  Height: 5' 6.5" (1.689 m)   Body mass index is 21.94 kg/m.  Generalized: Well developed, in no acute distress  Neurological examination  Mentation: Alert oriented to time, place, history taking. Follows all commands speech and language fluent Cranial nerve II-XII: Pupils were equal round reactive to light. Extraocular movements were full, visual field were full on confrontational test. Facial sensation and strength were normal. Head turning and shoulder shrug  were normal and symmetric. Motor: The motor testing reveals 5 over 5 strength of all 4 extremities. Good symmetric motor tone is noted throughout.  Sensory: Sensory testing is intact to soft touch on all 4 extremities. No evidence of extinction is noted.  Coordination: Cerebellar testing reveals good finger-nose-finger and heel-to-shin bilaterally.  Gait and station: Gait is normal.  Reflexes: Deep tendon reflexes are symmetric and normal bilaterally.   DIAGNOSTIC DATA (LABS, IMAGING, TESTING) - I reviewed patient records, labs, notes, testing and imaging myself where available.  Lab Results  Component Value Date   WBC 5.9 03/14/2023   HGB 14.5 03/14/2023   HCT 43.8 03/14/2023   MCV 100 (H) 03/14/2023   PLT 330 03/14/2023      Component Value Date/Time   NA 140 03/14/2023 1130   K 4.6 03/14/2023 1130   CL 105 03/14/2023 1130   CO2 23 03/14/2023 1130   GLUCOSE 95 03/14/2023 1130   BUN 22 (H) 03/14/2023 1130   CREATININE 0.80 03/14/2023 1130   CALCIUM 9.8 03/14/2023 1130   PROT 6.6 03/14/2023 1130   ALBUMIN 4.6 03/14/2023 1130   AST 12 03/14/2023 1130   ALT 14 03/14/2023 1130   ALKPHOS 53 03/14/2023 1130   BILITOT 0.3 03/14/2023 1130   Lab Results  Component Value Date   CHOL 201 (H) 05/31/2022   HDL 60 05/31/2022   LDLCALC 130 (H) 05/31/2022   TRIG  63 05/31/2022   No results found for: "HGBA1C" Lab Results  Component Value Date   VITAMINB12 779 11/14/2022   Lab Results  Component Value Date   TSH 1.320 03/14/2023    Margie Ege, AGNP-C, DNP 04/25/2023, 10:55 AM [facility] [address], [address] [place], [place] [address] [phone]

## 2023-04-25 NOTE — Patient Instructions (Signed)
 Reduce Topamax by 25 mg monthly until off, if headaches increase, continue at current dose. Continue other medications, try Nurtec for acute headache relief. Take 1 tablet at onset of headache, max is 1 tablet in 24 hours.

## 2023-04-28 ENCOUNTER — Other Ambulatory Visit (HOSPITAL_COMMUNITY): Payer: Self-pay

## 2023-04-28 ENCOUNTER — Telehealth: Payer: Self-pay

## 2023-04-28 NOTE — Telephone Encounter (Signed)
 Pharmacy Patient Advocate Encounter   Received notification from Fax that prior authorization for Nurtec 75MG  dispersible tablets is required/requested.   Insurance verification completed.   The patient is insured through [facility] .   Per test claim: PA required; PA submitted to above mentioned insurance via CoverMyMeds Key/confirmation #/EOC [facility] Status is pending

## 2023-05-03 ENCOUNTER — Other Ambulatory Visit (HOSPITAL_COMMUNITY): Payer: Self-pay

## 2023-05-03 NOTE — Telephone Encounter (Signed)
 Pharmacy Patient Advocate Encounter  Received notification from [facility] that Prior Authorization for Nurtec 75MG  dispersible tablets has been APPROVED from 04/28/2023 to 04/27/2024. Unable to obtain price due to refill too soon rejection, last fill date 05/01/2023 next available fill date6/22/2025   PA #/Case ID/Reference #: PA Case ID #: [id number]

## 2023-06-02 ENCOUNTER — Encounter: Payer: Self-pay | Admitting: Family Medicine

## 2023-06-05 ENCOUNTER — Ambulatory Visit

## 2023-06-07 ENCOUNTER — Ambulatory Visit: Admitting: Family Medicine

## 2023-06-07 ENCOUNTER — Encounter: Payer: Self-pay | Admitting: Family Medicine

## 2023-06-07 ENCOUNTER — Ambulatory Visit: Attending: Family Medicine

## 2023-06-07 VITALS — BP 109/74 | HR 90 | Ht 66.5 in | Wt 140.0 lb

## 2023-06-07 DIAGNOSIS — F339 Major depressive disorder, recurrent, unspecified: Secondary | ICD-10-CM | POA: Diagnosis not present

## 2023-06-07 DIAGNOSIS — R000 Tachycardia, unspecified: Secondary | ICD-10-CM

## 2023-06-07 DIAGNOSIS — Z0000 Encounter for general adult medical examination without abnormal findings: Secondary | ICD-10-CM

## 2023-06-07 DIAGNOSIS — Z1231 Encounter for screening mammogram for malignant neoplasm of breast: Secondary | ICD-10-CM | POA: Diagnosis not present

## 2023-06-07 MED ORDER — BUPROPION HCL ER (XL) 450 MG PO TB24
450.0000 mg | ORAL_TABLET | Freq: Every day | ORAL | 0 refills | Status: AC
Start: 2023-06-07 — End: ?

## 2023-06-07 NOTE — Progress Notes (Signed)
 BP 109/74 (BP Location: Left Arm, Patient Position: Sitting, Cuff Size: Normal)   Pulse 90   Ht 5' 6.5" (1.689 m)   Wt 140 lb (63.5 kg)   LMP 12/15/2021 (Exact Date)   SpO2 97%   BMI 22.26 kg/m    Subjective:    Patient ID: Bethany Harrison, female    DOB: 1985/05/03, 38 y.o.   MRN: [id number]  HPI: Bethany Harrison is a 38 y.o. female presenting on 06/07/2023 for comprehensive medical examination. Current medical complaints include:  Having some issues with her memory. Has been doing OK with her migraines.   DEPRESSION Mood status: uncontrolled Satisfied with current treatment?: no Symptom severity: moderate  Duration of current treatment : chronic Side effects: no Medication compliance: excellent compliance Psychotherapy/counseling: yes  Previous psychiatric medications: wellbutrin , effexor  Depressed mood: yes Anxious mood: yes Anhedonia: no Significant weight loss or gain: no Insomnia: no  Fatigue: yes Feelings of worthlessness or guilt: yes Impaired concentration/indecisiveness: yes Suicidal ideations: no Hopelessness: no Crying spells: no    06/07/2023   10:36 AM 03/14/2023   11:14 AM 02/07/2023   10:35 AM 05/31/2022   10:22 AM 03/15/2022    9:31 AM  Depression screen PHQ 2/9  Decreased Interest 3 1 3 1 1   Down, Depressed, Hopeless 2 1 3 2 2   PHQ - 2 Score 5 2 6 3 3   Altered sleeping 1 0 3 2 2   Tired, decreased energy 1 1 2 2 2   Change in appetite 1 1 2  0 1  Feeling bad or failure about yourself  1 0 0 1 0  Trouble concentrating 1 0 1 1 0  Moving slowly or fidgety/restless 2 1 3 2 2   Suicidal thoughts 0 0 0 1 0  PHQ-9 Score 12 5 17 12 10   Difficult doing work/chores Very difficult Somewhat difficult Extremely dIfficult Somewhat difficult Somewhat difficult   PALPITATIONS Duration: weeks Symptom description: doesn't feel anything- but her watch and aura ring has picked up HR up to 150s Duration of episode: unknown Frequency: recurrentl Activity when event  occurred: at random Related to exertion: no Dyspnea: no Chest pain: no Syncope: no Anxiety/stress: yes Nausea/vomiting: no Diaphoresis: no Coronary artery disease: no Congestive heart failure: no Arrhythmia:no Thyroid disease: no Caffeine  intake: 1 cafienated beverage Status:  stable Treatments attempted:none   She currently lives with: husband Menopausal Symptoms: no  Depression Screen done today and results listed below:     06/07/2023   10:36 AM 03/14/2023   11:14 AM 02/07/2023   10:35 AM 05/31/2022   10:22 AM 03/15/2022    9:31 AM  Depression screen PHQ 2/9  Decreased Interest 3 1 3 1 1   Down, Depressed, Hopeless 2 1 3 2 2   PHQ - 2 Score 5 2 6 3 3   Altered sleeping 1 0 3 2 2   Tired, decreased energy 1 1 2 2 2   Change in appetite 1 1 2  0 1  Feeling bad or failure about yourself  1 0 0 1 0  Trouble concentrating 1 0 1 1 0  Moving slowly or fidgety/restless 2 1 3 2 2   Suicidal thoughts 0 0 0 1 0  PHQ-9 Score 12 5 17 12 10   Difficult doing work/chores Very difficult Somewhat difficult Extremely dIfficult Somewhat difficult Somewhat difficult    Past Medical History:  Past Medical History:  Diagnosis Date   Abdominal pain    Allergy 2011   Anxiety    Depression  Endometriosis    GERD (gastroesophageal reflux disease)    Headache    Hemorrhoid    History of kidney stones    Migraine    PONV (postoperative nausea and vomiting)    Ulcerative colitis [facility])     Surgical History:  Past Surgical History:  Procedure Laterality Date   ABDOMINAL HYSTERECTOMY  2023   APPENDECTOMY  2013   with laparoscopy @ [facility]   BIOPSY  09/28/2022   Procedure: BIOPSY;  Surgeon: Quintin Buckle, DO;  Location: [facility] ENDOSCOPY;  Service: Gastroenterology;;   BREAST CYST EXCISION Right 2017   sebaceous cyst removed   BREAST SURGERY  11/2022   BUNIONECTOMY  08/2008   CHROMOPERTUBATION N/A 06/30/2014   Procedure: CHROMOPERTUBATION;  Surgeon: Colan Dash, MD;   Location: [facility] ORS;  Service: Gynecology;  Laterality: N/A;   COLONOSCOPY  2014   COLONOSCOPY N/A 09/28/2022   Procedure: COLONOSCOPY;  Surgeon: Quintin Buckle, DO;  Location: [facility] ENDOSCOPY;  Service: Gastroenterology;  Laterality: N/A;   COSMETIC SURGERY  2024   Breast   DIAGNOSTIC LAPAROSCOPY     DILATION AND CURETTAGE OF UTERUS  2013   polyp   HERNIA REPAIR  1989   umbilical   INTRAUTERINE DEVICE INSERTION  06/24/2011   Mirena  IUD   IUD REMOVAL N/A 06/30/2014   Procedure: INTRAUTERINE DEVICE (IUD) REMOVAL;  Surgeon: Colan Dash, MD;  Location: [facility] ORS;  Service: Gynecology;  Laterality: N/A;   IUD REMOVAL  12/27/2021   Procedure: INTRAUTERINE DEVICE (IUD) REMOVAL;  Surgeon: Zenobia Hila, MD;  Location: [facility] ORS;  Service: Gynecology;;   labiaplasty  11/25/2022   [place]   LAPAROSCOPIC VAGINAL HYSTERECTOMY WITH SALPINGECTOMY Right 12/27/2021   Procedure: LAPAROSCOPIC ASSISTED VAGINAL HYSTERECTOMY WITH SALPINGECTOMY;  Surgeon: Zenobia Hila, MD;  Location: [facility] ORS;  Service: Gynecology;  Laterality: Right;   LAPAROSCOPY N/A 06/30/2014   Procedure:  with excision of endoemtriosis and biopsy;  Surgeon: Colan Dash, MD;  Location: [facility] ORS;  Service: Gynecology;  Laterality: N/A;   PELVIC LAPAROSCOPY  2009   times 2   TONSILLECTOMY      Medications:  Current Outpatient Medications on File Prior to Visit  Medication Sig   butalbital -acetaminophen -caffeine  (FIORICET) 50-325-40 MG tablet Take 1 tablet by mouth every 6 (six) hours as needed for headache or migraine.   Galcanezumab -gnlm (EMGALITY ) 120 MG/ML SOAJ Inject 120 mg into the skin every 30 (thirty) days.   ondansetron  (ZOFRAN -ODT) 4 MG disintegrating tablet Take 1 tablet (4 mg total) by mouth every 8 (eight) hours as needed.   Rimegepant Sulfate (NURTEC) 75 MG TBDP Take 1 tablet (75 mg total) by mouth as needed.   topiramate  (TOPAMAX ) 25 MG tablet Take 3 tablets (75 mg total) by mouth daily.    Triamcinolone  Acetonide (NASACORT  ALLERGY 24HR NA) Place into the nose.   No current facility-administered medications on file prior to visit.    Allergies:  Allergies  Allergen Reactions   Sulfa Antibiotics Nausea Only    Social History:  Social History   Socioeconomic History   Marital status: Married    Spouse name: Not on file   Number of children: 0   Years of education: Not on file   Highest education level: Bachelor's degree (e.g., BA, AB, BS)  Occupational History   Occupation: HR    Employer: [employer]  Tobacco Use   Smoking status: Never   Smokeless tobacco: Never   Tobacco comments:    N/A  Vaping Use   Vaping status: Never Used  Substance and Sexual Activity   Alcohol use: Yes    Comment: Socially   Drug use: No   Sexual activity: Yes    Partners: Male    Birth control/protection: Surgical    Comment: hysterectomy  Other Topics Concern   Not on file  Social History Narrative   Lives with husband   Caffeine - soda maybe one a day   Social Drivers of Corporate investment banker Strain: Low Risk  (03/10/2023)   Overall Financial Resource Strain (CARDIA)    Difficulty of Paying Living Expenses: Not hard at all  Food Insecurity: No Food Insecurity (03/10/2023)   Hunger Vital Sign    Worried About Running Out of Food in the Last Year: Never true    Ran Out of Food in the Last Year: Never true  Transportation Needs: No Transportation Needs (03/10/2023)   PRAPARE - Administrator, Civil Service (Medical): No    Lack of Transportation (Non-Medical): No  Physical Activity: Sufficiently Active (06/07/2023)   Exercise Vital Sign    Days of Exercise per Week: 3 days    Minutes of Exercise per Session: 50 min  Recent Concern: Physical Activity - Insufficiently Active (03/10/2023)   Exercise Vital Sign    Days of Exercise per Week: 2 days    Minutes of Exercise per Session: 30 min  Stress: Stress Concern Present (06/07/2023)   [employer] of Occupational Health - Occupational Stress Questionnaire    Feeling of Stress : To some extent  Social Connections: Socially Isolated (06/07/2023)   Social Connection and Isolation Panel [NHANES]    Frequency of Communication with Friends and Family: Twice a week    Frequency of Social Gatherings with Friends and Family: Never    Attends Religious Services: Never    Diplomatic Services operational officer: No    Attends Engineer, structural: Never    Marital Status: Married  Catering manager Violence: Not on file   Social History   Tobacco Use  Smoking Status Never  Smokeless Tobacco Never  Tobacco Comments   N/A   Social History   Substance and Sexual Activity  Alcohol Use Yes   Comment: Socially    Family History:  Family History  Problem Relation Age of Onset   Breast cancer Mother 53   Cancer Mother    Diabetes Mother    Anxiety disorder Mother    Asthma Mother    Depression Mother    Miscarriages / Stillbirths Mother    Anxiety disorder Sister    Depression Sister    Diabetes Maternal Grandmother    Stroke Maternal Grandmother    Brain cancer Maternal Grandfather    Cancer Maternal Grandfather    Cancer Paternal Grandmother        colon   Diabetes Paternal Grandmother    Stroke Maternal Uncle    Stroke Maternal Uncle    Heart disease Neg Hx    Ovarian cancer Neg Hx     Past medical history, surgical history, medications, allergies, family history and social history reviewed with patient today and changes made to appropriate areas of the chart.   Review of Systems  Constitutional:  Positive for diaphoresis. Negative for chills, fever, malaise/fatigue and weight loss.  HENT: Negative.    Eyes: Negative.   Respiratory: Negative.    Cardiovascular:  Positive for palpitations. Negative for chest pain, orthopnea, claudication, leg swelling and PND.  Gastrointestinal:  Positive for constipation and heartburn. Negative for abdominal pain,  blood in stool, diarrhea, melena, nausea and vomiting.  Genitourinary: Negative.   Musculoskeletal: Negative.   Skin: Negative.   Neurological:  Positive for dizziness and headaches. Negative for tingling, tremors, sensory change, speech change, focal weakness, seizures, loss of consciousness and weakness.  Endo/Heme/Allergies:  Negative for environmental allergies and polydipsia. Bruises/bleeds easily.  Psychiatric/Behavioral:  Positive for depression. Negative for hallucinations, memory loss, substance abuse and suicidal ideas. The patient is nervous/anxious. The patient does not have insomnia.    All other ROS negative except what is listed above and in the HPI.      Objective:     BP 109/74 (BP Location: Left Arm, Patient Position: Sitting, Cuff Size: Normal)   Pulse 90   Ht 5' 6.5" (1.689 m)   Wt 140 lb (63.5 kg)   LMP 12/15/2021 (Exact Date)   SpO2 97%   BMI 22.26 kg/m   Wt Readings from Last 3 Encounters:  06/07/23 140 lb (63.5 kg)  04/25/23 138 lb (62.6 kg)  03/14/23 139 lb 6.4 oz (63.2 kg)    Physical Exam Vitals and nursing note reviewed.  Constitutional:      General: She is not in acute distress.    Appearance: Normal appearance. She is not ill-appearing, toxic-appearing or diaphoretic.  HENT:     Head: Normocephalic and atraumatic.     Right Ear: Tympanic membrane, ear canal and external ear normal. There is no impacted cerumen.     Left Ear: Tympanic membrane, ear canal and external ear normal. There is no impacted cerumen.     Nose: Nose normal. No congestion or rhinorrhea.     Mouth/Throat:     Mouth: Mucous membranes are moist.     Pharynx: Oropharynx is clear. No oropharyngeal exudate or posterior oropharyngeal erythema.  Eyes:     General: No scleral icterus.       Right eye: No discharge.        Left eye: No discharge.     Extraocular Movements: Extraocular movements intact.     Conjunctiva/sclera: Conjunctivae normal.     Pupils: Pupils are equal,  round, and reactive to light.  Neck:     Vascular: No carotid bruit.  Cardiovascular:     Rate and Rhythm: Normal rate and regular rhythm.     Pulses: Normal pulses.     Heart sounds: No murmur heard.    No friction rub. No gallop.  Pulmonary:     Effort: Pulmonary effort is normal. No respiratory distress.     Breath sounds: Normal breath sounds. No stridor. No wheezing, rhonchi or rales.  Chest:     Chest wall: No tenderness.  Abdominal:     General: Abdomen is flat. Bowel sounds are normal. There is no distension.     Palpations: Abdomen is soft. There is no mass.     Tenderness: There is no abdominal tenderness. There is no right CVA tenderness, left CVA tenderness, guarding or rebound.     Hernia: No hernia is present.  Genitourinary:    Comments: Breast and pelvic exams deferred with shared decision making Musculoskeletal:        General: No swelling, tenderness, deformity or signs of injury.     Cervical back: Normal range of motion and neck supple. No rigidity. No muscular tenderness.     Right lower leg: No edema.     Left lower leg: No edema.  Lymphadenopathy:  Cervical: No cervical adenopathy.  Skin:    General: Skin is warm and dry.     Capillary Refill: Capillary refill takes less than 2 seconds.     Coloration: Skin is not jaundiced or pale.     Findings: No bruising, erythema, lesion or rash.  Neurological:     General: No focal deficit present.     Mental Status: She is alert and oriented to person, place, and time. Mental status is at baseline.     Cranial Nerves: No cranial nerve deficit.     Sensory: No sensory deficit.     Motor: No weakness.     Coordination: Coordination normal.     Gait: Gait normal.     Deep Tendon Reflexes: Reflexes normal.  Psychiatric:        Mood and Affect: Mood normal.        Behavior: Behavior normal.        Thought Content: Thought content normal.        Judgment: Judgment normal.     Results for orders placed or  performed in visit on 06/07/23  Lipid Panel w/o Chol/HDL Ratio   Collection Time: 06/07/23 11:02 AM  Result Value Ref Range   Cholesterol, Total 182 100 - 199 mg/dL   Triglycerides 62 0 - 149 mg/dL   HDL 60 >16 mg/dL   VLDL Cholesterol Cal 12 5 - 40 mg/dL   LDL Chol Calc (NIH) 109 (H) 0 - 99 mg/dL      Assessment & Plan:   Problem List Items Addressed This Visit       Other   Depression, recurrent (HCC)   Acting up. Will increase her wellbutrin  to 450mg  and recheck in about 4-6 weeks. Call with any concerns.       Relevant Medications   buPROPion  450 MG TB24   traZODone  (DESYREL ) 50 MG tablet   venlafaxine  XR (EFFEXOR -XR) 150 MG 24 hr capsule   Other Visit Diagnoses       Routine general medical examination at a health care facility    -  Primary   Vaccines up to date. Screening labs checked today. Pap N/A. Mammogram ordered. Continue diet and exercise. Call with any concerns.   Relevant Orders   Lipid Panel w/o Chol/HDL Ratio (Completed)     Tachycardia       Will check labs and zio monitor. Await results. Treat as needed.   Relevant Orders   LONG TERM MONITOR (3-14 DAYS)     Encounter for screening mammogram for malignant neoplasm of breast       Mammogram ordered.        Follow up plan: Return in about 6 weeks (around 07/19/2023) for virtual OK.   LABORATORY TESTING:  - Pap smear: not applicable  IMMUNIZATIONS:   - Tdap: Tetanus vaccination status reviewed: last tetanus booster within 10 years. - Influenza: Postponed to flu season - Pneumovax: Not applicable - Prevnar: Not applicable - COVID: Refused - HPV: Not applicable - Shingrix vaccine: Not applicable  SCREENING: -Mammogram: Ordered today   PATIENT COUNSELING:   Advised to take 1 mg of folate supplement per day if capable of pregnancy.   Sexuality: Discussed sexually transmitted diseases, partner selection, use of condoms, avoidance of unintended pregnancy  and contraceptive alternatives.    Advised to avoid cigarette smoking.  I discussed with the patient that most people either abstain from alcohol or drink within safe limits (<=14/week and <=4 drinks/occasion for males, <=7/weeks and <= 3  drinks/occasion for females) and that the risk for alcohol disorders and other health effects rises proportionally with the number of drinks per week and how often a drinker exceeds daily limits.  Discussed cessation/primary prevention of drug use and availability of treatment for abuse.   Diet: Encouraged to adjust caloric intake to maintain  or achieve ideal body weight, to reduce intake of dietary saturated fat and total fat, to limit sodium intake by avoiding high sodium foods and not adding table salt, and to maintain adequate dietary potassium and calcium preferably from fresh fruits, vegetables, and low-fat dairy products.    stressed the importance of regular exercise  Injury prevention: Discussed safety belts, safety helmets, smoke detector, smoking near bedding or upholstery.   Dental health: Discussed importance of regular tooth brushing, flossing, and dental visits.    NEXT PREVENTATIVE PHYSICAL DUE IN 1 YEAR. Return in about 6 weeks (around 07/19/2023) for virtual OK.

## 2023-06-08 LAB — LIPID PANEL W/O CHOL/HDL RATIO
Cholesterol, Total: 182 mg/dL (ref 100–199)
HDL: 60 mg/dL (ref >39)
LDL Chol Calc (NIH): 110 mg/dL — ABNORMAL HIGH (ref 0–99)
Triglycerides: 62 mg/dL (ref 0–149)
VLDL Cholesterol Cal: 12 mg/dL (ref 5–40)

## 2023-06-09 ENCOUNTER — Ambulatory Visit: Payer: Self-pay | Admitting: Family Medicine

## 2023-06-09 MED ORDER — VENLAFAXINE HCL ER 150 MG PO CP24: 150.0000 mg | ORAL_CAPSULE | Freq: Every day | ORAL | 1 refills | Status: DC

## 2023-06-09 MED ORDER — TRAZODONE HCL 50 MG PO TABS: ORAL_TABLET | ORAL | 1 refills | Status: DC

## 2023-06-09 NOTE — Assessment & Plan Note (Signed)
 Acting up. Will increase her wellbutrin  to 450mg  and recheck in about 4-6 weeks. Call with any concerns.

## 2023-06-30 NOTE — Telephone Encounter (Signed)
 Since the prior authorization was not submitted by our team, we are unfortunately unable to handle the appeal. Thank you.

## 2023-07-04 ENCOUNTER — Other Ambulatory Visit: Payer: Self-pay

## 2023-07-04 MED ORDER — BUPROPION HCL ER (XL) 450 MG PO TB24: 450.0000 mg | ORAL_TABLET | Freq: Every day | ORAL | 1 refills | Status: DC

## 2023-07-10 DIAGNOSIS — R000 Tachycardia, unspecified: Secondary | ICD-10-CM

## 2023-07-11 ENCOUNTER — Ambulatory Visit

## 2023-07-19 ENCOUNTER — Telehealth: Admitting: Family Medicine

## 2023-07-19 ENCOUNTER — Encounter: Payer: Self-pay | Admitting: Family Medicine

## 2023-07-19 VITALS — Ht 66.5 in | Wt 140.0 lb

## 2023-07-19 DIAGNOSIS — F339 Major depressive disorder, recurrent, unspecified: Secondary | ICD-10-CM

## 2023-07-19 DIAGNOSIS — I471 Supraventricular tachycardia, unspecified: Secondary | ICD-10-CM | POA: Diagnosis not present

## 2023-07-19 NOTE — Assessment & Plan Note (Signed)
 Has been on medicine for about 2 weeks. No side effects, but not noticing much of a difference yet. Continue current regimen. Will check in in about

## 2023-07-19 NOTE — Progress Notes (Signed)
 Ht 5' 6.5 (1.689 m)   Wt 140 lb (63.5 kg)   LMP 12/15/2021 (Exact Date)   BMI 22.26 kg/m    Subjective:    Patient ID: Bethany Harrison, female    DOB: [birth year], 38 y.o.   MRN: [id number]  HPI: Bethany Harrison is a 37 y.o. female  Chief Complaint  Patient presents with   Depression   DEPRESSION Mood status: stable Satisfied with current treatment?: unsure Symptom severity: moderate  Duration of current treatment : couple of weeks Side effects: no Medication compliance: excellent compliance Psychotherapy/counseling: yes  Previous psychiatric medications: wellbutrin , effexor  Depressed mood: yes Anxious mood: yes Anhedonia: no Significant weight loss or gain: no Insomnia: no  Fatigue: yes Feelings of worthlessness or guilt: no Impaired concentration/indecisiveness: no Suicidal ideations: no Hopelessness: no Crying spells: no    07/19/2023    3:11 PM 06/07/2023   10:36 AM 03/14/2023   11:14 AM 02/07/2023   10:35 AM 05/31/2022   10:22 AM  Depression screen PHQ 2/9  Decreased Interest 1 3 1 3 1   Down, Depressed, Hopeless 3 2 1 3 2   PHQ - 2 Score 4 5 2 6 3   Altered sleeping 3 1 0 3 2  Tired, decreased energy 1 1 1 2 2   Change in appetite 0 1 1 2  0  Feeling bad or failure about yourself  0 1 0 0 1  Trouble concentrating 2 1 0 1 1  Moving slowly or fidgety/restless 2 2 1 3 2   Suicidal thoughts 0 0 0 0 1  PHQ-9 Score 12 12 5 17 12   Difficult doing work/chores Somewhat difficult Very difficult Somewhat difficult Extremely dIfficult Somewhat difficult    Relevant past medical, surgical, family and social history reviewed and updated as indicated. Interim medical history since our last visit reviewed. Allergies and medications reviewed and updated.  Review of Systems  Constitutional: Negative.   Respiratory: Negative.    Cardiovascular: Negative.   Musculoskeletal: Negative.   Neurological: Negative.   Psychiatric/Behavioral: Negative.      Per HPI unless specifically  indicated above     Objective:    Ht 5' 6.5 (1.689 m)   Wt 140 lb (63.5 kg)   LMP 12/15/2021 (Exact Date)   BMI 22.26 kg/m   Wt Readings from Last 3 Encounters:  07/19/23 140 lb (63.5 kg)  06/07/23 140 lb (63.5 kg)  04/25/23 138 lb (62.6 kg)    Physical Exam Vitals and nursing note reviewed.  Constitutional:      General: She is not in acute distress.    Appearance: Normal appearance. She is not ill-appearing, toxic-appearing or diaphoretic.  HENT:     Head: Normocephalic and atraumatic.     Right Ear: External ear normal.     Left Ear: External ear normal.     Nose: Nose normal.     Mouth/Throat:     Mouth: Mucous membranes are moist.     Pharynx: Oropharynx is clear.  Eyes:     General: No scleral icterus.       Right eye: No discharge.        Left eye: No discharge.     Conjunctiva/sclera: Conjunctivae normal.     Pupils: Pupils are equal, round, and reactive to light.  Pulmonary:     Effort: Pulmonary effort is normal. No respiratory distress.     Comments: Speaking in full sentences Musculoskeletal:        General: Normal range of motion.  Cervical back: Normal range of motion.  Skin:    Coloration: Skin is not jaundiced or pale.     Findings: No bruising, erythema, lesion or rash.  Neurological:     Mental Status: She is alert and oriented to person, place, and time. Mental status is at baseline.  Psychiatric:        Mood and Affect: Mood normal.        Behavior: Behavior normal.        Thought Content: Thought content normal.        Judgment: Judgment normal.     Results for orders placed or performed in visit on 06/07/23  Lipid Panel w/o Chol/HDL Ratio   Collection Time: 06/07/23 11:02 AM  Result Value Ref Range   Cholesterol, Total 182 100 - 199 mg/dL   Triglycerides 62 0 - 149 mg/dL   HDL 60 >60 mg/dL   VLDL Cholesterol Cal 12 5 - 40 mg/dL   LDL Chol Calc (NIH) 889 (H) 0 - 99 mg/dL      Assessment & Plan:   Problem List Items Addressed  This Visit       Other   Depression, recurrent (HCC) - Primary   Has been on medicine for about 2 weeks. No side effects, but not noticing much of a difference yet. Continue current regimen. Will check in in about       Other Visit Diagnoses       SVT (supraventricular tachycardia) (HCC)       Rarely on holter. Would like to avoid more mediation at this time. Avoid caffiene. Call with any concerns.        Follow up plan: Return in about 5 months (around 12/11/2023) for 6 month follow up.    This visit was completed via video visit through MyChart due to the restrictions of the COVID-19 pandemic. All issues as above were discussed and addressed. Physical exam was done as above through visual confirmation on video through MyChart. If it was felt that the patient should be evaluated in the office, they were directed there. The patient verbally consented to this visit. Location of the patient: home Location of the provider: work Those involved with this call:  Provider: Duwaine Louder, DO CMA: York Fogo, CMA, Front Desk/Registration: Wonda Sauers  Time spent on call: 15 minutes with patient face to face via video conference. More than 50% of this time was spent in counseling and coordination of care. 23 minutes total spent in review of patient's record and preparation of their chart.

## 2023-07-26 ENCOUNTER — Ambulatory Visit

## 2023-07-27 ENCOUNTER — Encounter: Payer: Self-pay | Admitting: Neurology

## 2023-07-28 ENCOUNTER — Other Ambulatory Visit: Payer: Self-pay | Admitting: Family Medicine

## 2023-07-28 DIAGNOSIS — Z1231 Encounter for screening mammogram for malignant neoplasm of breast: Secondary | ICD-10-CM

## 2023-08-02 ENCOUNTER — Encounter: Payer: Self-pay | Admitting: Family Medicine

## 2023-08-02 ENCOUNTER — Ambulatory Visit: Admitting: Family Medicine

## 2023-08-02 VITALS — BP 101/71 | HR 105 | Temp 98.5°F | Resp 16 | Ht 66.5 in | Wt 138.0 lb

## 2023-08-02 DIAGNOSIS — R251 Tremor, unspecified: Secondary | ICD-10-CM | POA: Diagnosis not present

## 2023-08-02 DIAGNOSIS — R2689 Other abnormalities of gait and mobility: Secondary | ICD-10-CM

## 2023-08-02 LAB — BAYER DCA HB A1C WAIVED: HB A1C (BAYER DCA - WAIVED): 5.2 % (ref 4.8–5.6)

## 2023-08-02 NOTE — Progress Notes (Signed)
 BP 101/71 (BP Location: Right Arm, Patient Position: Sitting, Cuff Size: Normal)   Pulse (!) 105   Temp 98.5 F (36.9 C) (Oral)   Resp 16   Ht 5' 6.5 (1.689 m)   Wt 138 lb (62.6 kg)   LMP 12/15/2021 (Exact Date)   SpO2 99%   BMI 21.94 kg/m    Subjective:    Patient ID: Bethany Harrison, female    DOB: 1985-07-05, 38 y.o.   MRN: [id number]  HPI: Bethany Harrison is a 38 y.o. female  Chief Complaint  Patient presents with   Migraine    New symptoms started last week. Dizziness, off balance, no depth perception, shaky hands. Also has flutters in her eyes that can last a few minutes at a time.    Bethany Harrison presents today for an acute visit for dizziness and not feeling like herself. She notes that she started having symptoms about a week ago on Thursday. The symptoms came on suddenly. She notes that she was driving to the beach and started having a tremor in her hands. She was having trouble focusing her eyes on a screen, was having trouble focusing her thoughts. She notes that she was getting dizzy when changing position and it was getting worse. Off balance and having trouble standing appropriately. She has been having more issues with her memory. She called neurology and they advised her to be seen here first. She notes that she smoked some weed in the winter and was blacking out and had her hands were cramping up, but hasn't had an issue with this since. She notes that she has been still dizzy since the weekend, but is feeling a little better.   Relevant past medical, surgical, family and social history reviewed and updated as indicated. Interim medical history since our last visit reviewed. Allergies and medications reviewed and updated.  Review of Systems  Constitutional: Negative.   Respiratory: Negative.    Cardiovascular: Negative.   Musculoskeletal: Negative.   Neurological:  Positive for dizziness, tremors, syncope, light-headedness and numbness. Negative for seizures, facial  asymmetry, speech difficulty, weakness and headaches.  Hematological: Negative.   Psychiatric/Behavioral: Negative.      Per HPI unless specifically indicated above     Objective:    BP 101/71 (BP Location: Right Arm, Patient Position: Sitting, Cuff Size: Normal)   Pulse (!) 105   Temp 98.5 F (36.9 C) (Oral)   Resp 16   Ht 5' 6.5 (1.689 m)   Wt 138 lb (62.6 kg)   LMP 12/15/2021 (Exact Date)   SpO2 99%   BMI 21.94 kg/m   Wt Readings from Last 3 Encounters:  08/02/23 138 lb (62.6 kg)  07/19/23 140 lb (63.5 kg)  06/07/23 140 lb (63.5 kg)    Physical Exam Vitals and nursing note reviewed.  Constitutional:      General: She is not in acute distress.    Appearance: Normal appearance. She is not ill-appearing, toxic-appearing or diaphoretic.  HENT:     Head: Normocephalic and atraumatic.     Right Ear: External ear normal.     Left Ear: External ear normal.     Nose: Nose normal.     Mouth/Throat:     Mouth: Mucous membranes are moist.     Pharynx: Oropharynx is clear.  Eyes:     General: No scleral icterus.       Right eye: No discharge.        Left eye: No discharge.  Extraocular Movements: Extraocular movements intact.     Conjunctiva/sclera: Conjunctivae normal.     Pupils: Pupils are equal, round, and reactive to light.  Cardiovascular:     Rate and Rhythm: Normal rate and regular rhythm.     Pulses: Normal pulses.     Heart sounds: Normal heart sounds. No murmur heard.    No friction rub. No gallop.  Pulmonary:     Effort: Pulmonary effort is normal. No respiratory distress.     Breath sounds: Normal breath sounds. No stridor. No wheezing, rhonchi or rales.  Chest:     Chest wall: No tenderness.  Musculoskeletal:        General: Normal range of motion.     Cervical back: Normal range of motion and neck supple.  Skin:    General: Skin is warm and dry.     Capillary Refill: Capillary refill takes less than 2 seconds.     Coloration: Skin is not  jaundiced or pale.     Findings: No bruising, erythema, lesion or rash.  Neurological:     General: No focal deficit present.     Mental Status: She is alert and oriented to person, place, and time. Mental status is at baseline.  Psychiatric:        Mood and Affect: Mood normal.        Behavior: Behavior normal.        Thought Content: Thought content normal.        Judgment: Judgment normal.     Results for orders placed or performed in visit on 06/07/23  Lipid Panel w/o Chol/HDL Ratio   Collection Time: 06/07/23 11:02 AM  Result Value Ref Range   Cholesterol, Total 182 100 - 199 mg/dL   Triglycerides 62 0 - 149 mg/dL   HDL 60 >60 mg/dL   VLDL Cholesterol Cal 12 5 - 40 mg/dL   LDL Chol Calc (NIH) 889 (H) 0 - 99 mg/dL      Assessment & Plan:   Problem List Items Addressed This Visit   None Visit Diagnoses       Balance problems    -  Primary   Will check CT head to R/O stroke. If normal- will consider MRI w/o given history of vestibular migraines. Continue   Relevant Orders   CT HEAD WO CONTRAST ( )   Bayer DCA Hb A1c Waived   CBC with Differential/Platelet   Comprehensive metabolic panel with GFR   TSH   VITAMIN D  25 Hydroxy (Vit-D Deficiency, Fractures)   Magnesium   Phosphorus     Tremor       Will check labs. Await results. May need to get her back into neurology.   Relevant Orders   Bayer DCA Hb A1c Waived   CBC with Differential/Platelet   Comprehensive metabolic panel with GFR   TSH   VITAMIN D  25 Hydroxy (Vit-D Deficiency, Fractures)   Magnesium   Phosphorus        Follow up plan: Return for Pending results. SABRA

## 2023-08-03 ENCOUNTER — Ambulatory Visit
Admission: RE | Admit: 2023-08-03 | Discharge: 2023-08-03 | Disposition: A | Discharge status: Home / Self Care | Source: Ambulatory Visit | Attending: Family Medicine | Admitting: Family Medicine

## 2023-08-03 ENCOUNTER — Ambulatory Visit: Payer: Self-pay | Admitting: Family Medicine

## 2023-08-03 DIAGNOSIS — R2689 Other abnormalities of gait and mobility: Secondary | ICD-10-CM | POA: Insufficient documentation

## 2023-08-03 DIAGNOSIS — G43009 Migraine without aura, not intractable, without status migrainosus: Secondary | ICD-10-CM

## 2023-08-03 DIAGNOSIS — R251 Tremor, unspecified: Secondary | ICD-10-CM

## 2023-08-03 LAB — COMPREHENSIVE METABOLIC PANEL WITH GFR
ALT: 18 IU/L (ref 0–32)
AST: 19 IU/L (ref 0–40)
Albumin: 4.5 g/dL (ref 3.9–4.9)
Alkaline Phosphatase: 54 IU/L (ref 44–121)
BUN/Creatinine Ratio: 20 (ref 9–23)
BUN: 16 mg/dL (ref 6–24)
Bilirubin Total: 0.5 mg/dL (ref 0.0–1.2)
CO2: 22 mmol/L (ref 20–29)
Calcium: 9.8 mg/dL (ref 8.7–10.2)
Chloride: 101 mmol/L (ref 96–106)
Creatinine, Ser: 0.81 mg/dL (ref 0.57–1.00)
Globulin, Total: 2.3 g/dL (ref 1.5–4.5)
Glucose: 77 mg/dL (ref 70–99)
Potassium: 4.4 mmol/L (ref 3.5–5.2)
Sodium: 141 mmol/L (ref 134–144)
Total Protein: 6.8 g/dL (ref 6.0–8.5)
eGFR: 94 mL/min/1.73 (ref >59)

## 2023-08-03 LAB — CBC WITH DIFFERENTIAL/PLATELET
Basophils Absolute: 0.1 x10E3/uL (ref 0.0–0.2)
Basos: 1 %
EOS (ABSOLUTE): 0.3 x10E3/uL (ref 0.0–0.4)
Eos: 3 %
Hematocrit: 43.8 % (ref 34.0–46.6)
Hemoglobin: 14.2 g/dL (ref 11.1–15.9)
Immature Grans (Abs): 0 x10E3/uL (ref 0.0–0.1)
Immature Granulocytes: 0 %
Lymphocytes Absolute: 2.7 x10E3/uL (ref 0.7–3.1)
Lymphs: 33 %
MCH: 33.6 pg — ABNORMAL HIGH (ref 26.6–33.0)
MCHC: 32.4 g/dL (ref 31.5–35.7)
MCV: 104 fL — ABNORMAL HIGH (ref 79–97)
Monocytes Absolute: 0.6 x10E3/uL (ref 0.1–0.9)
Monocytes: 7 %
Neutrophils Absolute: 4.4 x10E3/uL (ref 1.4–7.0)
Neutrophils: 56 %
Platelets: 326 x10E3/uL (ref 150–450)
RBC: 4.23 x10E6/uL (ref 3.77–5.28)
RDW: 11.7 % (ref 11.7–15.4)
WBC: 8 x10E3/uL (ref 3.4–10.8)

## 2023-08-03 LAB — MAGNESIUM: Magnesium: 2.1 mg/dL (ref 1.6–2.3)

## 2023-08-03 LAB — VITAMIN D 25 HYDROXY (VIT D DEFICIENCY, FRACTURES): Vit D, 25-Hydroxy: 42.1 ng/mL (ref 30.0–100.0)

## 2023-08-03 LAB — TSH: TSH: 1.72 u[IU]/mL (ref 0.450–4.500)

## 2023-08-03 LAB — PHOSPHORUS: Phosphorus: 3.8 mg/dL (ref 3.0–4.3)

## 2023-08-04 ENCOUNTER — Telehealth: Payer: Self-pay | Admitting: Family Medicine

## 2023-08-04 ENCOUNTER — Ambulatory Visit: Payer: Self-pay

## 2023-08-04 NOTE — Telephone Encounter (Signed)
 FYI Only or Action Required?: Action required by provider: medication refill request.  Patient was last seen in primary care on 08/02/2023 by Vicci Bouchard P, DO.  Called Nurse Triage reporting advice only.  Triage Disposition: Call PCP When Office is Open  Patient/caregiver understands and will follow disposition?: Yes    Copied from CRM [id number]. Topic: Clinical - Medical Advice >> Aug 04, 2023  5:09 PM Santiya F wrote: Reason for CRM: Patient is calling in because she left her medication at home and she is out of town and patient says she can't go without taking the medication. I checked the cart and saw the CRM regarding it, but there hasn't been a resolution. Patient is requesting 4 pills to last until she gets home Monday. Reason for Disposition  [1] Caller requesting NON-URGENT health information AND [2] PCP's office is the best resource  Answer Assessment - Initial Assessment Questions 1. REASON FOR CALL: What is the main reason for your call? or How can I best help you?    Pt calling to follow up on medications she requested earlier today.  Nurse informed pt that it could take up to 3 days for refills.  Pt stated she was not aware of that. Informed pt I would send a follow up message.  Protocols used: Information Only Call - No Triage-A-AH

## 2023-08-04 NOTE — Telephone Encounter (Signed)
 Copied from CRM [id number]. Topic: Clinical - Medication Refill >> Aug 04, 2023  9:32 AM Myrick T wrote: Medication: buPROPion  HCl ER, XL, 450 MG TB24 and venlafaxine  XR (EFFEXOR -XR) 150 MG 24 hr capsule  Has the patient contacted their pharmacy? No (Agent: If no, request that the patient contact the pharmacy for the refill. If patient does not wish to contact the pharmacy document the reason why and proceed with request.) (Agent: If yes, when and what did the pharmacy advise?)  This is the patient's preferred pharmacy:  [id number] [address], [place] - [address] AT [facility] [address]  Phone: [phone] Fax: [phone]  Is this the correct pharmacy for this prescription? Yes  Has the prescription been filled recently? Yes  Is the patient out of the medication? No  Has the patient been seen for an appointment in the last year OR does the patient have an upcoming appointment? Yes  Can we respond through MyChart? Yes  Agent: Please be advised that Rx refills may take up to 3 business days. We ask that you follow-up with your pharmacy.  Patient left for the beach and forgot her meds. She needs a 4 day short supply as she says she can not go without her medication.

## 2023-08-07 ENCOUNTER — Encounter: Payer: Self-pay | Admitting: Family Medicine

## 2023-08-07 NOTE — Telephone Encounter (Signed)
 Will call patient later this morning to see how she is doing.

## 2023-08-07 NOTE — Telephone Encounter (Signed)
 Per patient, no further need.

## 2023-08-07 NOTE — Telephone Encounter (Signed)
 Too soon for refill.  Requested Prescriptions  Pending Prescriptions Disp Refills   buPROPion  HCl ER, XL, 450 MG TB24 90 tablet 1    Sig: Take 1 tablet (450 mg total) by mouth daily.     Psychiatry: Antidepressants - bupropion  Passed - 08/07/2023  8:53 AM      Passed - Cr in normal range and within 360 days    Creatinine, Ser  Date Value Ref Range Status  08/02/2023 0.81 0.57 - 1.00 mg/dL Final         Passed - AST in normal range and within 360 days    AST  Date Value Ref Range Status  08/02/2023 19 0 - 40 IU/L Final         Passed - ALT in normal range and within 360 days    ALT  Date Value Ref Range Status  08/02/2023 18 0 - 32 IU/L Final         Passed - Completed PHQ-2 or PHQ-9 in the last 360 days      Passed - Last BP in normal range    BP Readings from Last 1 Encounters:  08/02/23 101/71         Passed - Valid encounter within last 6 months    Recent Outpatient Visits           5 days ago Balance problems   [place] [facility] [place], Megan P, DO   2 weeks ago Depression, recurrent (HCC)   [place] [facility] [place], Megan P, DO   2 months ago Routine general medical examination at a health care facility   [facility] [place], [place] P, DO   4 months ago Depression, recurrent [facility])   [place] [facility] [place], Duwaine SQUIBB, DO       Future Appointments             In 3 months [place] Lauraine PARAS, NP [place] [facility]             venlafaxine  XR (EFFEXOR -XR) 150 MG 24 hr capsule 90 capsule 1    Sig: Take 1 capsule (150 mg total) by mouth daily with breakfast.     Psychiatry: Antidepressants - SNRI - desvenlafaxine & venlafaxine  Failed - 08/07/2023  8:53 AM      Failed - Lipid Panel in normal range within the last 12 months    Cholesterol, Total  Date Value Ref Range Status  06/07/2023 182 100 - 199 mg/dL Final   LDL Chol Calc (NIH)  Date Value Ref  Range Status  06/07/2023 110 (H) 0 - 99 mg/dL Final   HDL  Date Value Ref Range Status  06/07/2023 60 >39 mg/dL Final   Triglycerides  Date Value Ref Range Status  06/07/2023 62 0 - 149 mg/dL Final         Passed - Cr in normal range and within 360 days    Creatinine, Ser  Date Value Ref Range Status  08/02/2023 0.81 0.57 - 1.00 mg/dL Final         Passed - Completed PHQ-2 or PHQ-9 in the last 360 days      Passed - Last BP in normal range    BP Readings from Last 1 Encounters:  08/02/23 101/71         Passed - Valid encounter within last 6 months    Recent Outpatient Visits  5 days ago Balance problems   [place] [facility] [place], [place] P, DO   2 weeks ago Depression, recurrent [facility])   [place] [facility] [place], Megan P, DO   2 months ago Routine general medical examination at a health care facility   [facility] [place], [place] P, DO   4 months ago Depression, recurrent [facility])   [place] [facility] Vicci Duwaine SQUIBB, DO       Future Appointments             In 3 months [place], Lauraine PARAS, NP [facility]

## 2023-08-08 ENCOUNTER — Inpatient Hospital Stay
Admission: RE | Admit: 2023-08-08 | Discharge: 2023-08-08 | Discharge status: Home / Self Care | Source: Ambulatory Visit | Attending: Family Medicine

## 2023-08-08 DIAGNOSIS — Z1231 Encounter for screening mammogram for malignant neoplasm of breast: Secondary | ICD-10-CM

## 2023-08-10 ENCOUNTER — Ambulatory Visit
Admission: RE | Admit: 2023-08-10 | Discharge: 2023-08-10 | Disposition: A | Discharge status: Home / Self Care | Source: Ambulatory Visit | Attending: Family Medicine | Admitting: Family Medicine

## 2023-08-10 DIAGNOSIS — R251 Tremor, unspecified: Secondary | ICD-10-CM | POA: Insufficient documentation

## 2023-08-10 DIAGNOSIS — G43009 Migraine without aura, not intractable, without status migrainosus: Secondary | ICD-10-CM | POA: Diagnosis present

## 2023-08-10 DIAGNOSIS — R2689 Other abnormalities of gait and mobility: Secondary | ICD-10-CM | POA: Diagnosis present

## 2023-08-10 MED ORDER — GADOBUTROL 1 MMOL/ML IV SOLN
6.0000 mL | Freq: Once | INTRAVENOUS | Status: AC | PRN
  Administered 2023-08-10: 6 mL via INTRAVENOUS

## 2023-08-11 ENCOUNTER — Ambulatory Visit: Payer: Self-pay | Admitting: Family Medicine

## 2023-08-14 ENCOUNTER — Ambulatory Visit

## 2023-08-14 ENCOUNTER — Encounter: Payer: Self-pay | Admitting: Obstetrics and Gynecology

## 2023-08-15 ENCOUNTER — Ambulatory Visit: Payer: Self-pay | Admitting: Family Medicine

## 2023-08-15 ENCOUNTER — Encounter: Payer: Self-pay | Admitting: Family Medicine

## 2023-08-15 DIAGNOSIS — R42 Dizziness and giddiness: Secondary | ICD-10-CM

## 2023-08-15 DIAGNOSIS — H9193 Unspecified hearing loss, bilateral: Secondary | ICD-10-CM

## 2023-08-23 ENCOUNTER — Other Ambulatory Visit: Payer: Self-pay | Admitting: Family Medicine

## 2023-08-23 DIAGNOSIS — G43009 Migraine without aura, not intractable, without status migrainosus: Secondary | ICD-10-CM

## 2023-10-19 ENCOUNTER — Ambulatory Visit
Admission: RE | Admit: 2023-10-19 | Discharge: 2023-10-19 | Disposition: A | Discharge status: Home / Self Care | Source: Ambulatory Visit | Attending: Family Medicine | Admitting: Family Medicine

## 2023-10-19 ENCOUNTER — Encounter: Payer: Self-pay | Admitting: Family Medicine

## 2023-10-19 ENCOUNTER — Ambulatory Visit: Admitting: Family Medicine

## 2023-10-19 VITALS — BP 109/76 | HR 108 | Temp 98.0°F | Ht 66.5 in | Wt 143.8 lb

## 2023-10-19 DIAGNOSIS — M545 Low back pain, unspecified: Secondary | ICD-10-CM | POA: Diagnosis not present

## 2023-10-19 MED ORDER — PREDNISONE 10 MG PO TABS: ORAL_TABLET | ORAL | 0 refills | Status: DC

## 2023-10-19 MED ORDER — TIZANIDINE HCL 4 MG PO TABS: 4.0000 mg | ORAL_TABLET | Freq: Every day | ORAL | 1 refills | Status: DC

## 2023-10-19 NOTE — Progress Notes (Signed)
 BP 109/76   Pulse (!) 108   Temp 98 F (36.7 C) (Oral)   Ht 5' 6.5 (1.689 m)   Wt 143 lb 12.8 oz (65.2 kg)   LMP 12/15/2021 (Exact Date)   SpO2 98%   BMI 22.86 kg/m    Subjective:    Patient ID: Bethany Harrison, female    DOB: 15-Apr-1985, 38 y.o.   MRN: [id number]  HPI: Bethany Harrison is a 38 y.o. female  Chief Complaint  Patient presents with   Tailbone Pain    Onset about 2 months.    BACK PAIN Duration: 2 months Mechanism of injury: unknown Location: R>L and low back Onset: sudden Severity: moderate to severe Quality: throbbing, sharp Frequency: intermittent Radiation: none Aggravating factors: sitting on it, going from sitting to standing Alleviating factors: standing Status: stable Treatments attempted: rest, ice, heat, APAP, ibuprofen , aleve, and HEP  Relief with NSAIDs?: no Nighttime pain:  no Paresthesias / decreased sensation:  no Bowel / bladder incontinence:  no Fevers:  no Dysuria / urinary frequency:  no  Relevant past medical, surgical, family and social history reviewed and updated as indicated. Interim medical history since our last visit reviewed. Allergies and medications reviewed and updated.  Review of Systems  Constitutional: Negative.   Respiratory: Negative.    Cardiovascular: Negative.   Musculoskeletal:  Positive for back pain and myalgias. Negative for arthralgias, gait problem, joint swelling, neck pain and neck stiffness.  Skin: Negative.   Neurological: Negative.   Psychiatric/Behavioral: Negative.      Per HPI unless specifically indicated above     Objective:    BP 109/76   Pulse (!) 108   Temp 98 F (36.7 C) (Oral)   Ht 5' 6.5 (1.689 m)   Wt 143 lb 12.8 oz (65.2 kg)   LMP 12/15/2021 (Exact Date)   SpO2 98%   BMI 22.86 kg/m   Wt Readings from Last 3 Encounters:  10/19/23 143 lb 12.8 oz (65.2 kg)  08/02/23 138 lb (62.6 kg)  07/19/23 140 lb (63.5 kg)    Physical Exam Vitals and nursing note reviewed.   Constitutional:      General: She is not in acute distress.    Appearance: Normal appearance. She is normal weight. She is not ill-appearing, toxic-appearing or diaphoretic.  HENT:     Head: Normocephalic and atraumatic.     Right Ear: External ear normal.     Left Ear: External ear normal.     Nose: Nose normal.     Mouth/Throat:     Mouth: Mucous membranes are moist.     Pharynx: Oropharynx is clear.  Eyes:     General: No scleral icterus.       Right eye: No discharge.        Left eye: No discharge.     Extraocular Movements: Extraocular movements intact.     Conjunctiva/sclera: Conjunctivae normal.     Pupils: Pupils are equal, round, and reactive to light.  Cardiovascular:     Rate and Rhythm: Normal rate and regular rhythm.     Pulses: Normal pulses.     Heart sounds: Normal heart sounds. No murmur heard.    No friction rub. No gallop.  Pulmonary:     Effort: Pulmonary effort is normal. No respiratory distress.     Breath sounds: Normal breath sounds. No stridor. No wheezing, rhonchi or rales.  Chest:     Chest wall: No tenderness.  Musculoskeletal:  General: Normal range of motion.     Cervical back: Normal range of motion and neck supple.  Skin:    General: Skin is warm and dry.     Capillary Refill: Capillary refill takes less than 2 seconds.     Coloration: Skin is not jaundiced or pale.     Findings: No bruising, erythema, lesion or rash.  Neurological:     General: No focal deficit present.     Mental Status: She is alert and oriented to person, place, and time. Mental status is at baseline.  Psychiatric:        Mood and Affect: Mood normal.        Behavior: Behavior normal.        Thought Content: Thought content normal.        Judgment: Judgment normal.     Results for orders placed or performed in visit on 08/02/23  Bayer DCA Hb A1c Waived   Collection Time: 08/02/23  2:28 PM  Result Value Ref Range   HB A1C (BAYER DCA - WAIVED) 5.2 4.8 - 5.6  %  CBC with Differential/Platelet   Collection Time: 08/02/23  2:29 PM  Result Value Ref Range   WBC 8.0 3.4 - 10.8 x10E3/uL   RBC 4.23 3.77 - 5.28 x10E6/uL   Hemoglobin 14.2 11.1 - 15.9 g/dL   Hematocrit 56.1 65.9 - 46.6 %   MCV 104 (H) 79 - 97 fL   MCH 33.6 (H) 26.6 - 33.0 pg   MCHC 32.4 31.5 - 35.7 g/dL   RDW 88.2 88.2 - 84.5 %   Platelets 326 150 - 450 x10E3/uL   Neutrophils 56 Not Estab. %   Lymphs 33 Not Estab. %   Monocytes 7 Not Estab. %   Eos 3 Not Estab. %   Basos 1 Not Estab. %   Neutrophils Absolute 4.4 1.4 - 7.0 x10E3/uL   Lymphocytes Absolute 2.7 0.7 - 3.1 x10E3/uL   Monocytes Absolute 0.6 0.1 - 0.9 x10E3/uL   EOS (ABSOLUTE) 0.3 0.0 - 0.4 x10E3/uL   Basophils Absolute 0.1 0.0 - 0.2 x10E3/uL   Immature Granulocytes 0 Not Estab. %   Immature Grans (Abs) 0.0 0.0 - 0.1 x10E3/uL  Comprehensive metabolic panel with GFR   Collection Time: 08/02/23  2:29 PM  Result Value Ref Range   Glucose 77 70 - 99 mg/dL   BUN 16 6 - 24 mg/dL   Creatinine, Ser 9.18 0.57 - 1.00 mg/dL   eGFR 94 >40 fO/fpw/8.26   BUN/Creatinine Ratio 20 9 - 23   Sodium 141 134 - 144 mmol/L   Potassium 4.4 3.5 - 5.2 mmol/L   Chloride 101 96 - 106 mmol/L   CO2 22 20 - 29 mmol/L   Calcium 9.8 8.7 - 10.2 mg/dL   Total Protein 6.8 6.0 - 8.5 g/dL   Albumin 4.5 3.9 - 4.9 g/dL   Globulin, Total 2.3 1.5 - 4.5 g/dL   Bilirubin Total 0.5 0.0 - 1.2 mg/dL   Alkaline Phosphatase 54 44 - 121 IU/L   AST 19 0 - 40 IU/L   ALT 18 0 - 32 IU/L  TSH   Collection Time: 08/02/23  2:29 PM  Result Value Ref Range   TSH 1.720 0.450 - 4.500 uIU/mL  VITAMIN D  25 Hydroxy (Vit-D Deficiency, Fractures)   Collection Time: 08/02/23  2:29 PM  Result Value Ref Range   Vit D, 25-Hydroxy 42.1 30.0 - 100.0 ng/mL  Magnesium   Collection Time: 08/02/23  2:29 PM  Result Value Ref Range   Magnesium 2.1 1.6 - 2.3 mg/dL  Phosphorus   Collection Time: 08/02/23  2:29 PM  Result Value Ref Range   Phosphorus 3.8 3.0 - 4.3 mg/dL       Assessment & Plan:   Problem List Items Addressed This Visit   None Visit Diagnoses       Acute midline low back pain without sciatica    -  Primary   Concern for glut tendonitis- will treat with prednisone  and tizanidine and obtain x-ray. Call with any concerns. Continue to monitor.   Relevant Medications   predniSONE  (DELTASONE ) 10 MG tablet   tiZANidine (ZANAFLEX) 4 MG tablet   Other Relevant Orders   DG Lumbar Spine Complete        Follow up plan: Return for As scheduled.

## 2023-10-27 ENCOUNTER — Ambulatory Visit: Payer: Self-pay | Admitting: Family Medicine

## 2023-11-02 ENCOUNTER — Other Ambulatory Visit: Payer: Self-pay | Admitting: Family Medicine

## 2023-11-03 NOTE — Telephone Encounter (Signed)
 Requested Prescriptions  Pending Prescriptions Disp Refills   traZODone  (DESYREL ) 50 MG tablet [Pharmacy Med Name: traZODone  HCl 50 MG Oral Tablet] 90 tablet 0    Sig: TAKE 1/2 TO 1 TABLET BY MOUTH AT BEDTIME AS NEEDED FOR SLEEP     Psychiatry: Antidepressants - Serotonin Modulator Passed - 11/03/2023  2:16 PM      Passed - Completed PHQ-2 or PHQ-9 in the last 360 days      Passed - Valid encounter within last 6 months    Recent Outpatient Visits           2 weeks ago Acute midline low back pain without sciatica   [place] [facility] [place], Megan P, DO   3 months ago Balance problems   [place] [facility] [place], [place], DO   3 months ago Depression, recurrent   [place] [facility] [place], Megan P, DO   4 months ago Routine general medical examination at a health care facility   [facility] [place], Megan P, DO   7 months ago Depression, recurrent    [facility] [place], Duwaine SQUIBB, DO       Future Appointments             In 1 week [place] Lauraine PARAS, NP [facility]

## 2023-11-14 NOTE — Progress Notes (Unsigned)
 Patient: Bethany Harrison Date of Birth: 1985-02-17  Reason for Visit: Follow up History from: Patient Primary Neurologist: Dr. Chima/Dr. Onita   Virtual Visit via Video Note  I connected with Bethany Harrison Bring on 11/15/23 at  3:15 PM EDT by a video enabled telemedicine application and verified that I am speaking with the correct person using two identifiers.  Location: Patient: at her home Provider: in the office    I discussed the limitations of evaluation and management by telemedicine and the availability of in person appointments. The patient expressed understanding and agreed to proceed.  ASSESSMENT AND PLAN 38 y.o. year old female   1.  Chronic migraine headaches with vertigo symptoms  -Continue Emgality  for migraine prevention -Continue Topamax  50 mg at bedtime for migraine prevention  -Also on Effexor  that could benefit migraine -Continue Nurtec 75 mg at onset of migraine -Dizziness is more with turning/moving head, ENT did not feel vestibular rehab would be beneficial.   - MRI of the brain with and without contrast in August 2025 was unremarkable  - ENT evaluation for dizziness was unremarkable  - Referred to specialized ophthalmologist for evaluation of left eye abnormality, that appointment is 11/27/23, reach out to me after with findings  -Previously tried and failed: Relpax , Zomig : (insurance would not cover), Topamax , Effexor , amitriptyline, Ajovy,  Imitrex, Maxalt, Fioricet, Zofran   Follow up in 1 year   HISTORY Copied 08/25/21 Dr. Rush: The patient presents for evaluation of headaches and dizziness. She has had migraines since her early 34s. She has had dizziness for the past 6-12 months, but it has worsened in the past 3 months. Currently she has episodes of dizziness twice per week. Describes this as both a sensation of lightheadedness and like her body is moving. She will feel uncoordinated and trip over her feet. She will have associated fullness and ringing in her  ears. Typically she will get a dizzy spell, then develop a migraine. She will have some form of a headache >50% of the time she has dizziness. Currently she has 1-2 headaches per week.   She was started on meclizine  which helps but takes several hours to start working. Has tried multiple migraine medications previously. She is not sure if they were helpful but notes she did not have vertigo at the time she was using them.   Headache History: Onset: early 77s Triggers: menstrual cycle Aura: none Location: unilateral Quality/Description: throbbing Associated Symptoms:             Photophobia: yes             Phonophobia: yes             Nausea: yes Vomiting: no Other symptoms: vertigo Worse with activity?: yes Duration of headaches: several hours   Headache days per month: 8 Headache free days per month: 22   Current Treatment: Abortive Dramamine meclizine    Preventative Topamax  100 mg QHS Effexor  150 mg daily   Prior Therapies                                 Topamax  100 mg QHS Effexor  amitriptyline Emgality  Ajovy Nurtec 75 mg - lack of efficacy Imitrex - lack of efficacy Maxalt Fioricet Zofran    Update December 14, 2021 SS: This month is her 3rd Emgality , it is helping. Is due next week for injection. Yesterday dizziness, took meclizine  with good benefit. Zomig  didn't work but nasal spray  was back ordered so tried tablets, eletriptan  was expensive for 2 pills. Had Fioricet she took today, it went away. Over the last 3 weeks, only 1 spell needed rescue. Wants to hold off on MRI right now.   Update Jun 02, 2022 SS: remains on Emgality , had hysterectomy in December. Went to hormone doctor, her testosterone was low, this 1st month felt amazing, had no dizziness! The dizziness has come back somewhat, with mild headache. Has not had any severe headaches. Dizziness is 4-5 days a month, used to be 3 weeks a month. On Topamax  100 mg.  Overall doing a lot better.  Update April 25, 2023 SS: having about 2 migraines a month, on Emgality , had to go to ER in Feb for dizziness with migraine, Toradol  steroid, reglan  IM injection. Insurance wouldn't cover Zomig . Has been given prescription for Fioricet, rarely takes. Mentions brain fog, going on for a long time. Sometimes feels like fine tremor in hands. Just started Wellbutrin  for depression, is getting better. PCP did labs in Oct 2024, TSH 1.910.  Update 11/15/23 SS: MRI of the brain with and without contrast July 2025 was unremarkable. At that time, had balance issues. Saw ENT, good report. Saw eye doctor, found something behind left eye, sending to specialist. Stopped Topamax  until July, went back to 50 mg. Intermittent dizziness. Typical migraine, eyes start feeling funny, sharp pain left sided, sometimes nausea. Takes Nurtec and Advil  at 1st onset, rapid acting. Dizziness when turning head, looking up and down, ENT found no etiology. Remains on Emgality , has about 1-2 migraines a month. Going to [facility] 11/10  REVIEW OF SYSTEMS: Out of a complete 14 system review of symptoms, the patient complains only of the following symptoms, and all other reviewed systems are negative.  See HPI  ALLERGIES: Allergies  Allergen Reactions   Sulfa Antibiotics Nausea Only    HOME MEDICATIONS: Outpatient Medications Prior to Visit  Medication Sig Dispense Refill   buPROPion  HCl ER, XL, 450 MG TB24 Take 1 tablet (450 mg total) by mouth daily. 90 tablet 1   butalbital -acetaminophen -caffeine  (FIORICET) 50-325-40 MG tablet Take 1 tablet by mouth every 6 (six) hours as needed for headache or migraine. 12 tablet 0   Galcanezumab -gnlm (EMGALITY ) 120 MG/ML SOAJ Inject 120 mg into the skin every 30 (thirty) days. 1.12 mL 11   ondansetron  (ZOFRAN -ODT) 4 MG disintegrating tablet Take 1 tablet (4 mg total) by mouth every 8 (eight) hours as needed. 20 tablet 3   predniSONE  (DELTASONE ) 10 MG tablet 6 tabs in the AM with food day 1, 5 tabs day 2,  decrease by 1 every day until gone 21 tablet 0   Rimegepant Sulfate (NURTEC) 75 MG TBDP Take 1 tablet (75 mg total) by mouth as needed. 8 tablet 11   tiZANidine (ZANAFLEX) 4 MG tablet Take 1 tablet (4 mg total) by mouth at bedtime. 30 tablet 1   traZODone  (DESYREL ) 50 MG tablet TAKE 1/2 TO 1 TABLET BY MOUTH AT BEDTIME AS NEEDED FOR SLEEP 90 tablet 0   Triamcinolone  Acetonide (NASACORT  ALLERGY 24HR NA) Place into the nose.     venlafaxine  XR (EFFEXOR -XR) 150 MG 24 hr capsule Take 1 capsule (150 mg total) by mouth daily with breakfast. 90 capsule 1   eletriptan  (RELPAX ) 20 MG tablet Take 40 mg by mouth.     No facility-administered medications prior to visit.    PAST MEDICAL HISTORY: Past Medical History:  Diagnosis Date   Abdominal pain    Allergy  2011   Anxiety    Depression    Endometriosis    GERD (gastroesophageal reflux disease)    Headache    Hemorrhoid    History of kidney stones    Migraine    PONV (postoperative nausea and vomiting)    Ulcerative colitis (HCC)     PAST SURGICAL HISTORY: Past Surgical History:  Procedure Laterality Date   ABDOMINAL HYSTERECTOMY  2023   APPENDECTOMY  2013   with laparoscopy @ [facility]   AUGMENTATION MAMMAPLASTY Bilateral 11/2022   BIOPSY  09/28/2022   Procedure: BIOPSY;  Surgeon: Onita Elspeth Sharper, DO;  Location: [facility] ENDOSCOPY;  Service: Gastroenterology;;   BREAST CYST EXCISION Right 2017   sebaceous cyst removed   BREAST SURGERY  11/2022   BUNIONECTOMY  08/2008   CHROMOPERTUBATION N/A 06/30/2014   Procedure: CHROMOPERTUBATION;  Surgeon: Gladis DELENA Dollar, MD;  Location: [facility] ORS;  Service: Gynecology;  Laterality: N/A;   COLONOSCOPY  2014   COLONOSCOPY N/A 09/28/2022   Procedure: COLONOSCOPY;  Surgeon: Onita Elspeth Sharper, DO;  Location: [facility] ENDOSCOPY;  Service: Gastroenterology;  Laterality: N/A;   COSMETIC SURGERY  2024   Breast   DIAGNOSTIC LAPAROSCOPY     DILATION AND CURETTAGE OF UTERUS  2013   polyp    HERNIA REPAIR  1989   umbilical   INTRAUTERINE DEVICE INSERTION  06/24/2011   Mirena  IUD   IUD REMOVAL N/A 06/30/2014   Procedure: INTRAUTERINE DEVICE (IUD) REMOVAL;  Surgeon: Gladis DELENA Dollar, MD;  Location: [facility] ORS;  Service: Gynecology;  Laterality: N/A;   IUD REMOVAL  12/27/2021   Procedure: INTRAUTERINE DEVICE (IUD) REMOVAL;  Surgeon: Janit Alm Agent, MD;  Location: [facility] ORS;  Service: Gynecology;;   labiaplasty  11/25/2022   [place]   LAPAROSCOPIC VAGINAL HYSTERECTOMY WITH SALPINGECTOMY Right 12/27/2021   Procedure: LAPAROSCOPIC ASSISTED VAGINAL HYSTERECTOMY WITH SALPINGECTOMY;  Surgeon: Janit Alm Agent, MD;  Location: [facility] ORS;  Service: Gynecology;  Laterality: Right;   LAPAROSCOPY N/A 06/30/2014   Procedure:  with excision of endoemtriosis and biopsy;  Surgeon: Gladis DELENA Dollar, MD;  Location: [facility] ORS;  Service: Gynecology;  Laterality: N/A;   PELVIC LAPAROSCOPY  2009   times 2   TONSILLECTOMY      FAMILY HISTORY: Family History  Problem Relation Age of Onset   Breast cancer Mother 4   Cancer Mother    Diabetes Mother    Anxiety disorder Mother    Asthma Mother    Depression Mother    Miscarriages / Stillbirths Mother    Anxiety disorder Sister    Depression Sister    Stroke Maternal Uncle    Stroke Maternal Uncle    Diabetes Maternal Grandmother    Stroke Maternal Grandmother    Brain cancer Maternal Grandfather    Cancer Maternal Grandfather    Diabetes Paternal Grandmother    Colon cancer Paternal Grandmother    Heart disease Neg Hx    Ovarian cancer Neg Hx     SOCIAL HISTORY: Social History   Socioeconomic History   Marital status: Married    Spouse name: Not on file   Number of children: 0   Years of education: Not on file   Highest education level: Bachelor's degree (e.g., BA, AB, BS)  Occupational History   Occupation: HR    Employer: [employer]  Tobacco Use   Smoking status: Never   Smokeless tobacco: Never   Tobacco  comments:    N/A  Vaping Use   Vaping status:  Never Used  Substance and Sexual Activity   Alcohol use: Yes    Comment: Socially   Drug use: No   Sexual activity: Yes    Partners: Male    Birth control/protection: Surgical    Comment: hysterectomy  Other Topics Concern   Not on file  Social History Narrative   Lives with husband   Caffeine - soda maybe one a day   Social Drivers of Corporate Investment Banker Strain: Low Risk  (07/18/2023)   Overall Financial Resource Strain (CARDIA)    Difficulty of Paying Living Expenses: Not hard at all  Food Insecurity: No Food Insecurity (07/18/2023)   Hunger Vital Sign    Worried About Running Out of Food in the Last Year: Never true    Ran Out of Food in the Last Year: Never true  Transportation Needs: No Transportation Needs (07/18/2023)   PRAPARE - Administrator, Civil Service (Medical): No    Lack of Transportation (Non-Medical): No  Physical Activity: Sufficiently Active (07/18/2023)   Exercise Vital Sign    Days of Exercise per Week: 3 days    Minutes of Exercise per Session: 50 min  Stress: Stress Concern Present (07/18/2023)   [employer] of Occupational Health - Occupational Stress Questionnaire    Feeling of Stress: To some extent  Social Connections: Socially Isolated (07/18/2023)   Social Connection and Isolation Panel    Frequency of Communication with Friends and Family: Once a week    Frequency of Social Gatherings with Friends and Family: Once a week    Attends Religious Services: Never    Database Administrator or Organizations: No    Attends Engineer, Structural: Not on file    Marital Status: Married  Catering Manager Violence: Not on file    PHYSICAL EXAM  There were no vitals filed for this visit.  There is no height or weight on file to calculate BMI.  Virtual Visit   DIAGNOSTIC DATA (LABS, IMAGING, TESTING) - I reviewed patient records, labs, notes, testing and imaging myself where  available.  Lab Results  Component Value Date   WBC 8.0 08/02/2023   HGB 14.2 08/02/2023   HCT 43.8 08/02/2023   MCV 104 (H) 08/02/2023   PLT 326 08/02/2023      Component Value Date/Time   NA 141 08/02/2023 1429   K 4.4 08/02/2023 1429   CL 101 08/02/2023 1429   CO2 22 08/02/2023 1429   GLUCOSE 77 08/02/2023 1429   BUN 16 08/02/2023 1429   CREATININE 0.81 08/02/2023 1429   CALCIUM 9.8 08/02/2023 1429   PROT 6.8 08/02/2023 1429   ALBUMIN 4.5 08/02/2023 1429   AST 19 08/02/2023 1429   ALT 18 08/02/2023 1429   ALKPHOS 54 08/02/2023 1429   BILITOT 0.5 08/02/2023 1429   Lab Results  Component Value Date   CHOL 182 06/07/2023   HDL 60 06/07/2023   LDLCALC 110 (H) 06/07/2023   TRIG 62 06/07/2023   Lab Results  Component Value Date   HGBA1C 5.2 08/02/2023   Lab Results  Component Value Date   VITAMINB12 779 11/14/2022   Lab Results  Component Value Date   TSH 1.720 08/02/2023   Lauraine Born, AGNP-C, DNP 11/15/2023, 3:46 PM [facility] [address] [place], [place] [address] [phone]

## 2023-11-15 ENCOUNTER — Telehealth: Payer: Self-pay | Admitting: Neurology

## 2023-11-15 ENCOUNTER — Telehealth (INDEPENDENT_AMBULATORY_CARE_PROVIDER_SITE_OTHER): Admitting: Neurology

## 2023-11-15 DIAGNOSIS — G43709 Chronic migraine without aura, not intractable, without status migrainosus: Secondary | ICD-10-CM | POA: Diagnosis not present

## 2023-11-15 DIAGNOSIS — G43009 Migraine without aura, not intractable, without status migrainosus: Secondary | ICD-10-CM

## 2023-11-15 MED ORDER — TOPIRAMATE 50 MG PO TABS: 50.0000 mg | ORAL_TABLET | Freq: Every day | ORAL | 3 refills | Status: AC

## 2023-11-15 NOTE — Telephone Encounter (Signed)
 Error

## 2023-11-15 NOTE — Patient Instructions (Signed)
 Great to see you today! Continue current medications Reach out to me about eye doctor visit  Follow up in 1 year. Thanks!

## 2023-11-17 ENCOUNTER — Encounter: Payer: Self-pay | Admitting: Family Medicine

## 2023-11-21 ENCOUNTER — Other Ambulatory Visit: Payer: Self-pay | Admitting: Family Medicine

## 2023-11-21 MED ORDER — VENLAFAXINE HCL ER 150 MG PO CP24: 150.0000 mg | ORAL_CAPSULE | Freq: Every day | ORAL | 1 refills | Status: AC

## 2023-11-23 NOTE — Telephone Encounter (Signed)
 Too soon for refill, LRF 07/04/23 FOR 90 AND 1 RF.  Requested Prescriptions  Pending Prescriptions Disp Refills   buPROPion  HCl ER, XL, 450 MG TB24 [Pharmacy Med Name: BUPROPION   450MG   TAB  XL 24 HR] 90 tablet 3    Sig: TAKE 1 TABLET BY MOUTH DAILY     Psychiatry: Antidepressants - bupropion  Passed - 11/23/2023  1:56 PM      Passed - Cr in normal range and within 360 days    Creatinine, Ser  Date Value Ref Range Status  08/02/2023 0.81 0.57 - 1.00 mg/dL Final         Passed - AST in normal range and within 360 days    AST  Date Value Ref Range Status  08/02/2023 19 0 - 40 IU/L Final         Passed - ALT in normal range and within 360 days    ALT  Date Value Ref Range Status  08/02/2023 18 0 - 32 IU/L Final         Passed - Completed PHQ-2 or PHQ-9 in the last 360 days      Passed - Last BP in normal range    BP Readings from Last 1 Encounters:  10/19/23 109/76         Passed - Valid encounter within last 6 months    Recent Outpatient Visits           1 month ago Acute midline low back pain without sciatica   [place] [facility] [place], Megan P, DO   3 months ago Balance problems   [place] [facility] [place], [place], DO   4 months ago Depression, recurrent   [place] [facility] [place], Megan P, DO   5 months ago Routine general medical examination at a health care facility   [facility] [place], Megan P, DO   8 months ago Depression, recurrent   [place] [facility] [place] Duwaine SQUIBB, DO       Future Appointments             In 12 months [place], Lauraine PARAS, NP [facility]

## 2023-11-27 ENCOUNTER — Ambulatory Visit: Admitting: Family Medicine

## 2023-12-04 ENCOUNTER — Ambulatory Visit: Admitting: Family Medicine

## 2023-12-05 ENCOUNTER — Ambulatory Visit: Admitting: Family Medicine

## 2023-12-05 ENCOUNTER — Encounter: Payer: Self-pay | Admitting: Family Medicine

## 2023-12-05 VITALS — BP 104/74 | HR 91 | Temp 98.0°F | Ht 66.5 in | Wt 138.6 lb

## 2023-12-05 DIAGNOSIS — R42 Dizziness and giddiness: Secondary | ICD-10-CM

## 2023-12-05 DIAGNOSIS — Z23 Encounter for immunization: Secondary | ICD-10-CM

## 2023-12-05 DIAGNOSIS — G43009 Migraine without aura, not intractable, without status migrainosus: Secondary | ICD-10-CM

## 2023-12-05 DIAGNOSIS — M545 Low back pain, unspecified: Secondary | ICD-10-CM

## 2023-12-05 DIAGNOSIS — Z789 Other specified health status: Secondary | ICD-10-CM

## 2023-12-05 DIAGNOSIS — K5909 Other constipation: Secondary | ICD-10-CM | POA: Insufficient documentation

## 2023-12-05 DIAGNOSIS — F339 Major depressive disorder, recurrent, unspecified: Secondary | ICD-10-CM

## 2023-12-05 MED ORDER — TIZANIDINE HCL 4 MG PO TABS: 4.0000 mg | ORAL_TABLET | Freq: Every day | ORAL | 1 refills | Status: AC

## 2023-12-05 MED ORDER — LUBIPROSTONE 24 MCG PO CAPS: 24.0000 ug | ORAL_CAPSULE | Freq: Every day | ORAL | 2 refills | Status: AC

## 2023-12-05 MED ORDER — TRAZODONE HCL 50 MG PO TABS: 25.0000 mg | ORAL_TABLET | Freq: Every evening | ORAL | 0 refills | Status: AC | PRN

## 2023-12-05 MED ORDER — BUPROPION HCL ER (XL) 450 MG PO TB24: 450.0000 mg | ORAL_TABLET | Freq: Every day | ORAL | 1 refills | Status: AC

## 2023-12-05 NOTE — Assessment & Plan Note (Signed)
 Will start amitiza and recheck in about a month. Call with any concerns.

## 2023-12-05 NOTE — Progress Notes (Signed)
 BP 104/74   Pulse 91   Temp 98 F (36.7 C) (Oral)   Ht 5' 6.5 (1.689 m)   Wt 138 lb 9.6 oz (62.9 kg)   LMP 12/15/2021 (Exact Date)   PF 98 L/min   BMI 22.04 kg/m    Subjective:    Patient ID: Bethany Harrison, female    DOB: October 03, 1985, 38 y.o.   MRN: [id number]  HPI: Bethany Harrison is a 38 y.o. female  Chief Complaint  Patient presents with   Dizziness   Back Pain   Depression   DEPRESSION Mood status: stable Satisfied with current treatment?: yes Symptom severity: mild  Duration of current treatment : chronic Side effects: no Medication compliance: excellent compliance Psychotherapy/counseling: yes  Previous psychiatric medications: effexor , wellbutrin  Depressed mood: yes Anxious mood: yes Anhedonia: no Significant weight loss or gain: no Insomnia: no  Fatigue: no Feelings of worthlessness or guilt: no Impaired concentration/indecisiveness: no Suicidal ideations: no Hopelessness: no Crying spells: no    12/05/2023    1:24 PM 08/02/2023    2:09 PM 07/19/2023    3:11 PM 06/07/2023   10:36 AM 03/14/2023   11:14 AM  Depression screen PHQ 2/9  Decreased Interest 1 1 1 3 1   Down, Depressed, Hopeless 1 1 3 2 1   PHQ - 2 Score 2 2 4 5 2   Altered sleeping 1 2 3 1  0  Tired, decreased energy 1 0 1 1 1   Change in appetite 1 0 0 1 1  Feeling bad or failure about yourself  0 0 0 1 0  Trouble concentrating 0 1 2 1  0  Moving slowly or fidgety/restless 1 1 2 2 1   Suicidal thoughts 0 0 0 0 0  PHQ-9 Score 6 6  12  12  5    Difficult doing work/chores Not difficult at all Somewhat difficult Somewhat difficult Very difficult Somewhat difficult     Data saved with a previous flowsheet row definition   Dizziness is not any better. She notes that she has seen several specialists for it. Worse with moving her eyes. Neurologist was unsure if it was vestibular migraine. Very frustrated. Has been significantly impacting her quality of life.   ABDOMINAL ISSUES Duration:  chronic Nature: bloating, constipation Location: diffuse  Severity: moderate  Radiation: no Frequency: most of the time Alleviating factors: laxatives Aggravating factors: unknown Treatments attempted: laxatives Constipation: yes Diarrhea: no Mucous in the stool: no Heartburn: no Bloating:yes Flatulence: yes Nausea: no Vomiting: no Melena or hematochezia: no Rash: no Jaundice: no Fever: no Weight loss: no    Relevant past medical, surgical, family and social history reviewed and updated as indicated. Interim medical history since our last visit reviewed. Allergies and medications reviewed and updated.  Review of Systems  Constitutional:  Positive for fatigue. Negative for activity change, appetite change, chills, diaphoresis, fever and unexpected weight change.  Respiratory: Negative.    Cardiovascular: Negative.   Gastrointestinal:  Positive for abdominal distention and constipation. Negative for abdominal pain, anal bleeding, blood in stool, diarrhea, nausea, rectal pain and vomiting.  Skin: Negative.   Neurological:  Positive for dizziness. Negative for tremors, seizures, syncope, facial asymmetry, speech difficulty, weakness, light-headedness, numbness and headaches.  Psychiatric/Behavioral: Negative.      Per HPI unless specifically indicated above     Objective:    BP 104/74   Pulse 91   Temp 98 F (36.7 C) (Oral)   Ht 5' 6.5 (1.689 m)   Wt 138 lb 9.6  oz (62.9 kg)   LMP 12/15/2021 (Exact Date)   PF 98 L/min   BMI 22.04 kg/m   Wt Readings from Last 3 Encounters:  12/05/23 138 lb 9.6 oz (62.9 kg)  10/19/23 143 lb 12.8 oz (65.2 kg)  08/02/23 138 lb (62.6 kg)    Physical Exam Vitals and nursing note reviewed.  Constitutional:      General: She is not in acute distress.    Appearance: Normal appearance. She is not ill-appearing, toxic-appearing or diaphoretic.  HENT:     Head: Normocephalic and atraumatic.     Right Ear: External ear normal.      Left Ear: External ear normal.     Nose: Nose normal.     Mouth/Throat:     Mouth: Mucous membranes are moist.     Pharynx: Oropharynx is clear.  Eyes:     General: No scleral icterus.       Right eye: No discharge.        Left eye: No discharge.     Extraocular Movements: Extraocular movements intact.     Conjunctiva/sclera: Conjunctivae normal.     Pupils: Pupils are equal, round, and reactive to light.  Cardiovascular:     Rate and Rhythm: Normal rate and regular rhythm.     Pulses: Normal pulses.     Heart sounds: Normal heart sounds. No murmur heard.    No friction rub. No gallop.  Pulmonary:     Effort: Pulmonary effort is normal. No respiratory distress.     Breath sounds: Normal breath sounds. No stridor. No wheezing, rhonchi or rales.  Chest:     Chest wall: No tenderness.  Musculoskeletal:        General: Normal range of motion.     Cervical back: Normal range of motion and neck supple.  Skin:    General: Skin is warm and dry.     Capillary Refill: Capillary refill takes less than 2 seconds.     Coloration: Skin is not jaundiced or pale.     Findings: No bruising, erythema, lesion or rash.  Neurological:     General: No focal deficit present.     Mental Status: She is alert and oriented to person, place, and time. Mental status is at baseline.  Psychiatric:        Mood and Affect: Mood normal.        Behavior: Behavior normal.        Thought Content: Thought content normal.        Judgment: Judgment normal.     Results for orders placed or performed in visit on 08/02/23  Bayer DCA Hb A1c Waived   Collection Time: 08/02/23  2:28 PM  Result Value Ref Range   HB A1C (BAYER DCA - WAIVED) 5.2 4.8 - 5.6 %  CBC with Differential/Platelet   Collection Time: 08/02/23  2:29 PM  Result Value Ref Range   WBC 8.0 3.4 - 10.8 x10E3/uL   RBC 4.23 3.77 - 5.28 x10E6/uL   Hemoglobin 14.2 11.1 - 15.9 g/dL   Hematocrit 56.1 65.9 - 46.6 %   MCV 104 (H) 79 - 97 fL   MCH 33.6  (H) 26.6 - 33.0 pg   MCHC 32.4 31.5 - 35.7 g/dL   RDW 88.2 88.2 - 84.5 %   Platelets 326 150 - 450 x10E3/uL   Neutrophils 56 Not Estab. %   Lymphs 33 Not Estab. %   Monocytes 7 Not Estab. %   Eos 3 Not  Estab. %   Basos 1 Not Estab. %   Neutrophils Absolute 4.4 1.4 - 7.0 x10E3/uL   Lymphocytes Absolute 2.7 0.7 - 3.1 x10E3/uL   Monocytes Absolute 0.6 0.1 - 0.9 x10E3/uL   EOS (ABSOLUTE) 0.3 0.0 - 0.4 x10E3/uL   Basophils Absolute 0.1 0.0 - 0.2 x10E3/uL   Immature Granulocytes 0 Not Estab. %   Immature Grans (Abs) 0.0 0.0 - 0.1 x10E3/uL  Comprehensive metabolic panel with GFR   Collection Time: 08/02/23  2:29 PM  Result Value Ref Range   Glucose 77 70 - 99 mg/dL   BUN 16 6 - 24 mg/dL   Creatinine, Ser 9.18 0.57 - 1.00 mg/dL   eGFR 94 >40 fO/fpw/8.26   BUN/Creatinine Ratio 20 9 - 23   Sodium 141 134 - 144 mmol/L   Potassium 4.4 3.5 - 5.2 mmol/L   Chloride 101 96 - 106 mmol/L   CO2 22 20 - 29 mmol/L   Calcium 9.8 8.7 - 10.2 mg/dL   Total Protein 6.8 6.0 - 8.5 g/dL   Albumin 4.5 3.9 - 4.9 g/dL   Globulin, Total 2.3 1.5 - 4.5 g/dL   Bilirubin Total 0.5 0.0 - 1.2 mg/dL   Alkaline Phosphatase 54 44 - 121 IU/L   AST 19 0 - 40 IU/L   ALT 18 0 - 32 IU/L  TSH   Collection Time: 08/02/23  2:29 PM  Result Value Ref Range   TSH 1.720 0.450 - 4.500 uIU/mL  VITAMIN D  25 Hydroxy (Vit-D Deficiency, Fractures)   Collection Time: 08/02/23  2:29 PM  Result Value Ref Range   Vit D, 25-Hydroxy 42.1 30.0 - 100.0 ng/mL  Magnesium   Collection Time: 08/02/23  2:29 PM  Result Value Ref Range   Magnesium 2.1 1.6 - 2.3 mg/dL  Phosphorus   Collection Time: 08/02/23  2:29 PM  Result Value Ref Range   Phosphorus 3.8 3.0 - 4.3 mg/dL      Assessment & Plan:   Problem List Items Addressed This Visit       Cardiovascular and Mediastinum   Migraine without aura and without status migrainosus, not intractable   Chronic and worsening with dizziness worse when moving her eyes. Would like to get a  2nd opinion with neuro-ophthalmology. Referral generated today.       Relevant Medications   buPROPion  HCl ER, XL, 450 MG TB24   tiZANidine (ZANAFLEX) 4 MG tablet   traZODone  (DESYREL ) 50 MG tablet   Other Relevant Orders   Ambulatory referral to Neurology     Digestive   Chronic constipation   Will start amitiza and recheck in about a month. Call with any concerns.       Relevant Orders   Ambulatory referral to Physical Therapy     Other   Depression, recurrent   Under good control on current regimen. Continue current regimen. Continue to monitor. Call with any concerns. Refills given.        Relevant Medications   buPROPion  HCl ER, XL, 450 MG TB24   traZODone  (DESYREL ) 50 MG tablet   Other Visit Diagnoses       Dizziness    -  Primary   Chronic and worsening with dizziness worse when moving her eyes. Would like to get a 2nd opinion with neuro-ophthalmology. Referral generated today.   Relevant Orders   Ambulatory referral to Neurology     Acute midline low back pain without sciatica       Not significantly better. Will treat  constipation and get her in for pelvic floor PT. Call with any concerns.   Relevant Medications   tiZANidine (ZANAFLEX) 4 MG tablet     Hepatitis B vaccination status unknown       Labs drawn today. Await results. Treat as needed.   Relevant Orders   Hepatitis B surface antibody,quantitative     Needs flu shot       Flu shot given today.   Relevant Orders   Flu vaccine trivalent PF, 6mos and older(Flulaval,Afluria,Fluarix,Fluzone) (Completed)        Follow up plan: Return in about 6 weeks (around 01/16/2024).

## 2023-12-05 NOTE — Assessment & Plan Note (Signed)
 Chronic and worsening with dizziness worse when moving her eyes. Would like to get a 2nd opinion with neuro-ophthalmology. Referral generated today.

## 2023-12-05 NOTE — Assessment & Plan Note (Signed)
 Under good control on current regimen. Continue current regimen. Continue to monitor. Call with any concerns. Refills given.

## 2023-12-06 LAB — HEPATITIS B SURFACE ANTIBODY, QUANTITATIVE: Hepatitis B Surf Ab Quant: <3.5 m[IU]/mL — ABNORMAL LOW

## 2023-12-07 ENCOUNTER — Other Ambulatory Visit (HOSPITAL_COMMUNITY): Payer: Self-pay

## 2023-12-07 ENCOUNTER — Telehealth: Payer: Self-pay

## 2023-12-07 NOTE — Telephone Encounter (Signed)
 Pharmacy Patient Advocate Encounter   Received notification from Onbase that prior authorization for lubiprostone (AMITIZA) 24 MCG capsule  is required/requested.   Insurance verification completed.   The patient is insured through [facility]   Per test claim: PA required; PA submitted to above mentioned insurance via Latent Key/confirmation #/EOC [id number] Status is pending

## 2023-12-08 ENCOUNTER — Other Ambulatory Visit (HOSPITAL_COMMUNITY): Payer: Self-pay

## 2023-12-08 NOTE — Telephone Encounter (Signed)
 Pharmacy Patient Advocate Encounter  Received notification from [facility] that Prior Authorization for Lubiprostone  capsules has been APPROVED  to 11.20.26. Ran test claim, Copay is $0.00. This test claim was processed through [facility]- copay amounts may vary at other pharmacies due to pharmacy/plan contracts, or as the patient moves through the different stages of their insurance plan.   PA #/Case ID/Reference #: [id number]

## 2023-12-11 ENCOUNTER — Ambulatory Visit: Payer: Self-pay | Admitting: Family Medicine

## 2023-12-29 ENCOUNTER — Encounter: Payer: Self-pay | Admitting: Family Medicine

## 2024-01-01 ENCOUNTER — Ambulatory Visit: Admitting: Podiatry

## 2024-01-01 ENCOUNTER — Encounter: Payer: Self-pay | Admitting: Podiatry

## 2024-01-01 ENCOUNTER — Ambulatory Visit

## 2024-01-01 DIAGNOSIS — M7751 Other enthesopathy of right foot: Secondary | ICD-10-CM

## 2024-01-01 DIAGNOSIS — M778 Other enthesopathies, not elsewhere classified: Secondary | ICD-10-CM

## 2024-01-01 MED ORDER — TRIAMCINOLONE ACETONIDE 10 MG/ML IJ SUSP
10.0000 mg | Freq: Once | INTRAMUSCULAR | Status: AC
  Administered 2024-01-01: 17:00:00 10 mg via INTRA_ARTICULAR

## 2024-01-03 NOTE — Progress Notes (Signed)
 Subjective:   Patient ID: Bethany Harrison, female   DOB: 38 y.o.   MRN: [id number]   HPI Patient presents stating she has been having a lot of discomfort at times in her right big toe joint and states that she is very active now and doing a lot of different activities which put strain on it..  Patient has been in good health patient does not smoke and likes to be active   Review of Systems  All other systems reviewed and are negative.       Objective:  Physical Exam Vitals and nursing note reviewed.  Constitutional:      Appearance: She is well-developed.  Pulmonary:     Effort: Pulmonary effort is normal.  Musculoskeletal:        General: Normal range of motion.  Skin:    General: Skin is warm.  Neurological:     Mental Status: She is alert.     Neurovascular status was found to be intact muscle strength was found to be adequate range of motion within normal limits.  Patient does have elevation of the first metatarsal segment right which is just part of her normal anatomy with a relative flexible flatfoot deformity which I think is creating functional compression on the joint and discomfort.  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  I went ahead today I did sterile prep I injected around the joint surface 3 mg dexamethasone  Kenalog  5 mg Xylocaine  and advised on more rigid bottom shoes and will be seen back by pedorthist for casting  X-rays indicate that there is elevation of the first metatarsal segment joint is clean fixation in place good correction from previous deformity

## 2024-01-25 ENCOUNTER — Ambulatory Visit: Admitting: Family Medicine

## 2024-02-02 ENCOUNTER — Encounter: Payer: Self-pay | Admitting: Family Medicine

## 2024-02-02 ENCOUNTER — Ambulatory Visit: Admitting: Family Medicine

## 2024-02-02 VITALS — BP 97/66 | HR 102 | Temp 97.9°F | Ht 66.5 in | Wt 129.0 lb

## 2024-02-02 DIAGNOSIS — Z23 Encounter for immunization: Secondary | ICD-10-CM | POA: Diagnosis not present

## 2024-02-02 DIAGNOSIS — R0602 Shortness of breath: Secondary | ICD-10-CM

## 2024-02-02 DIAGNOSIS — R000 Tachycardia, unspecified: Secondary | ICD-10-CM

## 2024-02-02 MED ORDER — ALBUTEROL SULFATE HFA 108 (90 BASE) MCG/ACT IN AERS: 2.0000 | INHALATION_SPRAY | Freq: Four times a day (QID) | RESPIRATORY_TRACT | 3 refills | Status: AC | PRN

## 2024-02-02 NOTE — Progress Notes (Signed)
 "  BP 97/66   Pulse (!) 102   Temp 97.9 F (36.6 C) (Oral)   Ht 5' 6.5 (1.689 m)   Wt 129 lb (58.5 kg)   LMP 12/15/2021   SpO2 98%   BMI 20.51 kg/m    Subjective:    Patient ID: Bethany Harrison, female    DOB: [birth year], 39 y.o.   MRN: [id number]  HPI: Bethany Harrison is a 39 y.o. female  Chief Complaint  Patient presents with   Tachycardia   PALPITATIONS- notes that she has been SOB and feels like her heart is racing with activity. She notes that she has been feeling this way since she had COVID. She exercises, but feels like she is really out of shape Duration: a few years Symptom description: SOB and palpitations Duration of episode: minutes Frequency: recurrentl Activity when event occurred: walking up stairs Related to exertion: yes Dyspnea: yes Chest pain: no Syncope: no Anxiety/stress: no Nausea/vomiting: no Diaphoresis: no Coronary artery disease: no Congestive heart failure: no Arrhythmia:no Thyroid disease: no Caffeine  intake: 1 cafienated beverage Status:  stable Treatments attempted:none  Relevant past medical, surgical, family and social history reviewed and updated as indicated. Interim medical history since our last visit reviewed. Allergies and medications reviewed and updated.  Review of Systems  Constitutional: Negative.   HENT: Negative.    Respiratory:  Positive for shortness of breath. Negative for apnea, cough, choking, chest tightness, wheezing and stridor.   Cardiovascular:  Positive for palpitations. Negative for chest pain and leg swelling.  Neurological: Negative.   Psychiatric/Behavioral: Negative.      Per HPI unless specifically indicated above     Objective:    BP 97/66   Pulse (!) 102   Temp 97.9 F (36.6 C) (Oral)   Ht 5' 6.5 (1.689 m)   Wt 129 lb (58.5 kg)   LMP 12/15/2021   SpO2 98%   BMI 20.51 kg/m   Wt Readings from Last 3 Encounters:  02/02/24 129 lb (58.5 kg)  12/05/23 138 lb 9.6 oz (62.9 kg)  10/19/23 143 lb  12.8 oz (65.2 kg)    Physical Exam Vitals and nursing note reviewed.  Constitutional:      General: She is not in acute distress.    Appearance: Normal appearance. She is not ill-appearing, toxic-appearing or diaphoretic.  HENT:     Head: Normocephalic and atraumatic.     Right Ear: External ear normal.     Left Ear: External ear normal.     Nose: Nose normal.     Mouth/Throat:     Mouth: Mucous membranes are moist.     Pharynx: Oropharynx is clear.  Eyes:     General: No scleral icterus.       Right eye: No discharge.        Left eye: No discharge.     Extraocular Movements: Extraocular movements intact.     Conjunctiva/sclera: Conjunctivae normal.     Pupils: Pupils are equal, round, and reactive to light.  Cardiovascular:     Rate and Rhythm: Normal rate and regular rhythm.     Pulses: Normal pulses.     Heart sounds: Normal heart sounds. No murmur heard.    No friction rub. No gallop.  Pulmonary:     Effort: Pulmonary effort is normal. No respiratory distress.     Breath sounds: Normal breath sounds. No stridor. No wheezing, rhonchi or rales.  Chest:     Chest wall: No tenderness.  Musculoskeletal:  General: Normal range of motion.     Cervical back: Normal range of motion and neck supple.  Skin:    General: Skin is warm and dry.     Capillary Refill: Capillary refill takes less than 2 seconds.     Coloration: Skin is not jaundiced or pale.     Findings: No bruising, erythema, lesion or rash.  Neurological:     General: No focal deficit present.     Mental Status: She is alert and oriented to person, place, and time. Mental status is at baseline.  Psychiatric:        Mood and Affect: Mood normal.        Behavior: Behavior normal.        Thought Content: Thought content normal.        Judgment: Judgment normal.     Results for orders placed or performed in visit on 12/05/23  Hepatitis B surface antibody,quantitative   Collection Time: 12/05/23  2:12 PM   Result Value Ref Range   Hepatitis B Surf Ab Quant <3.5 (L) Immunity>10 mIU/mL      Assessment & Plan:   Problem List Items Addressed This Visit   None Visit Diagnoses       SOB (shortness of breath)    -  Primary   ? Post-covid issue. Will get her into pulmonology for ?PFTs and recheck in 6 weeks. Call wiht any concerns.   Relevant Orders   Ambulatory referral to Pulmonology     Tachycardia       EKG normal. Zio monitor normal. Discussed beta blocker but BP already low and SOB a bigger issue- will get PFTs and recheck in about 6 weeks.   Relevant Orders   EKG 12-Lead   CBC with Differential/Platelet   Comprehensive metabolic panel with GFR   TSH   VITAMIN D  25 Hydroxy (Vit-D Deficiency, Fractures)   Magnesium   Phosphorus        Follow up plan: Return in about 6 weeks (around 03/15/2024).      "

## 2024-02-03 LAB — COMPREHENSIVE METABOLIC PANEL WITH GFR
ALT: 19 IU/L (ref 0–32)
AST: 15 IU/L (ref 0–40)
Albumin: 4.7 g/dL (ref 3.9–4.9)
Alkaline Phosphatase: 38 IU/L — ABNORMAL LOW (ref 41–116)
BUN/Creatinine Ratio: 22 (ref 9–23)
BUN: 18 mg/dL (ref 6–24)
Bilirubin Total: 0.5 mg/dL (ref 0.0–1.2)
CO2: 20 mmol/L (ref 20–29)
Calcium: 9.6 mg/dL (ref 8.7–10.2)
Chloride: 107 mmol/L — ABNORMAL HIGH (ref 96–106)
Creatinine, Ser: 0.82 mg/dL (ref 0.57–1.00)
Globulin, Total: 1.9 g/dL (ref 1.5–4.5)
Glucose: 95 mg/dL (ref 70–99)
Potassium: 4.1 mmol/L (ref 3.5–5.2)
Sodium: 139 mmol/L (ref 134–144)
Total Protein: 6.6 g/dL (ref 6.0–8.5)
eGFR: 93 mL/min/1.73

## 2024-02-03 LAB — CBC WITH DIFFERENTIAL/PLATELET
Basophils Absolute: 0 x10E3/uL (ref 0.0–0.2)
Basos: 1 %
EOS (ABSOLUTE): 0 x10E3/uL (ref 0.0–0.4)
Eos: 1 %
Hematocrit: 40 % (ref 34.0–46.6)
Hemoglobin: 12.6 g/dL (ref 11.1–15.9)
Immature Grans (Abs): 0 x10E3/uL (ref 0.0–0.1)
Immature Granulocytes: 0 %
Lymphocytes Absolute: 2.3 x10E3/uL (ref 0.7–3.1)
Lymphs: 41 %
MCH: 32 pg (ref 26.6–33.0)
MCHC: 31.5 g/dL (ref 31.5–35.7)
MCV: 102 fL — ABNORMAL HIGH (ref 79–97)
Monocytes Absolute: 0.4 x10E3/uL (ref 0.1–0.9)
Monocytes: 7 %
Neutrophils Absolute: 2.8 x10E3/uL (ref 1.4–7.0)
Neutrophils: 50 %
Platelets: 323 x10E3/uL (ref 150–450)
RBC: 3.94 x10E6/uL (ref 3.77–5.28)
RDW: 11.8 % (ref 11.7–15.4)
WBC: 5.6 x10E3/uL (ref 3.4–10.8)

## 2024-02-03 LAB — VITAMIN D 25 HYDROXY (VIT D DEFICIENCY, FRACTURES): Vit D, 25-Hydroxy: 38.8 ng/mL (ref 30.0–100.0)

## 2024-02-03 LAB — PHOSPHORUS: Phosphorus: 2.8 mg/dL — ABNORMAL LOW (ref 3.0–4.3)

## 2024-02-03 LAB — TSH: TSH: 1.02 u[IU]/mL (ref 0.450–4.500)

## 2024-02-03 LAB — MAGNESIUM: Magnesium: 2.3 mg/dL (ref 1.6–2.3)

## 2024-02-04 ENCOUNTER — Ambulatory Visit: Payer: Self-pay | Admitting: Family Medicine

## 2024-02-06 ENCOUNTER — Ambulatory Visit: Admitting: Family Medicine

## 2024-02-20 ENCOUNTER — Encounter: Payer: Self-pay | Admitting: Podiatry

## 2024-02-21 ENCOUNTER — Other Ambulatory Visit: Payer: Self-pay | Admitting: Podiatry

## 2024-02-21 MED ORDER — DOXYCYCLINE HYCLATE 100 MG PO TABS: 100.0000 mg | ORAL_TABLET | Freq: Two times a day (BID) | ORAL | 1 refills | Status: AC

## 2024-02-26 ENCOUNTER — Ambulatory Visit: Admitting: Family Medicine

## 2024-02-29 ENCOUNTER — Ambulatory Visit: Admitting: Student in an Organized Health Care Education/Training Program

## 2024-03-12 ENCOUNTER — Ambulatory Visit: Admitting: Family Medicine

## 2024-03-19 ENCOUNTER — Ambulatory Visit: Admitting: Family Medicine

## 2024-11-21 ENCOUNTER — Telehealth: Admitting: Neurology
